# Patient Record
Sex: Male | Born: 1935 | Race: White | Hispanic: No | Smoking: Never smoker
Health system: Southern US, Community
[De-identification: ages and names within clinical notes are randomized; demographics above are authoritative.]

## PROBLEM LIST (undated history)

## (undated) DIAGNOSIS — I2510 Atherosclerotic heart disease of native coronary artery without angina pectoris: Secondary | ICD-10-CM

## (undated) DIAGNOSIS — F319 Bipolar disorder, unspecified: Secondary | ICD-10-CM

## (undated) DIAGNOSIS — F32A Depression, unspecified: Secondary | ICD-10-CM

## (undated) DIAGNOSIS — I10 Essential (primary) hypertension: Secondary | ICD-10-CM

## (undated) DIAGNOSIS — H269 Unspecified cataract: Secondary | ICD-10-CM

## (undated) DIAGNOSIS — G20A1 Parkinson's disease without dyskinesia, without mention of fluctuations: Secondary | ICD-10-CM

## (undated) DIAGNOSIS — I714 Abdominal aortic aneurysm, without rupture, unspecified: Secondary | ICD-10-CM

## (undated) DIAGNOSIS — G20 Parkinson's disease: Secondary | ICD-10-CM

## (undated) DIAGNOSIS — F329 Major depressive disorder, single episode, unspecified: Secondary | ICD-10-CM

## (undated) DIAGNOSIS — F419 Anxiety disorder, unspecified: Secondary | ICD-10-CM

## (undated) DIAGNOSIS — K219 Gastro-esophageal reflux disease without esophagitis: Secondary | ICD-10-CM

## (undated) DIAGNOSIS — W19XXXA Unspecified fall, initial encounter: Secondary | ICD-10-CM

## (undated) DIAGNOSIS — G309 Alzheimer's disease, unspecified: Secondary | ICD-10-CM

## (undated) DIAGNOSIS — E785 Hyperlipidemia, unspecified: Secondary | ICD-10-CM

## (undated) DIAGNOSIS — F0280 Dementia in other diseases classified elsewhere without behavioral disturbance: Secondary | ICD-10-CM

## (undated) HISTORY — PX: CATARACT EXTRACTION: SUR2

---

## 2009-12-26 ENCOUNTER — Emergency Department (HOSPITAL_BASED_OUTPATIENT_CLINIC_OR_DEPARTMENT_OTHER): Admission: EM | Admit: 2009-12-26 | Discharge: 2009-12-26 | Payer: Self-pay | Admitting: Emergency Medicine

## 2009-12-28 ENCOUNTER — Emergency Department (HOSPITAL_BASED_OUTPATIENT_CLINIC_OR_DEPARTMENT_OTHER): Admission: EM | Admit: 2009-12-28 | Discharge: 2009-12-28 | Payer: Self-pay | Admitting: Emergency Medicine

## 2009-12-28 ENCOUNTER — Ambulatory Visit: Payer: Self-pay | Admitting: Diagnostic Radiology

## 2010-12-16 ENCOUNTER — Observation Stay (HOSPITAL_COMMUNITY)
Admission: EM | Admit: 2010-12-16 | Discharge: 2010-12-18 | Disposition: A | Discharge status: Skilled Nursing Facility | Payer: Medicare Other | Attending: Internal Medicine | Admitting: Internal Medicine

## 2010-12-16 ENCOUNTER — Emergency Department (HOSPITAL_COMMUNITY): Payer: Medicare Other

## 2010-12-16 DIAGNOSIS — F0390 Unspecified dementia without behavioral disturbance: Secondary | ICD-10-CM | POA: Insufficient documentation

## 2010-12-16 DIAGNOSIS — N400 Enlarged prostate without lower urinary tract symptoms: Secondary | ICD-10-CM | POA: Insufficient documentation

## 2010-12-16 DIAGNOSIS — M6281 Muscle weakness (generalized): Secondary | ICD-10-CM | POA: Insufficient documentation

## 2010-12-16 DIAGNOSIS — F411 Generalized anxiety disorder: Secondary | ICD-10-CM | POA: Insufficient documentation

## 2010-12-16 DIAGNOSIS — E785 Hyperlipidemia, unspecified: Secondary | ICD-10-CM | POA: Insufficient documentation

## 2010-12-16 DIAGNOSIS — R079 Chest pain, unspecified: Principal | ICD-10-CM | POA: Insufficient documentation

## 2010-12-16 DIAGNOSIS — R9431 Abnormal electrocardiogram [ECG] [EKG]: Secondary | ICD-10-CM | POA: Insufficient documentation

## 2010-12-16 DIAGNOSIS — F259 Schizoaffective disorder, unspecified: Secondary | ICD-10-CM | POA: Insufficient documentation

## 2010-12-16 DIAGNOSIS — G20A1 Parkinson's disease without dyskinesia, without mention of fluctuations: Secondary | ICD-10-CM | POA: Insufficient documentation

## 2010-12-16 DIAGNOSIS — E781 Pure hyperglyceridemia: Secondary | ICD-10-CM | POA: Insufficient documentation

## 2010-12-16 DIAGNOSIS — R5381 Other malaise: Secondary | ICD-10-CM | POA: Insufficient documentation

## 2010-12-16 DIAGNOSIS — R7309 Other abnormal glucose: Secondary | ICD-10-CM | POA: Insufficient documentation

## 2010-12-16 DIAGNOSIS — G20 Parkinson's disease: Secondary | ICD-10-CM | POA: Insufficient documentation

## 2010-12-16 DIAGNOSIS — K2270 Barrett's esophagus without dysplasia: Secondary | ICD-10-CM | POA: Insufficient documentation

## 2010-12-16 DIAGNOSIS — Z79899 Other long term (current) drug therapy: Secondary | ICD-10-CM | POA: Insufficient documentation

## 2010-12-16 DIAGNOSIS — I10 Essential (primary) hypertension: Secondary | ICD-10-CM | POA: Insufficient documentation

## 2010-12-16 LAB — DIFFERENTIAL
Lymphs Abs: 2.6 10*3/uL (ref 0.7–4.0)
Neutro Abs: 3.5 10*3/uL (ref 1.7–7.7)
Neutrophils Relative %: 51 % (ref 43–77)

## 2010-12-16 LAB — COMPREHENSIVE METABOLIC PANEL
AST: 16 U/L (ref 0–37)
Albumin: 3.6 g/dL (ref 3.5–5.2)
CO2: 27 mEq/L (ref 19–32)
Calcium: 10.1 mg/dL (ref 8.4–10.5)
GFR calc non Af Amer: >60 mL/min (ref >60)
Potassium: 4 mEq/L (ref 3.5–5.1)
Sodium: 140 mEq/L (ref 135–145)

## 2010-12-16 LAB — CK TOTAL AND CKMB (NOT AT ARMC)
Relative Index: INVALID (ref 0.0–2.5)
Relative Index: INVALID (ref 0.0–2.5)
Total CK: 26 U/L (ref 7–232)
Total CK: 26 U/L (ref 7–232)

## 2010-12-16 LAB — CBC
HCT: 40.2 % (ref 39.0–52.0)
Hemoglobin: 13.6 g/dL (ref 13.0–17.0)
MCH: 32.3 pg (ref 26.0–34.0)
MCV: 95.5 fL (ref 78.0–100.0)
Platelets: 149 10*3/uL — ABNORMAL LOW (ref 150–400)
RDW: 13.7 % (ref 11.5–15.5)

## 2010-12-16 LAB — CARDIAC PANEL(CRET KIN+CKTOT+MB+TROPI): CK, MB: 1.8 ng/mL (ref 0.3–4.0)

## 2010-12-16 LAB — URINALYSIS, ROUTINE W REFLEX MICROSCOPIC
Glucose, UA: NEGATIVE mg/dL
Nitrite: NEGATIVE
Protein, ur: NEGATIVE mg/dL

## 2010-12-16 LAB — TROPONIN I: Troponin I: <0.3 ng/mL (ref <0.30)

## 2010-12-17 LAB — BASIC METABOLIC PANEL
BUN: 21 mg/dL (ref 6–23)
CO2: 26 mEq/L (ref 19–32)
Creatinine, Ser: 0.92 mg/dL (ref 0.50–1.35)
GFR calc Af Amer: >60 mL/min (ref >60)

## 2010-12-17 LAB — DIFFERENTIAL
Basophils Relative: 0 % (ref 0–1)
Monocytes Absolute: 0.4 10*3/uL (ref 0.1–1.0)
Monocytes Relative: 7 % (ref 3–12)
Neutro Abs: 3.3 10*3/uL (ref 1.7–7.7)
Neutrophils Relative %: 50 % (ref 43–77)

## 2010-12-17 LAB — HEMOGLOBIN A1C
Hgb A1c MFr Bld: 6 % — ABNORMAL HIGH (ref <5.7)
Mean Plasma Glucose: 126 mg/dL — ABNORMAL HIGH (ref <117)

## 2010-12-17 LAB — LIPID PANEL
HDL: 33 mg/dL — ABNORMAL LOW (ref >39)
LDL Cholesterol: 124 mg/dL — ABNORMAL HIGH (ref 0–99)
Total CHOL/HDL Ratio: 6.3 RATIO
Triglycerides: 258 mg/dL — ABNORMAL HIGH (ref <150)
VLDL: 52 mg/dL — ABNORMAL HIGH (ref 0–40)

## 2010-12-17 LAB — CBC
HCT: 38 % — ABNORMAL LOW (ref 39.0–52.0)
Hemoglobin: 12.8 g/dL — ABNORMAL LOW (ref 13.0–17.0)
MCH: 32.6 pg (ref 26.0–34.0)
MCV: 96.7 fL (ref 78.0–100.0)
RBC: 3.93 MIL/uL — ABNORMAL LOW (ref 4.22–5.81)
RDW: 13.9 % (ref 11.5–15.5)
WBC: 6.7 10*3/uL (ref 4.0–10.5)

## 2010-12-17 LAB — CARDIAC PANEL(CRET KIN+CKTOT+MB+TROPI)
Relative Index: INVALID (ref 0.0–2.5)
Total CK: 19 U/L (ref 7–232)
Troponin I: <0.3 ng/mL (ref <0.30)

## 2010-12-17 NOTE — H&P (Signed)
NAME:  Andre Marsh, Andre Marsh NO.:  [id number]  MEDICAL RECORD NO.:  [id number]  LOCATION:  1431                         FACILITY:  [facility]  PHYSICIAN:  Jeoffrey Massed, MD    DATE OF BIRTH:  18-Feb-1927  DATE OF ADMISSION:  12/16/2010 DATE OF DISCHARGE:                             HISTORY & PHYSICAL   PRIMARY CARE PRACTITIONER:  At assisted living facility is Karlene Einstein, MD  CHIEF COMPLAINT:  Intermittent chest pain for the past 2 weeks.  HISTORY OF PRESENT ILLNESS:  The patient is an 77 year old white male with a past medical history of Barrett esophagus, hypertension, Parkinson disease, schizoaffective disorder who has been having intermittent chest pain for the past 2 weeks.  Please note that this patient does have some mild to moderate dementia and history is unreliable, but most of this history is obtained after talking to the patient's daughter and also from the ED chart.  Per the history obtained, apparently the patient has been having chest pain on an intermittent basis as noted above.  Upon repeatedly asking the patient and the patient's daughter, it is unclear whether this is actually related to exertion or happens at rest.  Per the patient, there are times when he has had chest pain on rest and there are times when he claims he just feels tired and is slightly winded after exertion.  Upon repeatedly asking, the patient claims that his chest pain is on the left side of his chest.  At its worst, it is around 3/10 to 5/10 with no radiation and with no associated nausea, vomiting, palpitations or diaphoresis.  The last time the patient had chest pain was yesterday and he is currently chest pain-free.  The daughter has been trying to get the patient to seek medical attention for it for the past 2 weeks, but the patient has subsequently refused and subsequently agreed today and was then brought to the ED for further evaluation.  The patient  denies headache, blurry vision, shortness of breath at rest, abdominal pain, nausea, vomiting, diarrhea or dysuria.  ALLERGIES:  Apparently, the patient is allergic to TETRACYCLINE, PENICILLIN, CIPROFLOXACIN, CODEINE and SULFA medications.  PAST MEDICAL HISTORY:  Significant for: 1. 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 2. Schizoaffective disorder. 3. Anxiety. 4. Dyslipidemia. 5. Hypertension. 6. Parkinson disease. 7. Barrett esophagus. 8. BPH.  PAST SURGICAL HISTORY:  Cholecystectomy.  MEDICATIONS PRIOR TO ADMISSION: 1. Zolpidem 5 mg 1 tablet q.h.s. p.r.n. 2. Lorazepam 0.5 mg 1 tablet every 12 hours p.r.n. 3. Albuterol nebulizer one nebulizer inhaled q.4 h p.r.n. 4. Terazosin 5 mg 1 capsule at bedtime. 5. Senokot 1 tablet twice daily. 6. Risperdal 0.25 mg 1 tablet twice daily. 7. Omeprazole 20 mg 1 tablet twice daily. 8. Mirtazapine 7.5 mg 1 tablet daily at bedtime. 9. Metoprolol-XL 25 mg 1 tablet daily. 10.Lamictal 100 mg 1 tablet twice daily. 11.Galantamine 8 mg 1 capsule daily. 12.Folic acid 1 mg 1 tablet daily. 13.Budesonide 3 mg oral 3 capsules daily.  FAMILY HISTORY:  Noncontributory.  However, the patient's mother did apparently have congestive heart failure.  SOCIAL HISTORY:  Lives at Assisted Living Facility and denies any toxic habits.  REVIEW OF SYSTEMS:  A detailed review of 12 systems was done and these are negative except for the ones noted in the HPI.  PHYSICAL EXAMINATION:  GENERAL:  Lying in bed, does not appear to be in any distress.  He is awake and mostly alert.  Speech is clear.  Follows most of my commands and answers most of my questions appropriately. VITAL SIGNS:  Temperature of 98.4, heart rate of 69, blood pressure of 119/69, respirations of 20. HEENT:  Atraumatic, normocephalic.  Pupils equally react to light and accommodation. NECK:  Supple. CHEST:  Bilaterally clear to auscultation. CARDIOVASCULAR:  Heart sounds are regular.  No  murmurs heard. ABDOMEN:  Soft, nontender, nondistended. EXTREMITIES:  Show no edema. NEUROLOGIC:  The patient is awake and alert and does not appear to have any focal neurological deficits.  LABORATORY DATA: 1. CBC shows a WBC of 6.8, hemoglobin 13.6, hematocrit of 40.2 and     platelet count of 149. 2. Urinalysis is unremarkable. 3. First set of troponins is negative. 4. Chemistries showed a sodium of 140, potassium of 4.0, chloride of     103, bicarb of 27, glucose of 88, BUN of 18, creatinine of 0.84 and     a calcium of 10.1. 5. LFTs show a total bilirubin of 0.3, alkaline phosphatase of 77, AST     of 16, ALT of 15, total protein of 6.6 and albumin of 3.6.  RADIOLOGICAL STUDIES: 1. A chest x-ray 2-view showed no acute cardiopulmonary abnormalities.     Bilateral lower lobe reticular interstitial markings suggesting     pulmonary fibrosis. 2. EKG shows normal sinus rhythm with some nonspecific ST changes.  ASSESSMENT: 1. Chest pain, rule out unstable angina versus secondary to Barrett     esophagitis or reflux esophagitis. 2. Mild-to-moderate dementia, currently at baseline. 3. History of Parkinson disease. 4. History of hypertension, currently stable. 5. History of schizoaffective disorder, currently stable.  PLAN: 1. The patient will be brought in as an observation and will be     admitted to the telemetry unit. 2. Cardiac enzymes will be cycled. 3. The patient will be based on aspirin. 4. A fasting lipid profile will be obtained and an Hb A1c as well. 5. A 2-D echocardiogram will be obtained. 6. Further plan will depend as the patient's clinical course evolves. 7. Long discussions were done with the patient, but mainly with the     daughter and given the patient's numerous underlying medical     comorbidities, she would prefer medical management unless something     was very grossly abnormal on the echo or if cardiac enzymes were     strongly positive.  In that case,  she would probably prefer     cardiology evaluation for further discussion before embarking on an     elaborate workup. We will admit this patient and follow his clinical      course and make further plans depending on that. 8. All of his usual medications will be continued.  Please see orders     as written. 9. Code status.  A long discussion was also held and for now, the     patient will be a full code and the daughter claims that he does     have a living will and advance directives in place and will bring     it to the hospital for further clarification.  TIME SPENT:  Total time spent coordinating admission process was 45 minutes.     Telesha Deguzman  Jerral Ralph, MD     SG/MEDQ  D:  12/16/2010  T:  12/16/2010  Job:  [id number]  cc:   Karlene Einstein, M.D. Fax: [phone]  Electronically Signed by Jeoffrey Massed  on 12/17/2010 03:32:03 PM

## 2011-01-02 NOTE — Discharge Summary (Signed)
NAMEMarland Marsh  [name], [name]:  [id number]  MEDICAL RECORD NO.:  [id number]  LOCATION:  1431                         FACILITY:  [facility]  PHYSICIAN:  Ladell Pier, M.D.   DATE OF BIRTH:  1926-12-12  DATE OF ADMISSION:  12/16/2010 DATE OF DISCHARGE:  12/18/2010                              DISCHARGE SUMMARY   DISCHARGE DIAGNOSES: 1. Chest pain with negative cardiac markers.  Negative 2-D echo.  The     patient to follow up outpatient with cardiology and with primary     care physician. 2. Mild to moderate dementia. 3. Schizoaffective disorder. 4. Anxiety. 5. Dyslipidemia. 6. Hypertension. 7. Parkinson's disease. 8. Barrett's esophagus. 9. Enlarged prostate - benign prostatic hypertrophy. 10.Glucose intolerance with hemoglobin A1c of 6.0. 11.Hypertriglyceridemia.  DISCHARGE MEDICATIONS: 1. Aspirin 325 mg daily. 2. Albuterol nebulizer every 4 hours as needed for shortness of     breath. 3. Budesonide 3 mg 3 capsules daily. 4. Folic acid 1 mg daily. 5. Galantamine 8 mg daily. 6. Lamictal 100 mg one at 10:00 a.m. and one at 2:00 p.m. 7. Lorazepam 0.5 mg every 12 hours as needed. 8. Metoprolol XL 25 mg daily. 9. Mirtazapine 7.5 mg at bedtime. 10.Omeprazole 20 mg twice daily. 11.Risperidone 0.25 mg, take one at 8:00 a.m. and one at 8:00 p.m. 12.Senokot/docusate 8.6/50, take one at 8:00 a.m. and one at 8:00 p.m. 13.Terazosin 5 mg at bedtime. 14.Ambien 5 mg q.h.s. p.r.n.  FOLLOWUP APPOINTMENTS:  The patient to follow up with Dr. Kerry Dory in 1 to 2 weeks.  The patient again scheduled outpatient followup with cardiology.  PROCEDURES:  2-D echo done on July 3 showed cavity size normal, moderate LVH, systolic function was normal in the range of 55% to 60%.  Wall motion was normal.  No regional wall motion abnormalities.  Grade 1 diastolic dysfunction.  Chest x-ray, no acute cardiopulmonary abnormalities.  Bilateral lower lobe reticular interstitial  markings, suggesting pulmonary fibrosis.  CONSULTANTS:  None.  HISTORY OF PRESENT ILLNESS:  Please see dictated H&P.  The patient is an 77 year old white male with past medical history significant for Barrett's esophagus, hypertension, Parkinson's disease, schizoaffective disorder.  The patient complained of intermittent chest pain for 2 weeks.  The patient states that sometimes the pain is with activity and sometimes is at rest.  He complains of feeling tired and slightly winded after exertion sometimes.  The patient on admission was chest pain-free. He was admitted for rule out MI.  PAST MEDICAL HISTORY:  Per admission H&P.  FAMILY HISTORY:  Per admission H&P.  SOCIAL HISTORY:  Per admission H&P.  MEDICATIONS:  Per admission H&P.  ALLERGIES:  Per admission H&P.  REVIEW OF SYSTEMS:  Per admission H&P.  PHYSICAL EXAMINATION AT THE TIME OF DISCHARGE:  VITAL SIGNS: Temperature 97.8, pulse 57, respirations 16 blood pressure 152/74, pulse ox 96% on room air. GENERAL:  The patient is lying in bed, well-nourished white male. HEENT:  Normocephalic, atraumatic.  Pupils reactive to light without erythema. CARDIOVASCULAR:  Regular rate and rhythm. LUNGS:  Clear bilaterally. ABDOMEN:  Positive bowel sounds. EXTREMITIES:  No edema.  HOSPITAL COURSE: 1. Chest pain:  The patient was admitted with  chest pain ruled out.     Cardiac markers are negative.  His 2-D echo does not show any wall     motion abnormality.  The patient is presently chest pain-free.  He     will be discharged home.  He can follow up outpatient with     cardiology and with his primary care physician. 2. Hypertension.  Continue home meds. 3. Schizoaffective disorder.  Continue home meds. 4. High triglycerides:  The patient can be started on fish oil when he     follows up at the nursing home but will defer that to primary care     physician.  DISCHARGE LABS:  Hemoglobin A1c 6.0.  Plasma glucose 126,  total cholesterol of 209, triglycerides 258, HDL 33, LDL of 124.  CK 19, MB 1.5, troponin less than 0.03.  Sodium 134, potassium 4.1, chloride 102, CO2 26, glucose 102, BUN 21, creatinine 0.92.  WBC 6.7, hemoglobin 12.8, MCV 96.7, platelets 157,000.  Time spent with the patient and doing this discharge is approximately 45 minutes.     Ladell Pier, M.D.     [place]/MEDQ  D:  12/18/2010  T:  12/18/2010  Job:  [id number]  Electronically Signed by Ladell Pier M.D. on 01/02/2011 10:59:43 PM

## 2012-09-02 ENCOUNTER — Non-Acute Institutional Stay (SKILLED_NURSING_FACILITY): Payer: Medicare Other | Admitting: Internal Medicine

## 2012-09-02 DIAGNOSIS — R0600 Dyspnea, unspecified: Secondary | ICD-10-CM

## 2012-09-02 DIAGNOSIS — I10 Essential (primary) hypertension: Secondary | ICD-10-CM

## 2012-09-02 DIAGNOSIS — R079 Chest pain, unspecified: Secondary | ICD-10-CM

## 2012-09-02 DIAGNOSIS — R0989 Other specified symptoms and signs involving the circulatory and respiratory systems: Secondary | ICD-10-CM

## 2012-09-06 NOTE — Progress Notes (Signed)
Patient ID: Andre Marsh, male   DOB: 03-Mar-1927, 79 y.o.   MRN: [id number]           PROGRESS NOTE  DATE:  09/02/2012  FACILITY: [name]   LEVEL OF CARE: SNF  Acute Visit  CHIEF COMPLAINT:  Manage chest pain.    HISTORY OF PRESENT ILLNESS: I was requested by the patient to assess him secondary to complaints of chest pain and shortness of breath.  Per patient, symptoms have been present for several days constantly, 24 hours/7 days a week.  He denies diaphoresis, nausea or vomiting.  The patient had a cardiac catheterization in October 2013.    REVIEW OF SYSTEMS: GENERAL: no change in appetite, no fatigue, no weight changes, no fever, chills or weakness RESPIRATORY: see HPI CARDIAC: see HPI GI: no abdominal pain, diarrhea, constipation, heart burn, nausea or vomiting  PHYSICAL EXAMINATION  VS:  T  97.6       P  58           RR  18         BP  156/76             POX %                WT (Lb)  GENERAL: no acute distress, normal body habitus RESPIRATORY: breathing is even & unlabored, BS CTAB CARDIAC: RRR, no murmur,no extra heart sounds, no edema GI: abdomen soft, normal BS, no masses, no tenderness, no hepatomegaly, no splenomegaly NECK/THYROID:   Supple.  No elevation of the jugular venous pulsation.  Trachea midline.  No neck masses.  No thyroid tenderness.  No thyroid nodule.  No thyroid enlargement.   ASSESSMENT/PLAN:  Chest pain and shortness of breath (            ).  Not sure symptoms are real.  Patient has frequent chest pain complaints.  In October 2013, he had a cardiac catheterization.  At that time, his nuclear stress test was negative for ischemia.  Therefore, further workup was not recommended.  We will give Mylanta 30 cc and nitroglycerin 0.6 mg one-time dose.    Hypertension 401.1.  Blood pressure is elevated now, likely secondary to anxiety.  Overall, blood pressures are normal.      CPT CODE: 40981.

## 2012-09-09 ENCOUNTER — Non-Acute Institutional Stay (SKILLED_NURSING_FACILITY): Payer: Medicare Other | Admitting: Internal Medicine

## 2012-09-09 DIAGNOSIS — K5900 Constipation, unspecified: Secondary | ICD-10-CM

## 2012-09-09 DIAGNOSIS — R079 Chest pain, unspecified: Secondary | ICD-10-CM

## 2012-09-15 NOTE — Progress Notes (Signed)
Patient ID: Andre Marsh, male   DOB: [birth year], 79 y.o.   MRN: [id number]        PROGRESS NOTE  DATE:   09/09/2012  FACILITY:  [name]   LEVEL OF CARE: SNF  Acute Visit  CHIEF COMPLAINT:  Manage chest pain and constipation.   HISTORY OF PRESENT ILLNESS: I was requested by the staff to assess the patient regarding above problem(s):    Chest pain and shortness of breath.  Patient is complaining of ongoing chest pain and shortness of breath and insists on a Cardiology consultation.   In October 2013, patient had a Cardiology consult for complaints of chest pain and shortness of breath.  At that time, nuclear scan was negative for ischemia and cardiologist did not recommend further workup.  Constipation.   Staff requested that patient be seen because he was requesting an increase in stool softener.  He had reported that he was having a difficult time with passing his stool.   Today patient denies constipation, stating he increased fiber in his diet.    PAST MEDICAL HISTORY : Reviewed.  No changes.  CURRENT MEDICATIONS: Reviewed per [facility]  REVIEW OF SYSTEMS:  GENERAL: no change in appetite, no fatigue, no weight changes, no fever, chills or weakness RESPIRATORY: see HPI, no cough, DOE, wheezing, hemoptysis CARDIAC: see HPI, no edema or palpitations GI: no abdominal pain, diarrhea, constipation, heart burn, nausea or vomiting  PHYSICAL EXAMINATION  GENERAL: no acute distress, normal body habitus RESPIRATORY: breathing is even & unlabored, BS CTAB CARDIAC: RRR, no murmur,no extra heart sounds, no edema GI: abdomen soft, normal BS, no masses, no tenderness, no hepatomegaly, no splenomegaly PSYCHIATRIC: the patient is alert & oriented to person, affect & behavior appropriate   ASSESSMENT/PLAN:  Chest pain and shortness of breath.  Recurrent complaint, likely psychosomatic.  Patient insists on a Cardiology consult.  Therefore, we will refer.    Constipation.   Apparently no further  concerns.   CPT CODE: 40981.

## 2012-09-24 ENCOUNTER — Other Ambulatory Visit: Payer: Self-pay | Admitting: *Deleted

## 2012-09-24 MED ORDER — ZOLPIDEM TARTRATE 5 MG PO TABS: ORAL_TABLET | ORAL | Status: DC

## 2012-10-07 ENCOUNTER — Non-Acute Institutional Stay (SKILLED_NURSING_FACILITY): Payer: Medicare Other | Admitting: Internal Medicine

## 2012-10-07 DIAGNOSIS — E7800 Pure hypercholesterolemia, unspecified: Secondary | ICD-10-CM

## 2012-10-07 DIAGNOSIS — I10 Essential (primary) hypertension: Secondary | ICD-10-CM

## 2012-10-07 DIAGNOSIS — J309 Allergic rhinitis, unspecified: Secondary | ICD-10-CM

## 2012-10-07 DIAGNOSIS — K5900 Constipation, unspecified: Secondary | ICD-10-CM

## 2012-10-22 DIAGNOSIS — K5900 Constipation, unspecified: Secondary | ICD-10-CM | POA: Insufficient documentation

## 2012-10-22 DIAGNOSIS — J309 Allergic rhinitis, unspecified: Secondary | ICD-10-CM | POA: Insufficient documentation

## 2012-10-22 DIAGNOSIS — I10 Essential (primary) hypertension: Secondary | ICD-10-CM | POA: Insufficient documentation

## 2012-10-22 DIAGNOSIS — E7800 Pure hypercholesterolemia, unspecified: Secondary | ICD-10-CM | POA: Insufficient documentation

## 2012-10-22 NOTE — Progress Notes (Signed)
Patient ID: Andre Marsh, male   DOB: 05-01-1927, 79 y.o.   MRN: [id number]        PROGRESS NOTE  DATE:  10/07/2012  FACILITY: [name]   LEVEL OF CARE: SNF  Routine Visit  CHIEF COMPLAINT:  Manage constipation, hyperlipidemia and hypertension.  HISTORY OF PRESENT ILLNESS:  REASSESSMENT OF ONGOING PROBLEM(S):  CONSTIPATION: The patient is requesting a stool softener despite being on senna.  The constipation remains stable. No complications from the medications presently being used. Patient denies ongoing constipation, abdominal pain, nausea or vomiting.   HTN: Pt 's HTN remains stable.  Denies CP, sob, DOE, pedal edema, headaches, dizziness or visual disturbances.  No complications from the medications currently being used.  Last BP : 125/69, 132/68, 141/66.  HYPERLIPIDEMIA: No complications from the medications presently being used. Last fasting lipid panel showed : On 09/30/2012:  Total cholesterol 202, LDL 132, triglycerides 141, HDL 42.  Patient does not have a history of coronary artery disease and cardiac work-up was negative in October 2013.   PAST MEDICAL HISTORY : Reviewed.  No changes.  CURRENT MEDICATIONS: Reviewed per [facility]  REVIEW OF SYSTEMS:  GENERAL: no change in appetite, no fatigue, no weight changes, no fever, chills or weakness RESPIRATORY: no cough, SOB, DOE, wheezing, hemoptysis CARDIAC: no chest pain, edema or palpitations GI: complains of uncontrolled constipation; no abdominal pain, diarrhea, heart burn, nausea or vomiting  PHYSICAL EXAMINATION  VS:  T 98.5     P 57     RR 16    BP 125/69     POX %     WT (Lb) 129.4  GENERAL: no acute distress, normal body habitus EYES: conjunctivae normal, sclerae normal, normal eye lids NECK: supple, trachea midline, no neck masses, no thyroid tenderness, no thyromegaly LYMPHATICS: no LAN in the neck, no supraclavicular LAN RESPIRATORY: breathing is even & unlabored, BS CTAB CARDIAC: RRR, no murmur,no extra heart  sounds, no edema GI: abdomen soft, normal BS, no masses, no tenderness, no hepatomegaly, no splenomegaly PSYCHIATRIC: the patient is alert & oriented to person, affect & behavior appropriate  LABS/RADIOLOGY: 09/2012:   TSH 3.376.  07/2012:  Total protein 5.2, otherwise liver profile normal.    05/2012:  Vitamin B12 level 333, folate greater than 20.    Hemoglobin A1C 6.    04/2012:  Iron panel normal.   CBC normal.   BMP normal.   ASSESSMENT/PLAN:  Constipation.  Per patient, uncontrolled.  Add colace 100 mg q.d.   Hyperlipidemia.  LDL unchanged from previous and hypertension is the only coronary risk factor.  Therefore, no need to add a statin.    Hypertension.  Well controlled.   Allergic rhinitis.  No complaints.   Alzheimer's dementia.  Stable.   BPH.  No complaints.    Bipolar disorder.  Continue current medications.  Followed by Psychiatry.    GERD.  Well controlled.    CPT CODE: 82956

## 2012-10-28 ENCOUNTER — Non-Acute Institutional Stay (SKILLED_NURSING_FACILITY): Payer: Medicare Other | Admitting: Internal Medicine

## 2012-10-28 DIAGNOSIS — M25539 Pain in unspecified wrist: Secondary | ICD-10-CM

## 2012-10-28 DIAGNOSIS — M25531 Pain in right wrist: Secondary | ICD-10-CM

## 2012-11-16 ENCOUNTER — Non-Acute Institutional Stay (SKILLED_NURSING_FACILITY): Payer: Medicare Other | Admitting: Internal Medicine

## 2012-11-16 DIAGNOSIS — K5900 Constipation, unspecified: Secondary | ICD-10-CM

## 2012-11-16 DIAGNOSIS — E7800 Pure hypercholesterolemia, unspecified: Secondary | ICD-10-CM

## 2012-11-16 DIAGNOSIS — J309 Allergic rhinitis, unspecified: Secondary | ICD-10-CM

## 2012-11-16 DIAGNOSIS — I10 Essential (primary) hypertension: Secondary | ICD-10-CM

## 2012-11-18 NOTE — Progress Notes (Signed)
Patient ID: Andre Marsh, male   DOB: 1927-02-26, 79 y.o.   MRN: [id number]        PROGRESS NOTE  DATE: 10/28/2012  FACILITY:  [name]  LEVEL OF CARE: SNF (31)  Acute Visit  CHIEF COMPLAINT:  Manage right wrist pain.    HISTORY OF PRESENT ILLNESS: I was requested by the staff to assess the patient regarding above problem(s):  Patient is complaining of right wrist pain today.  He says the pain is constant and there is stiffness in his fingers.  He denies any injury.  He cannot identify precipitating or alleviating factors.  There is no temporal relationship.    PAST MEDICAL HISTORY : Reviewed.  No changes.  CURRENT MEDICATIONS: Reviewed per [facility]  REVIEW OF SYSTEMS:  GENERAL: no change in appetite, no fatigue, no weight changes, no fever, chills or weakness RESPIRATORY: no cough, SOB, DOE,, wheezing, hemoptysis CARDIAC: no chest pain, edema or palpitations GI: no abdominal pain, diarrhea, constipation, heart burn, nausea or vomiting  PHYSICAL EXAMINATION  VS:  T 98.1        P 72       RR 20      BP 120/74     POX %       WT (Lb)  GENERAL: no acute distress, normal body habitus RESPIRATORY: breathing is even & unlabored, BS CTAB CARDIAC: RRR, no murmur,no extra heart sounds, no edema GI: abdomen soft, normal BS, no masses, no tenderness, no hepatomegaly, no splenomegaly MUSCULOSKELETAL:   EXTREMITIES:   RIGHT UPPER EXTREMITY: right wrist is tender to palpation, range of motion testing exacerbates pain; there is no edema, erythema or warmth  ASSESSMENT/PLAN:  Right wrist pain.  New problem.  The patient is not responding to tramadol.  Therefore, we will use Naprosyn 500 mg b.i.d. for 10 days with food.   CPT CODE: 78295

## 2012-11-18 NOTE — Progress Notes (Signed)
Patient ID: Andre Marsh, male   DOB: [birth year], 79 y.o.   MRN: [id number]        PROGRESS NOTE  DATE:  11/16/2012  FACILITY: [name]   LEVEL OF CARE: SNF  Routine Visit  CHIEF COMPLAINT:  Manage constipation, hyperlipidemia and hypertension.  HISTORY OF PRESENT ILLNESS:  REASSESSMENT OF ONGOING PROBLEM(S):  CONSTIPATION:  The constipation remains stable. No complications from the medications presently being used. Patient denies ongoing constipation, abdominal pain, nausea or vomiting.   HTN: Pt 's HTN remains stable.  Denies CP, sob, DOE, pedal edema, headaches, dizziness or visual disturbances.  No complications from the medications currently being used.  Last BP : 159/75, 125/69, 132/68, 141/66.  HYPERLIPIDEMIA: No complications from the medications presently being used. Last fasting lipid panel showed : On 09/30/2012:  Total cholesterol 202, LDL 132, triglycerides 141, HDL 42.  Patient does not have a history of coronary artery disease and cardiac work-up was negative in October 2013.   PAST MEDICAL HISTORY : Reviewed.  No changes.  CURRENT MEDICATIONS: Reviewed per [facility]  REVIEW OF SYSTEMS:  GENERAL: no change in appetite, no fatigue, no weight changes, no fever, chills or weakness RESPIRATORY: no cough, SOB, DOE, wheezing, hemoptysis CARDIAC: no chest pain, edema or palpitations GI: no constipation; no abdominal pain, diarrhea, heart burn, nausea or vomiting  PHYSICAL EXAMINATION  VS:  T 98.2     P 53     RR 16    BP 159/75     POX %     WT (Lb) 128.8  GENERAL: no acute distress, normal body habitus NECK: supple, trachea midline, no neck masses, no thyroid tenderness, no thyromegaly RESPIRATORY: breathing is even & unlabored, BS CTAB CARDIAC: RRR, no murmur,no extra heart sounds, no edema GI: abdomen soft, normal BS, no masses, no tenderness, no hepatomegaly, no splenomegaly PSYCHIATRIC: the patient is alert & oriented to person, affect & behavior  appropriate  LABS/RADIOLOGY: 09/2012:   TSH 3.376.  07/2012:  Total protein 5.2, otherwise liver profile normal.    05/2012:  Vitamin B12 level 333, folate greater than 20.    Hemoglobin A1C 6.    04/2012:  Iron panel normal.   CBC normal.   BMP normal.   ASSESSMENT/PLAN:  Constipation.  Denies ongoing symptoms.  Hyperlipidemia.  LDL unchanged from previous and hypertension is the only coronary risk factor.  Therefore, no need to add a statin.    Hypertension.  Last  BP elevated. We'll review a log.  Allergic rhinitis.  No complaints.   Alzheimer's dementia.  Stable.   BPH.  No complaints.    Bipolar disorder.  Continue current medications.  Followed by Psychiatry.    GERD.  Well controlled.    Check CBC and BMP.  CPT CODE: 40981

## 2012-11-23 ENCOUNTER — Non-Acute Institutional Stay (SKILLED_NURSING_FACILITY): Payer: Medicare Other | Admitting: Internal Medicine

## 2012-11-23 DIAGNOSIS — R739 Hyperglycemia, unspecified: Secondary | ICD-10-CM

## 2012-11-23 DIAGNOSIS — R7309 Other abnormal glucose: Secondary | ICD-10-CM

## 2012-12-13 DIAGNOSIS — R739 Hyperglycemia, unspecified: Secondary | ICD-10-CM | POA: Insufficient documentation

## 2012-12-13 NOTE — Progress Notes (Signed)
Patient ID: Andre Marsh, male   DOB: 30-Jan-1927, 79 y.o.   MRN: [id number]        PROGRESS NOTE  DATE: 11/23/2012  FACILITY:  [name]  LEVEL OF CARE: SNF (31)  Acute Visit  CHIEF COMPLAINT:  Manage hyperglycemia.    HISTORY OF PRESENT ILLNESS: I was requested by the staff to assess the patient regarding above problem(s):  HYPERGLYCEMIA:  On 11/19/2012, patient's glucose level was 139.  He does not have a history of diabetes mellitus, nor is he on prednisone.  He denies polyuria or polydipsia.    PAST MEDICAL HISTORY : Reviewed.  No changes.  CURRENT MEDICATIONS: Reviewed per [facility]  REVIEW OF SYSTEMS:  GENERAL: no change in appetite, no fatigue, no weight changes, no fever, chills or weakness RESPIRATORY: no cough, SOB, DOE,, wheezing, hemoptysis CARDIAC: no chest pain, edema or palpitations GI: no abdominal pain, diarrhea, constipation, heart burn, nausea or vomiting  PHYSICAL EXAMINATION  GENERAL: no acute distress, normal body habitus NECK: supple, trachea midline, no neck masses, no thyroid tenderness, no thyromegaly RESPIRATORY: breathing is even & unlabored, BS CTAB CARDIAC: RRR, no murmur,no extra heart sounds, no edema GI: abdomen soft, normal BS, no masses, no tenderness, no hepatomegaly, no splenomegaly PSYCHIATRIC: the patient is alert & oriented to person, affect & behavior appropriate  ASSESSMENT/PLAN:  Hyperglycemia.  New problem.  Check fasting glucose level and  hemoglobin A1C.    CPT CODE: 40102

## 2013-01-04 ENCOUNTER — Non-Acute Institutional Stay (SKILLED_NURSING_FACILITY): Payer: Medicare Other | Admitting: Internal Medicine

## 2013-01-04 DIAGNOSIS — I10 Essential (primary) hypertension: Secondary | ICD-10-CM

## 2013-01-04 DIAGNOSIS — K5900 Constipation, unspecified: Secondary | ICD-10-CM

## 2013-01-04 DIAGNOSIS — E7800 Pure hypercholesterolemia, unspecified: Secondary | ICD-10-CM

## 2013-01-04 DIAGNOSIS — J309 Allergic rhinitis, unspecified: Secondary | ICD-10-CM

## 2013-01-07 NOTE — Progress Notes (Signed)
Patient ID: OTHA Marsh, male   DOB: 08-26-1926, 79 y.o.   MRN: [id number]        PROGRESS NOTE  DATE:  01/04/2013  FACILITY: [name]   LEVEL OF CARE: SNF  Routine Visit  CHIEF COMPLAINT:  Manage constipation, hyperlipidemia and hypertension.  HISTORY OF PRESENT ILLNESS:  REASSESSMENT OF ONGOING PROBLEM(S):  CONSTIPATION:  The constipation remains stable. No complications from the medications presently being used. Patient denies ongoing constipation, abdominal pain, nausea or vomiting.   HTN: Pt 's HTN remains stable.  Denies CP, sob, DOE, pedal edema, headaches, dizziness or visual disturbances.  No complications from the medications currently being used.  Last BP : 159/75, 125/69, 132/68, 141/66, 100/59.  HYPERLIPIDEMIA: No complications from the medications presently being used. Last fasting lipid panel showed : On 09/30/2012:  Total cholesterol 202, LDL 132, triglycerides 141, HDL 42. In 6/14 total cholesterol 206, triglycerides 198, HDL 36, LDL 130. Patient does not have a history of coronary artery disease and cardiac work-up was negative in October 2013.   PAST MEDICAL HISTORY : Reviewed.  No changes.  CURRENT MEDICATIONS: Reviewed per [facility]  REVIEW OF SYSTEMS:  GENERAL: no change in appetite, no fatigue, no weight changes, no fever, chills or weakness RESPIRATORY: no cough, SOB, DOE, wheezing, hemoptysis CARDIAC: no chest pain, edema or palpitations GI: no constipation; no abdominal pain, diarrhea, heart burn, nausea or vomiting  PHYSICAL EXAMINATION  VS:  T 98.2     P 51    RR 18    BP 100/59     POX %     WT (Lb) 128.8  GENERAL: no acute distress, normal body habitus NECK: supple, trachea midline, no neck masses, no thyroid tenderness, no thyromegaly RESPIRATORY: breathing is even & unlabored, BS CTAB CARDIAC: RRR, no murmur,no extra heart sounds, no edema GI: abdomen soft, normal BS, no masses, no tenderness, no hepatomegaly, no splenomegaly PSYCHIATRIC: the  patient is alert & oriented to person, affect & behavior appropriate  LABS/RADIOLOGY:  6/14 hemoglobin A1c 5.7, CBC normal, glucose 139 otherwise BMP normal, fasting glucose 81 09/2012:   TSH 3.376.  07/2012:  Total protein 5.2, otherwise liver profile normal.    05/2012:  Vitamin B12 level 333, folate greater than 20.    Hemoglobin A1C 6.    04/2012:  Iron panel normal.   CBC normal.   BMP normal.   ASSESSMENT/PLAN:  Constipation.  Denies ongoing symptoms.  Hyperlipidemia.  LDL unchanged from previous and hypertension is the only coronary risk factor.  Therefore, no need to add a statin.    Hypertension.  Well controlled.  Allergic rhinitis.  No complaints.   Alzheimer's dementia.  Stable.   BPH.  No complaints.    Bipolar disorder.  Continue current medications.  Followed by Psychiatry.    GERD.  Well controlled.    CPT CODE: 78295

## 2013-02-04 ENCOUNTER — Non-Acute Institutional Stay (SKILLED_NURSING_FACILITY): Payer: Medicare Other | Admitting: Nurse Practitioner

## 2013-02-04 DIAGNOSIS — K5900 Constipation, unspecified: Secondary | ICD-10-CM

## 2013-02-04 DIAGNOSIS — J309 Allergic rhinitis, unspecified: Secondary | ICD-10-CM

## 2013-02-04 DIAGNOSIS — K219 Gastro-esophageal reflux disease without esophagitis: Secondary | ICD-10-CM

## 2013-02-04 DIAGNOSIS — F319 Bipolar disorder, unspecified: Secondary | ICD-10-CM

## 2013-02-04 DIAGNOSIS — I10 Essential (primary) hypertension: Secondary | ICD-10-CM

## 2013-02-04 NOTE — Progress Notes (Signed)
Patient ID: Andre Marsh, male   DOB: [birth year], 79 y.o.   MRN: [id number]  Nursing Home Location:  [facility]   Place of Service: SNF [phone])  Chief Complaint  Patient presents with  . Medical Managment of Chronic Issues    HPI:  79 year old male who is a long term resident of [facility] is being seen today for routine follow up. Pt without any new complaints and staff without concerns.  REASSESSMENT OF ONGOING PROBLEMS:  MIXED HYPERLIPIDEMIA recent LDL 130 pt not on medications at this time BIPOLAR AFFECTIVE DISORDER  Stable; no complications noted from current medication. No increase in behaviors noted  HYPERTENSION, BENIGN The blood pressure readings taken outside the office since the last visit have been in the target range. However HR remains low CONSTIPATION The symptoms are stable.The medication is well tolerated. BPH W/O URINARY OBSTRUCTION The patient's BPH remains stable.No complications noted from the medication presently being used.  GERD: taking Prilosec reports acid reflux is stable    Review of Systems:   DATA OBTAINED: from patient, nurse, medical record GENERAL: Feels well no fevers, fatigue, appetite changes SKIN: No itching, rash or wounds EYES: No eye pain, redness, discharge EARS: No earache, tinnitus, change in hearing NOSE: No congestion, drainage or bleeding  MOUTH/THROAT: No mouth or tooth pain, No sore throat, No difficulty chewing or swallowing  RESPIRATORY: No cough, wheezing, SOB CARDIAC: ongoing complaints of chest pain-- cardiac workup neg reports it feels like an itch and  tramadol helps,  No palpitations, lower extremity edema  GI: No abdominal pain, No N/V/D or constipation, No heartburn or reflux  GU: No dysuria, frequency or urgency, or incontinence  MUSCULOSKELETAL: No unrelieved bone/joint pain NEUROLOGIC: Awake, alert, appropriate to situation, No change in mental status. Moves all four, no focal deficits PSYCHIATRIC:  No overt anxiety or sadness. Sleeps well. No behavior issue.    Medications: Patient's Medications  New Prescriptions   No medications on file  Previous Medications   ASPIRIN 81 MG CHEWABLE TABLET    Chew 81 mg by mouth daily.   BUDESONIDE (ENTOCORT EC) 3 MG 24 HR CAPSULE    Take 9 mg by mouth every morning.   DOCUSATE SODIUM (COLACE) 100 MG CAPSULE    Take 100 mg by mouth daily.   FOLIC ACID (FOLVITE) 1 MG TABLET    Take 1 mg by mouth daily.   GALANTAMINE (RAZADYNE ER) 8 MG 24 HR CAPSULE    Take 8 mg by mouth daily with breakfast.   HALOPERIDOL (HALDOL) 1 MG TABLET    Take 3 mg by mouth at bedtime.   ISOSORBIDE MONONITRATE (IMDUR) 30 MG 24 HR TABLET    Take 30 mg by mouth daily.   LAMOTRIGINE (LAMICTAL) 100 MG TABLET    Take 100 mg by mouth 2 (two) times daily.   LISINOPRIL (PRINIVIL,ZESTRIL) 20 MG TABLET    Take 20 mg by mouth daily.   LORATADINE (CLARITIN) 10 MG TABLET    Take 10 mg by mouth daily.   METOPROLOL SUCCINATE (TOPROL-XL) 50 MG 24 HR TABLET    Take 50 mg by mouth daily. Take with or immediately following a meal.   OMEPRAZOLE (PRILOSEC) 20 MG CAPSULE    Take 20 mg by mouth 2 (two) times daily.   SENNA-DOCUSATE (SENOKOT-S) 8.6-50 MG PER TABLET    Take 1 tablet by mouth 2 (two) times daily.   TAMSULOSIN (FLOMAX) 0.4 MG CAPS CAPSULE    Take  0.4 mg by mouth.   ZOLPIDEM (AMBIEN) 5 MG TABLET    Take 1/2 tablet at bedtime  Modified Medications   No medications on file  Discontinued Medications   No medications on file     Physical Exam:  Filed Vitals:   02/04/13 1340  BP: 116/70  Pulse: 54  Temp: 97 F (36.1 C)  Resp: 18  Weight: 125 lb (56.7 kg)     GENERAL APPEARANCE: Alert, conversant. Appropriately groomed. No acute distress.  SKIN: No diaphoresis rash, or wounds HEAD: Normocephalic, atraumatic  EYES: Conjunctiva/lids clear. Pupils round, reactive. EOMs intact.  EARS: External exam WNL, canals clear. Hearing grossly normal.  NOSE: No deformity or discharge.   MOUTH/THROAT: Lips w/o lesions. Mouth and throat normal. Tongue moist, w/o lesion.  NECK: No thyroid tenderness, enlargement or nodule  RESPIRATORY: Breathing is even, unlabored. Lung sounds are clear   CARDIOVASCULAR: Heart RRR no murmurs, rubs or gallops. No peripheral edema.  ARTERIAL: radial pulse 2+ GASTROINTESTINAL: Abdomen is soft, non-tender, not distended w/ normal bowel sounds. GENITOURINARY: Bladder non tender, not distended  MUSCULOSKELETAL: No abnormal joints or musculature NEUROLOGIC: . Moves all extremities no tremor. PSYCHIATRIC: Mood and affect appropriate to situation, no behavioral issues  Labs reviewed/Significant Diagnostic Results: Reviewed per chart  Assessment/Plan 1. Essential hypertension, benign Blood pressures are stable and well controlled however heart rate is staying in the low 50s. At this time will decrease metoprolol to 25 mg daily   2. Allergic rhinitis, cause unspecified Allergies are stable at this time  3. Unspecified constipation No complaints of worsening constipation will cont current medications  4. Bipolar disorder, unspecified Stable on current regimen   5. GERD (gastroesophageal reflux disease) Patients acid reflux is stable; continue current regimen. Will monitor and make changes as necessary.

## 2013-02-17 ENCOUNTER — Other Ambulatory Visit: Payer: Self-pay | Admitting: *Deleted

## 2013-02-17 MED ORDER — TRAMADOL HCL 50 MG PO TABS: 50.0000 mg | ORAL_TABLET | Freq: Three times a day (TID) | ORAL | Status: DC | PRN

## 2013-02-23 ENCOUNTER — Non-Acute Institutional Stay (SKILLED_NURSING_FACILITY): Payer: Medicare Other | Admitting: Internal Medicine

## 2013-02-23 DIAGNOSIS — I10 Essential (primary) hypertension: Secondary | ICD-10-CM

## 2013-02-23 DIAGNOSIS — J309 Allergic rhinitis, unspecified: Secondary | ICD-10-CM

## 2013-02-23 DIAGNOSIS — E7800 Pure hypercholesterolemia, unspecified: Secondary | ICD-10-CM

## 2013-02-23 DIAGNOSIS — K5900 Constipation, unspecified: Secondary | ICD-10-CM

## 2013-02-23 NOTE — Progress Notes (Signed)
Patient ID: Andre Marsh, male   DOB: 1927/02/17, 79 y.o.   MRN: [id number]        PROGRESS NOTE  DATE:  02/23/2013  FACILITY: [name]   LEVEL OF CARE: SNF  Routine Visit  CHIEF COMPLAINT:  Manage constipation, hyperlipidemia and hypertension.  HISTORY OF PRESENT ILLNESS:  REASSESSMENT OF ONGOING PROBLEM(S):  CONSTIPATION:  The constipation remains stable. No complications from the medications presently being used. Patient denies ongoing constipation, abdominal pain, nausea or vomiting.   HTN: Pt 's HTN remains stable.  Denies CP, sob, DOE, pedal edema, headaches, dizziness or visual disturbances.  No complications from the medications currently being used.  Last BP : 159/75, 125/69, 132/68, 141/66, 100/59, 115/66.  HYPERLIPIDEMIA: No complications from the medications presently being used. Last fasting lipid panel showed : On 09/30/2012:  Total cholesterol 202, LDL 132, triglycerides 141, HDL 42. In 6/14 total cholesterol 206, triglycerides 198, HDL 36, LDL 130. Patient does not have a history of coronary artery disease and cardiac work-up was negative in October 2013.   PAST MEDICAL HISTORY : Reviewed.  No changes.  CURRENT MEDICATIONS: Reviewed per [facility]  REVIEW OF SYSTEMS:  GENERAL: no change in appetite, no fatigue, no weight changes, no fever, chills or weakness RESPIRATORY: no cough, SOB, DOE, wheezing, hemoptysis CARDIAC: no chest pain, edema or palpitations GI: no constipation; no abdominal pain, diarrhea, heart burn, nausea or vomiting  PHYSICAL EXAMINATION  VS:  T 98.3     P 51    RR 18    BP 115/66     POX %     WT (Lb) 128.2  GENERAL: no acute distress, normal body habitus NECK: supple, trachea midline, no neck masses, no thyroid tenderness, no thyromegaly RESPIRATORY: breathing is even & unlabored, BS CTAB CARDIAC: RRR, no murmur,no extra heart sounds, no edema GI: abdomen soft, normal BS, no masses, no tenderness, no hepatomegaly, no splenomegaly PSYCHIATRIC:  the patient is alert & oriented to person, affect & behavior appropriate  LABS/RADIOLOGY:  6/14 hemoglobin A1c 5.7, CBC normal, glucose 139 otherwise BMP normal, fasting glucose 81 09/2012:   TSH 3.376.  07/2012:  Total protein 5.2, otherwise liver profile normal.    05/2012:  Vitamin B12 level 333, folate greater than 20.    Hemoglobin A1C 6.    04/2012:  Iron panel normal.   CBC normal.   BMP normal.   ASSESSMENT/PLAN:  Constipation.  Denies ongoing symptoms.  Hyperlipidemia.  LDL unchanged from previous and hypertension is the only coronary risk factor.  Therefore, no need to add a statin.    Hypertension.  Well controlled.  Allergic rhinitis.  No complaints.   Alzheimer's dementia.  Stable.   BPH.  No complaints.    Bipolar disorder.  Continue current medications.  Followed by Psychiatry.    GERD.  Well controlled.    CPT CODE: 78295

## 2013-03-01 ENCOUNTER — Other Ambulatory Visit: Payer: Self-pay | Admitting: *Deleted

## 2013-03-01 MED ORDER — TRAMADOL HCL 50 MG PO TABS: ORAL_TABLET | ORAL | Status: DC

## 2013-03-08 ENCOUNTER — Other Ambulatory Visit: Payer: Self-pay

## 2013-03-08 MED ORDER — TRAMADOL HCL 50 MG PO TABS: ORAL_TABLET | ORAL | Status: DC

## 2013-03-08 NOTE — Telephone Encounter (Signed)
Verified dose and instructions reflect manual request received by nursing home.   

## 2013-03-28 ENCOUNTER — Other Ambulatory Visit: Payer: Self-pay | Admitting: *Deleted

## 2013-03-28 MED ORDER — ZOLPIDEM TARTRATE 5 MG PO TABS: ORAL_TABLET | ORAL | Status: DC

## 2013-03-29 ENCOUNTER — Non-Acute Institutional Stay (SKILLED_NURSING_FACILITY): Payer: Medicare Other | Admitting: Internal Medicine

## 2013-03-29 DIAGNOSIS — E7800 Pure hypercholesterolemia, unspecified: Secondary | ICD-10-CM

## 2013-03-29 DIAGNOSIS — K5900 Constipation, unspecified: Secondary | ICD-10-CM

## 2013-03-29 DIAGNOSIS — I10 Essential (primary) hypertension: Secondary | ICD-10-CM

## 2013-03-29 DIAGNOSIS — J309 Allergic rhinitis, unspecified: Secondary | ICD-10-CM

## 2013-04-02 NOTE — Progress Notes (Signed)
Patient ID: Andre Marsh, male   DOB: [birth year], 79 y.o.   MRN: [id number]        PROGRESS NOTE  DATE:  03/29/2013  FACILITY: [name]   LEVEL OF CARE: SNF  Routine Visit  CHIEF COMPLAINT:  Manage constipation, hyperlipidemia and hypertension.  HISTORY OF PRESENT ILLNESS:  REASSESSMENT OF ONGOING PROBLEM(S):  CONSTIPATION:  The constipation remains stable. No complications from the medications presently being used. Patient denies ongoing constipation, abdominal pain, nausea or vomiting.   HTN: Pt 's HTN remains stable.  Denies CP, sob, DOE, pedal edema, headaches, dizziness or visual disturbances.  No complications from the medications currently being used.  Last BP : 159/75, 125/69, 132/68, 141/66, 100/59, 115/66, 124/60.  HYPERLIPIDEMIA: No complications from the medications presently being used. Last fasting lipid panel showed : On 09/30/2012:  Total cholesterol 202, LDL 132, triglycerides 141, HDL 42. In 6/14 total cholesterol 206, triglycerides 198, HDL 36, LDL 130. Patient does not have a history of coronary artery disease and cardiac work-up was negative in October 2013.   PAST MEDICAL HISTORY : Reviewed.  No changes.  CURRENT MEDICATIONS: Reviewed per [facility]  REVIEW OF SYSTEMS:  GENERAL: no change in appetite, no fatigue, no weight changes, no fever, chills or weakness RESPIRATORY: no cough, SOB, DOE, wheezing, hemoptysis CARDIAC: no chest pain, edema or palpitations GI: no constipation; no abdominal pain, diarrhea, heart burn, nausea or vomiting  PHYSICAL EXAMINATION  VS:  T 97.7     P 57    RR 21    BP 124/60     POX %     WT (Lb) 131.6  GENERAL: no acute distress, normal body habitus NECK: supple, trachea midline, no neck masses, no thyroid tenderness, no thyromegaly RESPIRATORY: breathing is even & unlabored, BS CTAB CARDIAC: RRR, no murmur,no extra heart sounds, no edema GI: abdomen soft, normal BS, no masses, no tenderness, no hepatomegaly, no  splenomegaly PSYCHIATRIC: the patient is alert & oriented to person, affect & behavior appropriate  LABS/RADIOLOGY:  6/14 hemoglobin A1c 5.7, CBC normal, glucose 139 otherwise BMP normal, fasting glucose 81 09/2012:   TSH 3.376.  07/2012:  Total protein 5.2, otherwise liver profile normal.    05/2012:  Vitamin B12 level 333, folate greater than 20.    Hemoglobin A1C 6.    04/2012:  Iron panel normal.   CBC normal.   BMP normal.   ASSESSMENT/PLAN:   Constipation.  Denies ongoing symptoms.  Hyperlipidemia.  LDL unchanged from previous and hypertension is the only coronary risk factor.  Therefore, no need to add a statin.    Hypertension.  Well controlled.  Allergic rhinitis.  No complaints.   Alzheimer's dementia.  Stable.   BPH.  No complaints.    Bipolar disorder.  Continue current medications.  Followed by Psychiatry.    GERD.  Well controlled.    Check liver profile.  CPT CODE: 40981

## 2013-07-04 ENCOUNTER — Non-Acute Institutional Stay (SKILLED_NURSING_FACILITY): Payer: Medicare Other | Admitting: Internal Medicine

## 2013-07-04 DIAGNOSIS — J309 Allergic rhinitis, unspecified: Secondary | ICD-10-CM

## 2013-07-04 DIAGNOSIS — E7800 Pure hypercholesterolemia, unspecified: Secondary | ICD-10-CM

## 2013-07-04 DIAGNOSIS — K5900 Constipation, unspecified: Secondary | ICD-10-CM

## 2013-07-04 DIAGNOSIS — I10 Essential (primary) hypertension: Secondary | ICD-10-CM

## 2013-07-08 NOTE — Progress Notes (Signed)
Patient ID: Andre CastillaHenry P Beaumont, male   DOB: 09/12/1926, 80 y.o.   MRN: [id number]         PROGRESS NOTE  DATE:  07/04/2013  [facility]   LEVEL OF CARE: SNF  Routine Visit  CHIEF COMPLAINT:  Manage constipation, hyperlipidemia and hypertension.  HISTORY OF PRESENT ILLNESS:  REASSESSMENT OF ONGOING PROBLEM(S):  CONSTIPATION:  The constipation remains stable. No complications from the medications presently being used. Patient denies ongoing constipation, abdominal pain, nausea or vomiting.   HTN: Pt 's HTN remains stable.  Denies CP, sob, DOE, pedal edema, headaches, dizziness or visual disturbances.  No complications from the medications currently being used.  Last BP : 159/75, 125/69, 132/68, 141/66, 100/59, 115/66, 124/60, 179/75.  HYPERLIPIDEMIA: No complications from the medications presently being used. Last fasting lipid panel showed : On 09/30/2012:  Total cholesterol 202, LDL 132, triglycerides 141, HDL 42. In 6/14 total cholesterol 206, triglycerides 198, HDL 36, LDL 130. Patient does not have a history of coronary artery disease and cardiac work-up was negative in October 2013.   PAST MEDICAL HISTORY : Reviewed.  No changes.  [facility]  REVIEW OF SYSTEMS:  GENERAL: no change in appetite, no fatigue, no weight changes, no fever, chills or weakness RESPIRATORY: no cough, SOB, DOE, wheezing, hemoptysis CARDIAC: no chest pain, edema or palpitations GI: no constipation; no abdominal pain, diarrhea, heart burn, nausea or vomiting  PHYSICAL EXAMINATION  VS:  T 97.5     P 51    RR 20    BP 179/75     POX %     WT (Lb) 124.6  GENERAL: no acute distress, normal body habitus NECK: supple, trachea midline, no neck masses, no thyroid tenderness, no thyromegaly RESPIRATORY: breathing is even & unlabored, BS CTAB CARDIAC: RRR, no murmur,no extra heart sounds, no edema GI: abdomen soft, normal BS, no masses, no tenderness, no hepatomegaly, no  splenomegaly PSYCHIATRIC: the patient is alert & oriented to person, affect & behavior appropriate  LABS/RADIOLOGY:  6/14 hemoglobin A1c 5.7, CBC normal, glucose 139 otherwise BMP normal, fasting glucose 81 09/2012:   TSH 3.376.  07/2012:  Total protein 5.2, otherwise liver profile normal.    05/2012:  Vitamin B12 level 333, folate greater than 20.    Hemoglobin A1C 6.    04/2012:  Iron panel normal.   CBC normal.   BMP normal.   ASSESSMENT/PLAN:   Constipation.  Denies ongoing symptoms.  Hyperlipidemia.  LDL unchanged from previous and hypertension is the only coronary risk factor.  Therefore, no need to add a statin.    Hypertension. BP elevated.  Will review a log.  Allergic rhinitis.  No complaints.   Alzheimer's dementia.  Stable.   BPH.  No complaints.    Bipolar disorder.  Continue current medications.  Followed by Psychiatry.    GERD.  Well controlled.    Check cbc & cmp  CPT CODE: [id number]

## 2013-08-26 ENCOUNTER — Encounter: Payer: Self-pay | Admitting: Internal Medicine

## 2013-08-27 NOTE — Progress Notes (Signed)
Patient ID: Andre CastillaHenry P Marsh, male   DOB: 01/30/1927, 80 y.o.   MRN: [id number]  [facility] Provider:  Gwenith Spitziffany L. Renato Gailseed, D.O., C.M.D.   Chief Complaint  Patient presents with  . Acute Visit    sleeping a lot, weak, frequent headaches, wonders if he has diabetes  . Medical Managment of Chronic Issues    sees outside psychiatrist    HPI:  80 yo male here for long term care with h/o chronic constipation, hyperlipidemia, htn, bipolar disorder, GERD, allergic rhinitis was seen due to above concerns.  Apparently, he has memory loss, as well b/c he is on galantamine and has bph on flomax.    Review of Systems:  ROS  Medications: Patient's Medications  New Prescriptions   No medications on file  Previous Medications   ASPIRIN 81 MG CHEWABLE TABLET    Chew 81 mg by mouth daily.   BUDESONIDE (ENTOCORT EC) 3 MG 24 HR CAPSULE    Take 9 mg by mouth every morning.   DOCUSATE SODIUM (COLACE) 100 MG CAPSULE    Take 100 mg by mouth daily.   FOLIC ACID (FOLVITE) 1 MG TABLET    Take 1 mg by mouth daily.   GALANTAMINE (RAZADYNE ER) 8 MG 24 HR CAPSULE    Take 8 mg by mouth daily with breakfast.   HALOPERIDOL (HALDOL) 1 MG TABLET    Take 3 mg by mouth at bedtime.   ISOSORBIDE MONONITRATE (IMDUR) 30 MG 24 HR TABLET    Take 30 mg by mouth daily.   LAMOTRIGINE (LAMICTAL) 100 MG TABLET    Take 100 mg by mouth 2 (two) times daily.   LISINOPRIL (PRINIVIL,ZESTRIL) 20 MG TABLET    Take 20 mg by mouth daily.   LORATADINE (CLARITIN) 10 MG TABLET    Take 10 mg by mouth daily.   METOPROLOL SUCCINATE (TOPROL-XL) 50 MG 24 HR TABLET    Take 50 mg by mouth daily. Take with or immediately following a meal.   OMEPRAZOLE (PRILOSEC) 20 MG CAPSULE    Take 20 mg by mouth 2 (two) times daily.   SENNA-DOCUSATE (SENOKOT-S) 8.6-50 MG PER TABLET    Take 1 tablet by mouth 2 (two) times daily.   TAMSULOSIN (FLOMAX) 0.4 MG CAPS CAPSULE    Take 0.4 mg by mouth.   TRAMADOL (ULTRAM) 50 MG TABLET    Take one tablet by mouth  every morning; Take one tablet by mouth every 8 hours as needed for pain   ZOLPIDEM (AMBIEN) 5 MG TABLET    Take 1/2 tablet at bedtime  Modified Medications   No medications on file  Discontinued Medications   No medications on file    Physical Exam: There were no vitals filed for this visit. Physical Exam  Labs reviewed:  Significant Diagnostic Results:   Assessment/Plan No problem-specific assessment & plan notes found for this encounter.  Goals of care: long term care resident      This encounter was created in error - please disregard.

## 2013-09-05 ENCOUNTER — Other Ambulatory Visit: Payer: Self-pay | Admitting: *Deleted

## 2013-09-05 MED ORDER — LORAZEPAM 0.5 MG PO TABS: ORAL_TABLET | ORAL | Status: DC

## 2013-09-05 NOTE — Telephone Encounter (Signed)
[facility] 

## 2013-09-28 ENCOUNTER — Non-Acute Institutional Stay (SKILLED_NURSING_FACILITY): Payer: Medicare Other | Admitting: Internal Medicine

## 2013-09-28 DIAGNOSIS — J309 Allergic rhinitis, unspecified: Secondary | ICD-10-CM

## 2013-09-28 DIAGNOSIS — F319 Bipolar disorder, unspecified: Secondary | ICD-10-CM

## 2013-09-28 DIAGNOSIS — R7309 Other abnormal glucose: Secondary | ICD-10-CM

## 2013-09-28 DIAGNOSIS — I10 Essential (primary) hypertension: Secondary | ICD-10-CM

## 2013-09-28 DIAGNOSIS — E7800 Pure hypercholesterolemia, unspecified: Secondary | ICD-10-CM

## 2013-09-28 DIAGNOSIS — R739 Hyperglycemia, unspecified: Secondary | ICD-10-CM

## 2013-09-28 DIAGNOSIS — J189 Pneumonia, unspecified organism: Secondary | ICD-10-CM

## 2013-09-28 NOTE — Progress Notes (Signed)
Patient ID: Andre Marsh, male   DOB: 04/13/1927, 80 y.o.   MRN: [id number]   This is an acute visit.  Level care skilled.  Facility  [facility]  Chief complaint-acute visit followup cough-pneumonia.  History of present illness.  Patient is a pleasant 80 year old male apparently has had a several day course of runny nose and what appears to be intermittent low-grade temperature as well as cough.  Apparently yesterday he has some chest congestion and an x-ray was ordered which came back showing a patchy infiltrate in the left lower lobe with atelectasis in the right lung base.  He has been started on duo nebs every 6 hours when necessary--as well as Mucinex 600 mg twice a day he is also been started on a Z-Pak-he has numerous drug allergies which limited antibiotic choices.  Currently he says his cough is better does not complaining of shortness of breath appears to be resting comfortably in his bed vital signs are stable.  Family medical social history has been reviewed per history and physical on 01/22/2010.  Medications have been reviewed per MAR.  Review of systems her  In general no complaints fever or chills.  Respiratory does not complain of shortness of breath says his cough is improved today. Her cardiac no chest pain or edema.  GI does not complaining of any nausea vomiting diarrhea or constipation or abdominal discomfort day.  GU history of BPH but does not complaining of any dysuria.  Muscle skeletal is not complaining of any joint pain or discomfort.  Neurologic no complaints of headache dizziness.  Psych does have a history of bipolar disorder he is seen by psychiatric services.  Physical exam. T- 96.8 pulse 94 respirations 20 blood pressure 100/62.  Blood pressure ranges100/62-147/82 although appears to however mainly systolically 110 -- 120  In general this is a somewhat frail elderly male in no distress resting comfortably in bed.  His skin is warm and  dry.  Eyes.  Visual acuity appears intact.  Oropharynx clear mucous membranes moist.  Chest is clear to auscultation no labored breathing do not really hear  congestion today  Heart is regular rate and rhythm without murmur gallop or rub. He does not have any significant lower extremity edema.  Abdomen soft nontender with positive bowel sounds.  Muscle skeletal general frailty but moves all extremities x4 and appears with baseline strength and range of motion.  Psych he is oriented to self pleasant and appropriate.   Labs. 09/16/2013.  WBC 7.7 hemoglobin 12.3 platelets 203.  Sodium 141 potassium 4.1 BUN 20 creatinine 1.00.  Fasting glucose 144.  Jan- 22nd 2015.  Liver function tests within normal limits.  Assessment and plan.  #1-pneumonia-this appears to be stable he is receiving duo nebs every 6 hours as needed for 3 days as well as Mucinex 600 mg twice a day for 5 days also has been started on a Z-Pak.--He also continues on Claritin routinely for history of allergic rhinitis as well as Budesonide  Continue to monitor with vital signs pulse ox.  #2 elevated fasting glucose-he is getting CBGs in the morning--they appeared stable largely  running in the low 100s I do see only one of 75 and only one of 178 all the rest range from 100-125  #3 past history of bipolar disorder this appears to be relatively stable I believe he is seen by psychiatric services--he is on Lamictal as well as haloperidol.  He's also on Galantamine for history of dementia.  #5-history  of BPH he continues on Flomax as well as Phenazopyridine apparently this has been stable   6 history of hypertension he is on beta blocker blood pressures appear to be stable  #7-apparently some history of hyperlipidemia we'll update a lipid panel and liver function tests  #8 past history of coronary artery disease apparently this has been stable he is on aspirin as well as a beta blocker and Imdur  [id number]        .      Marland Kitchen.      y

## 2013-10-06 ENCOUNTER — Non-Acute Institutional Stay (SKILLED_NURSING_FACILITY): Payer: Medicare Other | Admitting: Internal Medicine

## 2013-10-06 DIAGNOSIS — I10 Essential (primary) hypertension: Secondary | ICD-10-CM

## 2013-10-06 DIAGNOSIS — E7800 Pure hypercholesterolemia, unspecified: Secondary | ICD-10-CM

## 2013-10-06 DIAGNOSIS — K5900 Constipation, unspecified: Secondary | ICD-10-CM

## 2013-10-06 DIAGNOSIS — J309 Allergic rhinitis, unspecified: Secondary | ICD-10-CM

## 2013-10-07 ENCOUNTER — Other Ambulatory Visit: Payer: Self-pay | Admitting: *Deleted

## 2013-10-07 MED ORDER — ZOLPIDEM TARTRATE 5 MG PO TABS: ORAL_TABLET | ORAL | Status: DC

## 2013-10-07 NOTE — Telephone Encounter (Signed)
[facility] 

## 2013-10-07 NOTE — Progress Notes (Signed)
Patient ID: Andre Marsh, male   DOB: 01/02/1927, 80 y.o.   MRN: [id number]          PROGRESS NOTE  DATE:  10/06/2013  [facility]   LEVEL OF CARE: SNF  Routine Visit  CHIEF COMPLAINT:  Manage constipation, hyperlipidemia and hypertension.  HISTORY OF PRESENT ILLNESS:  REASSESSMENT OF ONGOING PROBLEM(S):  CONSTIPATION:  The constipation remains stable. No complications from the medications presently being used. Patient denies ongoing constipation, abdominal pain, nausea or vomiting.   HTN: Pt 's HTN remains stable.  Denies CP, sob, DOE, pedal edema, headaches, dizziness or visual disturbances.  No complications from the medications currently being used.  Last BP : 159/75, 125/69, 132/68, 141/66, 100/59, 115/66, 124/60, 179/75, 150/72.  HYPERLIPIDEMIA: No complications from the medications presently being used. Last fasting lipid panel showed : On 09/30/2012:  Total cholesterol 202, LDL 132, triglycerides 141, HDL 42. In 6/14 total cholesterol 206, triglycerides 198, HDL 36, LDL 130. Patient does not have a history of coronary artery disease and cardiac work-up was negative in October 2013, in 4-15 HDL 39, LDL 121, triglycerides 129, total cholesterol 182  PAST MEDICAL HISTORY : Reviewed.  No changes.  [facility]  REVIEW OF SYSTEMS:  GENERAL: no change in appetite, no fatigue, no weight changes, no fever, chills or weakness RESPIRATORY: no cough, SOB, DOE, wheezing, hemoptysis CARDIAC: no chest pain, edema or palpitations GI: no constipation; no abdominal pain, diarrhea, heart burn, nausea or vomiting  PHYSICAL EXAMINATION  VS:  See vital signs section  GENERAL: no acute distress, normal body habitus EYES: Normal sclerae, normal conjunctivae, no discharge NECK: supple, trachea midline, no neck masses, no thyroid tenderness, no thyromegaly LYMPHATICS: No cervical lymphadenopathy, no supraclavicular lymphadenopathy RESPIRATORY: breathing is  even & unlabored, BS CTAB CARDIAC: RRR, no murmur,no extra heart sounds, no edema GI: abdomen soft, normal BS, no masses, no tenderness, no hepatomegaly, no splenomegaly PSYCHIATRIC: the patient is alert & oriented to person, affect & behavior appropriate  LABS/RADIOLOGY: 4-15 hemoglobin 12.3, MCV 98.9 otherwise CBC normal, glucose 144 otherwise BMP normal, total protein 5.2, albumin 3.4 otherwise liver profile normal 6/14 hemoglobin A1c 5.7, CBC normal, glucose 139 otherwise BMP normal, fasting glucose 81 09/2012:   TSH 3.376.  07/2012:  Total protein 5.2, otherwise liver profile normal.    05/2012:  Vitamin B12 level 333, folate greater than 20.    Hemoglobin A1C 6.    04/2012:  Iron panel normal.   CBC normal.   BMP normal.   ASSESSMENT/PLAN:   Constipation.  Denies ongoing symptoms.  Hyperlipidemia.  LDL decreased and hypertension is the only coronary risk factor.  Therefore, no need to add a statin.    Hypertension. BP elevated.  Will review a log.  Allergic rhinitis.  No complaints.   Alzheimer's dementia.  Stable.   BPH.  No complaints.    Bipolar disorder.  Continue current medications.  Followed by Psychiatry.    GERD.  Well controlled.    CPT CODE: [id number]  Newton PiggGayani Y. Kerry Doryasanayaka, MD [facility] [phone]

## 2013-10-18 ENCOUNTER — Other Ambulatory Visit: Payer: Self-pay | Admitting: *Deleted

## 2013-10-18 MED ORDER — TRAMADOL HCL 50 MG PO TABS: ORAL_TABLET | ORAL | Status: DC

## 2013-10-18 NOTE — Telephone Encounter (Signed)
[facility] 

## 2013-10-19 ENCOUNTER — Non-Acute Institutional Stay (SKILLED_NURSING_FACILITY): Payer: Medicare Other | Admitting: Internal Medicine

## 2013-10-19 DIAGNOSIS — I714 Abdominal aortic aneurysm, without rupture, unspecified: Secondary | ICD-10-CM

## 2013-10-19 DIAGNOSIS — K30 Functional dyspepsia: Secondary | ICD-10-CM

## 2013-10-19 DIAGNOSIS — K3189 Other diseases of stomach and duodenum: Secondary | ICD-10-CM

## 2013-10-19 DIAGNOSIS — K219 Gastro-esophageal reflux disease without esophagitis: Secondary | ICD-10-CM

## 2013-10-19 DIAGNOSIS — R1013 Epigastric pain: Secondary | ICD-10-CM

## 2013-10-19 NOTE — Progress Notes (Signed)
Patient ID: Andre Marsh, male   DOB: 10/18/1926, 80 y.o.   MRN: [id number]   This is an acute visit.  Level care skilled.  [facility]   Chief complaint-acute visit secondary to nursing note that states patient thinks he may have a bleeding ulcer   History of present illness.  Patient is a pleasant 80 year old male Nursing is left a note about patient concerned about a bleeding ulcer.  However when I went to see patient today he stated that he thought it was just some indigestion from eating a hotdog apparently is a couple days ago apparently he had an episode of burning sensation in his stomach according to patient there's been no recurrence--there's been no rectal  Or GI  bleeding noted. Per chart review I do note he has a history apparently of an abdominal aortic aneurysm but I cannot really find any further information on this.  I did call his daughter who also was not really aware of this-she stated she would be okay with obtaining a further study abdominal ultrasound if covered by insurance   I have reviewed his medical records and do not really see a significant history of any GI bleeding -- I do see a history of Barrett's esophagus and he is on a PPI He does not complaining of any discomfort today this appears to be more of a one time episode.  .  Family medical social history has been reviewed per history and physical on 01/22/2010.   Medications have been reviewed per MAR.   Review of systemsr  In general no complaints fever or chills.  Respiratory does not complain of shortness of breath-- no chest pain or edema.  GI does not complaining of any nausea vomiting diarrhea or constipation or abdominal discomfort day.   .  Neurologic no complaints of headache dizziness.  Psych does have a history of bipolar disorder he is seen by psychiatric services.   Physical exam.   Denture 98.9 pulse 56 respirations 18 blood pressure 133/70 In general this is a somewhat frail  elderly male in no distress resting comfortably in bed.  His skin is warm and dry.  Eyes.  Visual acuity appears intact.  Oropharynx clear mucous membranes moist.  Chest is clear to auscultation no labored breathing do not really hear congestion today  Heart is regular rate and rhythm without murmur gallop or rub.  He does not have any significant lower extremity edema.  Abdomen soft nontender with positive bowel sounds it is not distended.     Psych he is oriented to self pleasant and appropriate .  Labs 09/29/2013.  Liver function tests within normal limits except albumin of 3.4.  .  09/16/2013.  WBC 7.7 hemoglobin 12.3 platelets 203.  Sodium 141 potassium 4.1 BUN 20 creatinine 1.00.  Fasting glucose 144.  Jan- 22nd 2015.  Liver function tests within normal limits.   Assessment and plan.  #1 ? bleeding ulcer-as stated above this appears to be more an episode of indigestion he does not complaining of any discomfort today he is on a PPI --is not complaining of any rectal bleeding nursing staff has not noted any-will update a CBC--he continues on a PPI for suspected history of GERD Barrett's esophagus  #2-history of abdominal aortic aneurysm?-As stated above will order an abdominal ultrasound --this was discussed with his daughter via phone    #2 past history of bipolar disorder this appears to be relatively stable he is seen by psychiatric services--he is  on Lamictal as well as haloperidol.  He's also on Galantamine for history of dementia.-Haldol dose was recently reduced by psychiatric services       #?? past history of coronary artery disease apparently this has been stable he is on aspirin as well as a beta blocker and Imdur   [phone]  Of note more than 30 minutes spent assessing patient-discussing his status with nursing staff--review of his medical records --f-also discussion with his daughter via phone about his medical history with abdominal aortic aneurysm-of  note greater than 50% of time spent coordinating plan of care

## 2014-01-16 ENCOUNTER — Non-Acute Institutional Stay (SKILLED_NURSING_FACILITY): Payer: Medicare Other | Admitting: Internal Medicine

## 2014-01-16 DIAGNOSIS — I714 Abdominal aortic aneurysm, without rupture, unspecified: Secondary | ICD-10-CM

## 2014-01-16 DIAGNOSIS — F319 Bipolar disorder, unspecified: Secondary | ICD-10-CM

## 2014-01-16 DIAGNOSIS — F03918 Unspecified dementia, unspecified severity, with other behavioral disturbance: Secondary | ICD-10-CM

## 2014-01-16 DIAGNOSIS — E7800 Pure hypercholesterolemia, unspecified: Secondary | ICD-10-CM

## 2014-01-16 DIAGNOSIS — I10 Essential (primary) hypertension: Secondary | ICD-10-CM

## 2014-01-16 DIAGNOSIS — J309 Allergic rhinitis, unspecified: Secondary | ICD-10-CM

## 2014-01-16 DIAGNOSIS — F0391 Unspecified dementia with behavioral disturbance: Secondary | ICD-10-CM

## 2014-01-16 NOTE — Progress Notes (Signed)
Patient ID: Andre Marsh, male   DOB: 09/03/1926, 80 y.o.   MRN: [id number]   Level care skilled.  [facility] This is a routine visit.    Chief complaint-medical management of chronic issues including hypertension-dementia-history bipolar disorder-hyperlipidemia-allergic rhinitis.    History of present illness.  Patient is a pleasant 80 year old male with the above diagnoses he appears to have been relatively stable.  There was a nursing note about patient complaining of low back pain however he denies that this evening.  I do note  at times his systolic blood pressure is elevated I got 170/80 today-it appears this is variable  readings ranging from 0104/63--113/64--141/71-177/83--he is on Lopressor 25 mg a day as well as lisinopril 10 mg a day.  Apparently behaviors have been stable he is on Namenda as well as Lamictal  And Hinda Glatternvega he is followed by psychiatric services it appears .  Family medical social history has been reviewed per history and physical on 01/22/2010 .  Medications have been reviewed per MAR.    Review of systems    In general no complaints fever or chills.  Respiratory does not complain of shortness of breath or cough.  cardiac no chest pain or edema.  GI does not complaining of any nausea vomiting diarrhea or constipation or abdominal discomfort day.  GU history of BPH but does not complaining of any dysuria.  Muscle skeletal is not complaining of any joint pain or discomfort.  Neurologic no complaints of headache dizziness.  Psych does have a history of bipolar disorder he is seen by psychiatric services.--Appears to be stable   Physical exam.  Temperature 97.9 pulse 58 respirations 20 blood pressure taken Manually 170/80 .  readings variable as noted above   In general this is a somewhat frail elderly male in no distress resting comfortably in bed.  His skin is warm and dry.  Eyes.  Visual acuity appears intact.  Oropharynx clear mucous  membranes moist.  Chest is clear to auscultation no labored breathing do not really hear congestion today  Heart is regular rate and rhythm without murmur gallop or rub--he is slightly bradycardic.  He does not have any significant lower extremity edema.  Abdomen soft nontender with positive bowel sounds.  Muscle skeletal general frailty but moves all extremities x4 and appears with baseline strength and range of motion.  Psych he is oriented to self pleasant and appropriate.    Labs  12/29/2013.  Cholesterol 187-HDL 37-LDL 125-triglycerides 126.  10/20/2013.  The WBC 6.7 hemoglobin 12.5 platelets 187  April 2015-liver function tests within normal limits except albumin of 3.4.  .  09/16/2013.  WBC 7.7 hemoglobin 12.3 platelets 203.  Sodium 141 potassium 4.1 BUN 20 creatinine 1.00.  Fasting glucose 144.  Jan- 22nd 2015.  Liver function tests within normal limits.    Assessment and plan .  #1-HTN_--variable systolics however appears recent systolics somewhat elevated will increase lisinopril to 20 mg a day and order blood pressure and pulse checks every shift with a log in provider book for review next week as clinically this appears stable he is on a beta blocker as well-also will update metabolic panel .  #2 elevated fasting glucose in past --Will check a hemoglobin A1c CBGs in the past have been largely unremarkable  #3 past history of bipolar disorder this appears to be relatively stable I believe he is seen by psychiatric services--he is on Lamictal  As well as Invega.  He's also on Galantamine for  history of dementia .  #4history of BPH -- continues on Flomax as well as Phenazopyridine apparently this has been stable      #5  Hyperlipidemia--at  this point will monitor-cholesterol panel  relatively baseline with previous results---will obtain  liver function tests--considering patient's advanced age hesitant to be real aggressive care   #6 apparent history of  coronary artery disease apparently this has been stable he is on aspirin as well as a beta blocker and Imdur  #7--HxAAA-recent ultrasound showed 3.4x3.2 cm-at this point we'll monitor asymptomatic  #8-allergic rhinitis-this is been stable he is on Claritin  #9 GERD he continues on a PPI this apparently is stable as well   [id number]  .

## 2014-01-24 DIAGNOSIS — F0391 Unspecified dementia with behavioral disturbance: Secondary | ICD-10-CM | POA: Insufficient documentation

## 2014-01-24 DIAGNOSIS — F03918 Unspecified dementia, unspecified severity, with other behavioral disturbance: Secondary | ICD-10-CM | POA: Insufficient documentation

## 2014-02-01 ENCOUNTER — Encounter (HOSPITAL_COMMUNITY): Payer: Self-pay | Admitting: Emergency Medicine

## 2014-02-01 ENCOUNTER — Emergency Department (HOSPITAL_COMMUNITY): Payer: Medicare Other

## 2014-02-01 ENCOUNTER — Observation Stay (HOSPITAL_COMMUNITY)
Admission: EM | Admit: 2014-02-01 | Discharge: 2014-02-03 | Disposition: A | Discharge status: Home / Self Care | Payer: Medicare Other | Attending: Internal Medicine | Admitting: Internal Medicine

## 2014-02-01 DIAGNOSIS — F411 Generalized anxiety disorder: Secondary | ICD-10-CM | POA: Diagnosis not present

## 2014-02-01 DIAGNOSIS — I10 Essential (primary) hypertension: Secondary | ICD-10-CM | POA: Insufficient documentation

## 2014-02-01 DIAGNOSIS — F329 Major depressive disorder, single episode, unspecified: Secondary | ICD-10-CM | POA: Diagnosis not present

## 2014-02-01 DIAGNOSIS — F3289 Other specified depressive episodes: Secondary | ICD-10-CM | POA: Insufficient documentation

## 2014-02-01 DIAGNOSIS — F0280 Dementia in other diseases classified elsewhere without behavioral disturbance: Secondary | ICD-10-CM | POA: Insufficient documentation

## 2014-02-01 DIAGNOSIS — R61 Generalized hyperhidrosis: Secondary | ICD-10-CM | POA: Diagnosis not present

## 2014-02-01 DIAGNOSIS — I714 Abdominal aortic aneurysm, without rupture, unspecified: Secondary | ICD-10-CM | POA: Diagnosis present

## 2014-02-01 DIAGNOSIS — G20A1 Parkinson's disease without dyskinesia, without mention of fluctuations: Secondary | ICD-10-CM | POA: Insufficient documentation

## 2014-02-01 DIAGNOSIS — R0602 Shortness of breath: Secondary | ICD-10-CM | POA: Insufficient documentation

## 2014-02-01 DIAGNOSIS — Z79899 Other long term (current) drug therapy: Secondary | ICD-10-CM | POA: Insufficient documentation

## 2014-02-01 DIAGNOSIS — K219 Gastro-esophageal reflux disease without esophagitis: Secondary | ICD-10-CM | POA: Insufficient documentation

## 2014-02-01 DIAGNOSIS — R110 Nausea: Secondary | ICD-10-CM | POA: Diagnosis not present

## 2014-02-01 DIAGNOSIS — G20 Parkinson's disease: Secondary | ICD-10-CM | POA: Diagnosis not present

## 2014-02-01 DIAGNOSIS — E785 Hyperlipidemia, unspecified: Secondary | ICD-10-CM | POA: Diagnosis not present

## 2014-02-01 DIAGNOSIS — R079 Chest pain, unspecified: Secondary | ICD-10-CM | POA: Insufficient documentation

## 2014-02-01 DIAGNOSIS — G309 Alzheimer's disease, unspecified: Secondary | ICD-10-CM | POA: Insufficient documentation

## 2014-02-01 DIAGNOSIS — F0391 Unspecified dementia with behavioral disturbance: Secondary | ICD-10-CM | POA: Diagnosis present

## 2014-02-01 DIAGNOSIS — R001 Bradycardia, unspecified: Secondary | ICD-10-CM | POA: Diagnosis present

## 2014-02-01 DIAGNOSIS — Z23 Encounter for immunization: Secondary | ICD-10-CM | POA: Insufficient documentation

## 2014-02-01 DIAGNOSIS — R55 Syncope and collapse: Principal | ICD-10-CM | POA: Insufficient documentation

## 2014-02-01 DIAGNOSIS — Z8669 Personal history of other diseases of the nervous system and sense organs: Secondary | ICD-10-CM | POA: Diagnosis not present

## 2014-02-01 DIAGNOSIS — I498 Other specified cardiac arrhythmias: Secondary | ICD-10-CM

## 2014-02-01 DIAGNOSIS — F03918 Unspecified dementia, unspecified severity, with other behavioral disturbance: Secondary | ICD-10-CM | POA: Diagnosis present

## 2014-02-01 DIAGNOSIS — E7800 Pure hypercholesterolemia, unspecified: Secondary | ICD-10-CM | POA: Diagnosis present

## 2014-02-01 DIAGNOSIS — Z880 Allergy status to penicillin: Secondary | ICD-10-CM | POA: Insufficient documentation

## 2014-02-01 DIAGNOSIS — F319 Bipolar disorder, unspecified: Secondary | ICD-10-CM | POA: Diagnosis not present

## 2014-02-01 DIAGNOSIS — I959 Hypotension, unspecified: Secondary | ICD-10-CM | POA: Diagnosis present

## 2014-02-01 HISTORY — DX: Atherosclerotic heart disease of native coronary artery without angina pectoris: I25.10

## 2014-02-01 HISTORY — DX: Essential (primary) hypertension: I10

## 2014-02-01 HISTORY — DX: Gastro-esophageal reflux disease without esophagitis: K21.9

## 2014-02-01 HISTORY — DX: Major depressive disorder, single episode, unspecified: F32.9

## 2014-02-01 HISTORY — DX: Anxiety disorder, unspecified: F41.9

## 2014-02-01 HISTORY — DX: Bipolar disorder, unspecified: F31.9

## 2014-02-01 HISTORY — DX: Unspecified cataract: H26.9

## 2014-02-01 HISTORY — DX: Depression, unspecified: F32.A

## 2014-02-01 HISTORY — DX: Abdominal aortic aneurysm, without rupture, unspecified: I71.40

## 2014-02-01 HISTORY — DX: Hyperlipidemia, unspecified: E78.5

## 2014-02-01 HISTORY — DX: Abdominal aortic aneurysm, without rupture: I71.4

## 2014-02-01 HISTORY — DX: Unspecified fall, initial encounter: W19.XXXA

## 2014-02-01 HISTORY — DX: Parkinson's disease without dyskinesia, without mention of fluctuations: G20.A1

## 2014-02-01 HISTORY — DX: Parkinson's disease: G20

## 2014-02-01 HISTORY — DX: Dementia in other diseases classified elsewhere, unspecified severity, without behavioral disturbance, psychotic disturbance, mood disturbance, and anxiety: F02.80

## 2014-02-01 HISTORY — DX: Alzheimer's disease, unspecified: G30.9

## 2014-02-01 LAB — CBC
HEMATOCRIT: 39.2 % (ref 39.0–52.0)
HEMOGLOBIN: 13.1 g/dL (ref 13.0–17.0)
MCH: 33.4 pg (ref 26.0–34.0)
MCHC: 33.4 g/dL (ref 30.0–36.0)
MCV: 100 fL (ref 78.0–100.0)
Platelets: 186 10*3/uL (ref 150–400)
RBC: 3.92 MIL/uL — AB (ref 4.22–5.81)
RDW: 13.1 % (ref 11.5–15.5)
WBC: 8.9 10*3/uL (ref 4.0–10.5)

## 2014-02-01 LAB — I-STAT CHEM 8, ED
BUN: 22 mg/dL (ref 6–23)
CHLORIDE: 106 meq/L (ref 96–112)
CREATININE: 1.2 mg/dL (ref 0.50–1.35)
Calcium, Ion: 1.21 mmol/L (ref 1.13–1.30)
GLUCOSE: 124 mg/dL — AB (ref 70–99)
HCT: 39 % (ref 39.0–52.0)
HEMOGLOBIN: 13.3 g/dL (ref 13.0–17.0)
Potassium: 4.1 mEq/L (ref 3.7–5.3)
SODIUM: 137 meq/L (ref 137–147)
TCO2: 24 mmol/L (ref 0–100)

## 2014-02-01 LAB — I-STAT TROPONIN, ED: TROPONIN I, POC: 0.01 ng/mL (ref 0.00–0.08)

## 2014-02-01 LAB — TSH: TSH: 2.1 u[IU]/mL (ref 0.350–4.500)

## 2014-02-01 LAB — TROPONIN I: Troponin I: <0.3 ng/mL (ref <0.30)

## 2014-02-01 LAB — D-DIMER, QUANTITATIVE (NOT AT ARMC): D-Dimer, Quant: 1.19 ug/mL-FEU — ABNORMAL HIGH (ref 0.00–0.48)

## 2014-02-01 LAB — PRO B NATRIURETIC PEPTIDE: Pro B Natriuretic peptide (BNP): 293.7 pg/mL (ref 0–450)

## 2014-02-01 MED ORDER — BUDESONIDE 3 MG PO CP24
9.0000 mg | ORAL_CAPSULE | ORAL | Status: DC
  Administered 2014-02-02 – 2014-02-03 (×2): 9 mg via ORAL
  Filled 2014-02-01 (×3): qty 3

## 2014-02-01 MED ORDER — ACETAMINOPHEN 325 MG PO TABS
650.0000 mg | ORAL_TABLET | Freq: Four times a day (QID) | ORAL | Status: DC | PRN
  Administered 2014-02-02: 650 mg via ORAL
  Filled 2014-02-01: qty 2

## 2014-02-01 MED ORDER — LORAZEPAM 0.5 MG PO TABS: 0.5000 mg | ORAL_TABLET | Freq: Two times a day (BID) | ORAL | Status: DC | PRN

## 2014-02-01 MED ORDER — ISOSORBIDE MONONITRATE ER 30 MG PO TB24
30.0000 mg | ORAL_TABLET | Freq: Every day | ORAL | Status: DC
  Administered 2014-02-02 – 2014-02-03 (×2): 30 mg via ORAL
  Filled 2014-02-01 (×2): qty 1

## 2014-02-01 MED ORDER — TRAMADOL HCL 50 MG PO TABS: 50.0000 mg | ORAL_TABLET | ORAL | Status: DC

## 2014-02-01 MED ORDER — LORATADINE 10 MG PO TABS
10.0000 mg | ORAL_TABLET | Freq: Every day | ORAL | Status: DC
  Administered 2014-02-02 – 2014-02-03 (×2): 10 mg via ORAL
  Filled 2014-02-01 (×3): qty 1

## 2014-02-01 MED ORDER — SODIUM CHLORIDE 0.9 % IJ SOLN
3.0000 mL | Freq: Two times a day (BID) | INTRAMUSCULAR | Status: DC
  Administered 2014-02-02 – 2014-02-03 (×3): 3 mL via INTRAVENOUS

## 2014-02-01 MED ORDER — ONDANSETRON HCL 4 MG/2ML IJ SOLN: 4.0000 mg | Freq: Four times a day (QID) | INTRAMUSCULAR | Status: DC | PRN

## 2014-02-01 MED ORDER — ASPIRIN EC 325 MG PO TBEC
325.0000 mg | DELAYED_RELEASE_TABLET | Freq: Every day | ORAL | Status: DC
  Administered 2014-02-02: 325 mg via ORAL
  Filled 2014-02-01: qty 1

## 2014-02-01 MED ORDER — NITROGLYCERIN 0.6 MG SL SUBL
0.6000 mg | SUBLINGUAL_TABLET | SUBLINGUAL | Status: DC | PRN
  Filled 2014-02-01: qty 100

## 2014-02-01 MED ORDER — TOBRAMYCIN 0.3 % OP SOLN
1.0000 [drp] | Freq: Four times a day (QID) | OPHTHALMIC | Status: DC
  Administered 2014-02-02 – 2014-02-03 (×4): 1 [drp] via OPHTHALMIC
  Filled 2014-02-01 (×2): qty 5

## 2014-02-01 MED ORDER — ONDANSETRON HCL 4 MG PO TABS: 4.0000 mg | ORAL_TABLET | Freq: Four times a day (QID) | ORAL | Status: DC | PRN

## 2014-02-01 MED ORDER — NEPAFENAC 0.1 % OP SUSP
1.0000 [drp] | Freq: Three times a day (TID) | OPHTHALMIC | Status: DC
  Administered 2014-02-02 – 2014-02-03 (×4): 1 [drp] via OPHTHALMIC
  Filled 2014-02-01 (×2): qty 3

## 2014-02-01 MED ORDER — NEPAFENAC 0.3 % OP SUSP
1.0000 [drp] | Freq: Every day | OPHTHALMIC | Status: DC
  Filled 2014-02-01: qty 1

## 2014-02-01 MED ORDER — PREDNISOLONE ACETATE 1 % OP SUSP
1.0000 [drp] | Freq: Four times a day (QID) | OPHTHALMIC | Status: DC
  Administered 2014-02-02 – 2014-02-03 (×4): 1 [drp] via OPHTHALMIC
  Filled 2014-02-01 (×2): qty 1

## 2014-02-01 MED ORDER — PANTOPRAZOLE SODIUM 40 MG PO TBEC
40.0000 mg | DELAYED_RELEASE_TABLET | Freq: Every day | ORAL | Status: DC
  Administered 2014-02-02 – 2014-02-03 (×2): 40 mg via ORAL
  Filled 2014-02-01 (×2): qty 1

## 2014-02-01 MED ORDER — TRAMADOL HCL 50 MG PO TABS
50.0000 mg | ORAL_TABLET | Freq: Every morning | ORAL | Status: DC
  Administered 2014-02-02 – 2014-02-03 (×2): 50 mg via ORAL
  Filled 2014-02-01 (×2): qty 1

## 2014-02-01 MED ORDER — SODIUM CHLORIDE 0.9 % IV SOLN
INTRAVENOUS | Status: AC
  Administered 2014-02-01: 19:00:00 via INTRAVENOUS

## 2014-02-01 MED ORDER — ONDANSETRON HCL 4 MG/2ML IJ SOLN
4.0000 mg | Freq: Once | INTRAMUSCULAR | Status: AC
  Administered 2014-02-01: 4 mg via INTRAVENOUS
  Filled 2014-02-01: qty 2

## 2014-02-01 MED ORDER — PALIPERIDONE ER 3 MG PO TB24
3.0000 mg | ORAL_TABLET | Freq: Every day | ORAL | Status: DC
  Administered 2014-02-02: 3 mg via ORAL
  Filled 2014-02-01 (×4): qty 1

## 2014-02-01 MED ORDER — NITROGLYCERIN 0.4 MG SL SUBL: 0.4000 mg | SUBLINGUAL_TABLET | SUBLINGUAL | Status: DC | PRN

## 2014-02-01 MED ORDER — FOLIC ACID 1 MG PO TABS
1.0000 mg | ORAL_TABLET | Freq: Every day | ORAL | Status: DC
  Administered 2014-02-02 – 2014-02-03 (×2): 1 mg via ORAL
  Filled 2014-02-01 (×3): qty 1

## 2014-02-01 MED ORDER — TRAMADOL HCL 50 MG PO TABS: 50.0000 mg | ORAL_TABLET | Freq: Three times a day (TID) | ORAL | Status: DC | PRN

## 2014-02-01 MED ORDER — ENSURE PO LIQD
250.0000 mL | Freq: Every day | ORAL | Status: DC
  Administered 2014-02-02: 250 mL via ORAL
  Filled 2014-02-01 (×3): qty 250

## 2014-02-01 MED ORDER — ASPIRIN 325 MG PO TABS
325.0000 mg | ORAL_TABLET | Freq: Every day | ORAL | Status: DC
  Administered 2014-02-01: 325 mg via ORAL
  Filled 2014-02-01: qty 1

## 2014-02-01 MED ORDER — DOCUSATE SODIUM 100 MG PO CAPS: 100.0000 mg | ORAL_CAPSULE | Freq: Every day | ORAL | Status: DC

## 2014-02-01 MED ORDER — DIFLUPREDNATE 0.05 % OP EMUL: 1.0000 [drp] | Freq: Two times a day (BID) | OPHTHALMIC | Status: DC

## 2014-02-01 MED ORDER — TAMSULOSIN HCL 0.4 MG PO CAPS
0.4000 mg | ORAL_CAPSULE | Freq: Every day | ORAL | Status: DC
  Administered 2014-02-02: 0.4 mg via ORAL
  Filled 2014-02-01 (×3): qty 1

## 2014-02-01 MED ORDER — DIFLUPREDNATE 0.05 % OP EMUL
1.0000 [drp] | Freq: Two times a day (BID) | OPHTHALMIC | Status: DC
  Filled 2014-02-01: qty 5

## 2014-02-01 MED ORDER — ZOLPIDEM TARTRATE 5 MG PO TABS: 2.5000 mg | ORAL_TABLET | Freq: Every evening | ORAL | Status: DC | PRN

## 2014-02-01 MED ORDER — LAMOTRIGINE 100 MG PO TABS
100.0000 mg | ORAL_TABLET | Freq: Two times a day (BID) | ORAL | Status: DC
  Administered 2014-02-01 – 2014-02-03 (×4): 100 mg via ORAL
  Filled 2014-02-01 (×6): qty 1

## 2014-02-01 MED ORDER — SENNOSIDES-DOCUSATE SODIUM 8.6-50 MG PO TABS
1.0000 | ORAL_TABLET | Freq: Every day | ORAL | Status: DC
  Administered 2014-02-02 – 2014-02-03 (×2): 1 via ORAL
  Filled 2014-02-01 (×3): qty 1

## 2014-02-01 MED ORDER — ACETAMINOPHEN 650 MG RE SUPP: 650.0000 mg | Freq: Four times a day (QID) | RECTAL | Status: DC | PRN

## 2014-02-01 MED ORDER — GALANTAMINE HYDROBROMIDE ER 8 MG PO CP24
8.0000 mg | ORAL_CAPSULE | Freq: Every day | ORAL | Status: DC
  Administered 2014-02-02 – 2014-02-03 (×2): 8 mg via ORAL
  Filled 2014-02-01 (×3): qty 1

## 2014-02-01 MED ORDER — ENOXAPARIN SODIUM 40 MG/0.4ML ~~LOC~~ SOLN
40.0000 mg | SUBCUTANEOUS | Status: DC
  Administered 2014-02-01 – 2014-02-02 (×2): 40 mg via SUBCUTANEOUS
  Filled 2014-02-01 (×3): qty 0.4

## 2014-02-01 NOTE — ED Notes (Signed)
Attempted report 

## 2014-02-01 NOTE — ED Provider Notes (Signed)
CSN: [id number]     Arrival date & time 02/01/14  1147 History   First MD Initiated Contact with Patient 02/01/14 1223     Chief Complaint  Patient presents with  . Near Syncope     (Consider location/radiation/quality/duration/timing/severity/associated sxs/prior Treatment) HPI Comments: Patient is a 80 year old male with history of hypertension, hyperlipidemia, bipolar 1 disorder, Alzheimer disease who presents to the emergency department today after having a near syncopal episode. He reports he was at his eye doctor when he began to feel lightheaded. He was pale and diaphoretic with a heart rate in the 40s. He had associated chest pain which he describes as mild. He had associated nausea. He denies history of heart disease. He has never seen a cardiologist, but states she has an appointment for next week for evaluation of worsening dyspnea on exertion. He currently feels improved and denies any chest pain.  The history is provided by the patient. No language interpreter was used.    Past Medical History  Diagnosis Date  . Bipolar 1 disorder   . Hypertension   . Anxiety   . Paralysis agitans   . Alzheimer disease   . Depression   . Fall   . GERD (gastroesophageal reflux disease)   . Cataract     bilateral   Past Surgical History  Procedure Laterality Date  . Cataract extraction Left    No family history on file. History  Substance Use Topics  . Smoking status: Never Smoker   . Smokeless tobacco: Not on file  . Alcohol Use: Yes     Comment: wine occasionally    Review of Systems  Constitutional: Positive for diaphoresis. Negative for fever and chills.  Respiratory: Positive for shortness of breath.   Cardiovascular: Positive for chest pain.  Gastrointestinal: Positive for nausea. Negative for vomiting and abdominal pain.  All other systems reviewed and are negative.     Allergies  Ciprofloxacin hcl; Cocoa; Codeine; Hydrocodone; Librax; Penicillins; Sulfa  antibiotics; and Tetracyclines & related  Home Medications   Prior to Admission medications   Medication Sig Start Date End Date Taking? Authorizing Provider  DUREZOL 0.05 % EMUL Place 1 drop into the left eye 2 (two) times daily. For 1 week, starting 8/19. 01/23/14  Yes Historical Provider, MD  ILEVRO 0.3 % SUSP Place 1 drop into the left eye at bedtime. For 4 weeks, starting 01/24/2014 01/07/14  Yes Historical Provider, MD  INVEGA 3 MG 24 hr tablet Take 3 mg by mouth at bedtime. For paranoia and mood 01/12/14  Yes Historical Provider, MD  tobramycin (TOBREX) 0.3 % ophthalmic solution Place 1 drop into the left eye 4 (four) times daily. For 2 weeks following surgery. Starting 01/24/2014 01/23/14  Yes Historical Provider, MD  zolpidem (AMBIEN) 5 MG tablet Take 2.5 mg by mouth at bedtime as needed for sleep.   Yes Historical Provider, MD  baclofen (LIORESAL) 10 MG tablet Take 10 mg by mouth 4 (four) times daily as needed for muscle spasms.    Historical Provider, MD  budesonide (ENTOCORT EC) 3 MG 24 hr capsule Take 9 mg by mouth every morning.    Historical Provider, MD  docusate sodium (COLACE) 100 MG capsule Take 100 mg by mouth daily.    Historical Provider, MD  folic acid (FOLVITE) 1 MG tablet Take 1 mg by mouth daily.    Historical Provider, MD  galantamine (RAZADYNE ER) 8 MG 24 hr capsule Take 8 mg by mouth daily with breakfast.  Historical Provider, MD  haloperidol (HALDOL) 1 MG tablet Take 2 mg by mouth at bedtime.     Historical Provider, MD  isosorbide mononitrate (IMDUR) 30 MG 24 hr tablet Take 30 mg by mouth daily.    Historical Provider, MD  lamoTRIgine (LAMICTAL) 100 MG tablet Take 100 mg by mouth 2 (two) times daily.    Historical Provider, MD  lisinopril (PRINIVIL,ZESTRIL) 20 MG tablet Take 10 mg by mouth daily.     Historical Provider, MD  loratadine (CLARITIN) 10 MG tablet Take 10 mg by mouth daily.    Historical Provider, MD  omeprazole (PRILOSEC) 20 MG capsule Take 20 mg by mouth  2 (two) times daily.    Historical Provider, MD  phenazopyridine (PYRIDIUM) 100 MG tablet Take 100 mg by mouth 3 (three) times daily with meals.    Historical Provider, MD  senna-docusate (SENOKOT-S) 8.6-50 MG per tablet Take 1 tablet by mouth 2 (two) times daily.    Historical Provider, MD  tamsulosin (FLOMAX) 0.4 MG CAPS capsule Take 0.4 mg by mouth.    Historical Provider, MD   BP 129/53  Pulse 64  Temp(Src) 97.9 F (36.6 C) (Oral)  Resp 17  Ht 5\' 4"  (1.626 m)  Wt 130 lb (58.968 kg)  BMI 22.30 kg/m2  SpO2 96% Physical Exam  Nursing note and vitals reviewed. Constitutional: He is oriented to person, place, and time. He appears well-developed and well-nourished. No distress.  NAD  HENT:  Head: Normocephalic and atraumatic.  Right Ear: External ear normal.  Left Ear: External ear normal.  Nose: Nose normal.  Eyes: Conjunctivae are normal.  Neck: Normal range of motion. No tracheal deviation present.  Cardiovascular: Normal rate, regular rhythm, normal heart sounds, intact distal pulses and normal pulses.   Pulses:      Radial pulses are 2+ on the right side, and 2+ on the left side.       Posterior tibial pulses are 2+ on the right side, and 2+ on the left side.  Pulmonary/Chest: Effort normal and breath sounds normal. No stridor.  Abdominal: Soft. He exhibits no distension. There is no tenderness.  Musculoskeletal: Normal range of motion.  Neurological: He is alert and oriented to person, place, and time.  Skin: Skin is warm and dry. He is not diaphoretic.  Psychiatric: He has a normal mood and affect. His behavior is normal.    ED Course  Procedures (including critical care time) Labs Review Labs Reviewed  CBC - Abnormal; Notable for the following:    RBC 3.92 (*)    All other components within normal limits  I-STAT CHEM 8, ED - Abnormal; Notable for the following:    Glucose, Bld 124 (*)    All other components within normal limits  PRO B NATRIURETIC PEPTIDE  I-STAT  TROPOININ, ED    Imaging Review Dg Chest 2 View  02/01/2014   CLINICAL DATA:  Near syncope  EXAM: CHEST  2 VIEW  COMPARISON:  10/19/2012  FINDINGS: Heart size is upper normal. Negative for heart failure. Left lower lobe airspace density similar to the prior study and most consistent with scarring. Negative for pleural effusion.  Severe compression fracture approximately T12 is unchanged.  IMPRESSION: Left lower lobe airspace disease is most consistent with scarring.   Electronically Signed   By: Marlan Palauharles  Clark M.D.   On: 02/01/2014 13:06     EKG Interpretation   Date/Time:  Wednesday February 01 2014 12:08:19 EDT Ventricular Rate:  57 PR Interval:  124 QRS Duration: 132 QT Interval:  462 QTC Calculation: 450 R Axis:   30 Text Interpretation:  Sinus rhythm Right bundle branch block No  significant change since last tracing Confirmed by Rubin Payor  MD, Harrold Donath  [phone]) on 02/01/2014 12:47:33 PM      MDM   Final diagnoses:  Near syncope   Concern for cardiac etiology of Chest Pain and near syncope. Hospitalist has been consulted for admission. Pt does not meet criteria for CP protocol and a further evaluation is recommended. Pt has been re-evaluated prior to consult and VSS, NAD, heart RRR, pain 0/10, lungs CTAB. No acute abnormalities found on EKG and first round of cardiac enzymes negative. This case was discussed with Dr. Rubin Payor who agrees with plan to admit.     Mora Bellman, PA-C 02/01/14 [phone]

## 2014-02-01 NOTE — H&P (Signed)
[facility] History and Physical  Andre Marsh:[id number] DOB: Aug 02, 1926 DOA: 02/01/2014  Referring physician: EDP PCP: Karlene Einstein, MD   Chief Complaint:   HPI: Andre Marsh is a 80 y.o. male with history of hypertension, anxiety/depression, mild dementia, paralysis agitans cataracts, who was in his ophthalmologist today and had a presyncopal episode. He states that he just felt very weak, nauseous and per staff he had some lightheadedness and was pale and diaphoretic. Per EMS his heart rate was in the 40s on arrival. Patient denies associated chest pain today-stating that he had some chest pain on Sunday that lasted about 6 hours(though per staff report he had had some chest pain prior to ED). He denies vomiting, diarrhea,  Melena, hematochezia, cough, fevers and no dysuria. It is noted that he is on metoprolol and his heart rate in the ED was in the upper 50s and initial blood pressure 95/70. In the ED chest x-ray showed findings consistent with scarring, troponin negative and hemoglobin of 13.1. He is admitted for further evaluation and management Patient admits to dyspnea on exertion for the past 6 months, and states that he is followed by cardiologist-Dr. Lisbeth Marsh in [facility].    Review of Systems The patient denies anorexia, hoarseness, chest pain, syncope, dyspnea on exertion, peripheral edema, balance deficits, hemoptysis, abdominal pain, melena, hematochezia, severe indigestion/heartburn, hematuria, incontinence, genital sores, muscle weakness, suspicious skin lesions, transient blindness, depression, unusual weight change, abnormal bleeding.    Past Medical History  Diagnosis Date  . Bipolar 1 disorder   . Hypertension   . Anxiety   . Paralysis agitans   . Alzheimer disease   . Depression   . Fall   . GERD (gastroesophageal reflux disease)   . Cataract     bilateral   Past Surgical History  Procedure Laterality Date  . Cataract extraction Left    Social  History:  reports that he has never smoked. He does not have any smokeless tobacco history on file. He reports that he drinks alcohol. His drug history is not on file.  Allergies  Allergen Reactions  . Ciprofloxacin Hcl Nausea And Vomiting  . Cocoa Other (See Comments)    Unknown; listed in MAR  . Codeine Other (See Comments)    Unknown; listed in MAR  . Hydrocodone Other (See Comments)    Unknown; listed in MAR  . Librax [Chlordiazepoxide-Clidinium] Other (See Comments)    Unknown; listed in MAR  . Loperamide Hcl Other (See Comments)    Unknown; listed in MAR  . Metaxalone Other (See Comments)    Unknown; listed in MAR  . Penicillins Other (See Comments)    Unknown; listed in MAR  . Sulfa Antibiotics Nausea And Vomiting and Rash  . Tetracyclines & Related Rash    No family history on file.   Prior to Admission medications   Medication Sig Start Date End Date Taking? Authorizing Provider  acetaminophen (TYLENOL) 325 MG tablet Take 650 mg by mouth every 4 (four) hours as needed for mild pain.   Yes Historical Provider, MD  aspirin 325 MG EC tablet Take 325 mg by mouth daily.   Yes Historical Provider, MD  budesonide (ENTOCORT EC) 3 MG 24 hr capsule Take 9 mg by mouth every morning.   Yes Historical Provider, MD  docusate sodium (COLACE) 100 MG capsule Take 100 mg by mouth daily.   Yes Historical Provider, MD  DUREZOL 0.05 % EMUL Place 1 drop into the left eye 2 (two) times  daily. For 1 week, starting 8/19. 01/23/14  Yes Historical Provider, MD  ENSURE (ENSURE) Take 237 mLs by mouth at bedtime. With snack   Yes Historical Provider, MD  folic acid (FOLVITE) 1 MG tablet Take 1 mg by mouth daily.   Yes Historical Provider, MD  galantamine (RAZADYNE ER) 8 MG 24 hr capsule Take 8 mg by mouth daily with breakfast.   Yes Historical Provider, MD  ILEVRO 0.3 % SUSP Place 1 drop into the left eye at bedtime. For 4 weeks, starting 01/24/2014 01/07/14  Yes Historical Provider, MD  INVEGA 3 MG 24  hr tablet Take 3 mg by mouth at bedtime. For paranoia and mood 01/12/14  Yes Historical Provider, MD  isosorbide mononitrate (IMDUR) 30 MG 24 hr tablet Take 30 mg by mouth daily.   Yes Historical Provider, MD  lamoTRIgine (LAMICTAL) 100 MG tablet Take 100 mg by mouth 2 (two) times daily.   Yes Historical Provider, MD  lisinopril (PRINIVIL,ZESTRIL) 20 MG tablet Take 20 mg by mouth daily.    Yes Historical Provider, MD  loratadine (CLARITIN) 10 MG tablet Take 10 mg by mouth daily.   Yes Historical Provider, MD  LORazepam (ATIVAN) 0.5 MG tablet Take 0.5 mg by mouth every 12 (twelve) hours as needed for anxiety.   Yes Historical Provider, MD  metoprolol succinate (TOPROL-XL) 25 MG 24 hr tablet Take 25 mg by mouth daily.   Yes Historical Provider, MD  nitroGLYCERIN (NITROSTAT) 0.6 MG SL tablet Place 0.6 mg under the tongue every 5 (five) minutes as needed for chest pain.   Yes Historical Provider, MD  omeprazole (PRILOSEC) 20 MG capsule Take 20 mg by mouth 2 (two) times daily.   Yes Historical Provider, MD  phenazopyridine (PYRIDIUM) 100 MG tablet Take 100 mg by mouth 3 (three) times daily with meals.   Yes Historical Provider, MD  senna-docusate (SENOKOT-S) 8.6-50 MG per tablet Take 1 tablet by mouth daily.    Yes Historical Provider, MD  tamsulosin (FLOMAX) 0.4 MG CAPS capsule Take 0.4 mg by mouth daily after supper.    Yes Historical Provider, MD  tobramycin (TOBREX) 0.3 % ophthalmic solution Place 1 drop into the left eye 4 (four) times daily. For 2 weeks following surgery. Starting 01/24/2014 01/23/14  Yes Historical Provider, MD  traMADol (ULTRAM) 50 MG tablet Take 50 mg by mouth See admin instructions. 50 mg every AM and every 8 hours as needed for severe pain   Yes Historical Provider, MD  zolpidem (AMBIEN) 5 MG tablet Take 2.5 mg by mouth at bedtime as needed for sleep.   Yes Historical Provider, MD   Physical Exam: Filed Vitals:   02/01/14 1526  BP: 129/53  Pulse: 64  Temp: 97.9 F (36.6 C)   Resp: 17    BP 129/53  Pulse 64  Temp(Src) 97.9 F (36.6 C) (Oral)  Resp 17  Ht 5\' 4"  (1.626 m)  Wt 58.968 kg (130 lb)  BMI 22.30 kg/m2  SpO2 96% Constitutional: Vital signs reviewed.  Patient is a well-developed and well-nourished in no acute distress and cooperative with exam. Alert and oriented x3.  Head: Normocephalic and atraumatic Mouth: no erythema or exudates, MMM Eyes: PERRL, EOMI, conjunctivae normal, No scleral icterus.  Neck: Supple, Trachea midline normal ROM, No JVD, mass, thyromegaly, or carotid bruit present.  Cardiovascular: RRR, S1 normal, S2 normal, no MRG, pulses symmetric and intact bilaterally Pulmonary/Chest: normal respiratory effort, CTAB, no wheezes, rales, or rhonchi Abdominal: Soft. Non-tender, non-distended, bowel sounds are normal,  no masses, organomegaly, or guarding present.  GU: no CVA tenderness Musculoskeletal: No joint deformities, erythema, or stiffness, ROM full and no nontender Hematology: no cervical, inginal, or axillary adenopathy.  Neurological: A&O x3, Strength is normal and symmetric bilaterally, cranial nerve II-XII are grossly intact, no focal motor deficit, sensory intact to light touch bilaterally.  Skin: Warm, dry and intact. No rash, cyanosis, or clubbing.  Psychiatric: Normal mood and affect. speech and behavior is normal. Judgment and thought content normal. Cognition and memory are normal.                Labs on Admission:  Basic Metabolic Panel:  Recent Labs Lab 02/01/14 1235  NA 137  K 4.1  CL 106  GLUCOSE 124*  BUN 22  CREATININE 1.20   Liver Function Tests: No results found for this basename: AST, ALT, ALKPHOS, BILITOT, PROT, ALBUMIN,  in the last 168 hours No results found for this basename: LIPASE, AMYLASE,  in the last 168 hours No results found for this basename: AMMONIA,  in the last 168 hours CBC:  Recent Labs Lab 02/01/14 1226 02/01/14 1235  WBC 8.9  --   HGB 13.1 13.3  HCT 39.2 39.0  MCV  100.0  --   PLT 186  --    Cardiac Enzymes: No results found for this basename: CKTOTAL, CKMB, CKMBINDEX, TROPONINI,  in the last 168 hours  BNP (last 3 results)  Recent Labs  02/01/14 1249  PROBNP 293.7   CBG: No results found for this basename: GLUCAP,  in the last 168 hours  Radiological Exams on Admission: Dg Chest 2 View  02/01/2014   CLINICAL DATA:  Near syncope  EXAM: CHEST  2 VIEW  COMPARISON:  10/19/2012  FINDINGS: Heart size is upper normal. Negative for heart failure. Left lower lobe airspace density similar to the prior study and most consistent with scarring. Negative for pleural effusion.  Severe compression fracture approximately T12 is unchanged.  IMPRESSION: Left lower lobe airspace disease is most consistent with scarring.   Electronically Signed   By: Marlan Palau M.D.   On: 02/01/2014 13:06    EKG: Independently reviewed. RBBB with sinus rhythm at 57, no acute ischemic changes  Assessment/Plan Active Problems: Present on Admission:  . Bradycardia & transient hypotension  -As discussed above, Possibly secondary to meds - will hold metoprolol and his other antihypertensives and now  - we'll cycle cardiac enzymes, also obtain d-dimer, TSH, 2-D echo and follow  -Patient also had some chest pain, possibly secondary to above/demand>> follow and consult cards in a.m. pending cardiac enzymes>> will keep n.p.o. after midnight for now -Gentle hydration with IV fluids, follow and further manage accordingly pending studies  . Near syncope -Possibly secondary to above, see workup as discussed above  .DOE -BNP is unimpressive her to 293.7 and chest x-ray in consistent with scarring  -Cardiac enzymes, 2-D echo d-dimer as above and follow . Dementia with behavioral disturbance -continue outpatient medications  . history of Essential hypertension -Hold meds-metoprolol and lisinopril for now>> follow and resume when appropriate, he may need the interpole to be DC'd or just  for dose to decreased pending clinical course . history of hypercholesterolemia -He is on no meds, will obtain fasting lipid profile and follow  . history of anxiety -Continue when necessary Ativan  . history of AAA (abdominal aortic aneurysm) without rupture -he is followed by cardiology, outpatient       Code Status: Full  Family Communication: Daughter at  bedside Disposition Plan: admit to tele for obsv  Time spent: >68mins  Kela Millin [facility] Pager [phone]

## 2014-02-01 NOTE — ED Notes (Signed)
Hannah, PA at the bedside.  

## 2014-02-01 NOTE — ED Notes (Signed)
Per [facility], pt is from [facility] and taken to eye MD for cataracts. Had a near syncopal episode while there, generalized weakness, nausea. EMS arrival, pt was pale and brady with rate of 40. Pt also reports some mild cp. Has a RBBB on EMS strip. [facility]

## 2014-02-01 NOTE — Progress Notes (Signed)
Dr. Suanne MarkerViyuoh paged and made aware of pt arrival to floor.

## 2014-02-02 ENCOUNTER — Encounter (HOSPITAL_COMMUNITY): Payer: Self-pay | Admitting: Radiology

## 2014-02-02 ENCOUNTER — Other Ambulatory Visit: Payer: Self-pay | Admitting: Physician Assistant

## 2014-02-02 ENCOUNTER — Observation Stay (HOSPITAL_COMMUNITY): Payer: Medicare Other

## 2014-02-02 DIAGNOSIS — I714 Abdominal aortic aneurysm, without rupture, unspecified: Secondary | ICD-10-CM

## 2014-02-02 DIAGNOSIS — R0609 Other forms of dyspnea: Secondary | ICD-10-CM

## 2014-02-02 DIAGNOSIS — R0989 Other specified symptoms and signs involving the circulatory and respiratory systems: Secondary | ICD-10-CM

## 2014-02-02 DIAGNOSIS — E7800 Pure hypercholesterolemia, unspecified: Secondary | ICD-10-CM

## 2014-02-02 DIAGNOSIS — R55 Syncope and collapse: Secondary | ICD-10-CM | POA: Diagnosis not present

## 2014-02-02 DIAGNOSIS — I379 Nonrheumatic pulmonary valve disorder, unspecified: Secondary | ICD-10-CM

## 2014-02-02 LAB — BASIC METABOLIC PANEL
Anion gap: 10 (ref 5–15)
BUN: 27 mg/dL — AB (ref 6–23)
CALCIUM: 8.9 mg/dL (ref 8.4–10.5)
CO2: 26 mEq/L (ref 19–32)
CREATININE: 1.1 mg/dL (ref 0.50–1.35)
Chloride: 103 mEq/L (ref 96–112)
GFR calc non Af Amer: 59 mL/min — ABNORMAL LOW (ref >90)
GFR, EST AFRICAN AMERICAN: 69 mL/min — AB (ref >90)
Glucose, Bld: 155 mg/dL — ABNORMAL HIGH (ref 70–99)
Potassium: 4.2 mEq/L (ref 3.7–5.3)
Sodium: 139 mEq/L (ref 137–147)

## 2014-02-02 LAB — CBC
HCT: 35.3 % — ABNORMAL LOW (ref 39.0–52.0)
Hemoglobin: 11.8 g/dL — ABNORMAL LOW (ref 13.0–17.0)
MCH: 33.4 pg (ref 26.0–34.0)
MCHC: 33.4 g/dL (ref 30.0–36.0)
MCV: 100 fL (ref 78.0–100.0)
PLATELETS: 176 10*3/uL (ref 150–400)
RBC: 3.53 MIL/uL — ABNORMAL LOW (ref 4.22–5.81)
RDW: 13.1 % (ref 11.5–15.5)
WBC: 6.4 10*3/uL (ref 4.0–10.5)

## 2014-02-02 LAB — TROPONIN I
Troponin I: <0.3 ng/mL (ref <0.30)
Troponin I: <0.3 ng/mL (ref <0.30)

## 2014-02-02 MED ORDER — IOHEXOL 350 MG/ML SOLN
100.0000 mL | Freq: Once | INTRAVENOUS | Status: AC | PRN
  Administered 2014-02-02: 100 mL via INTRAVENOUS

## 2014-02-02 MED ORDER — LISINOPRIL 10 MG PO TABS
10.0000 mg | ORAL_TABLET | Freq: Every day | ORAL | Status: DC
  Administered 2014-02-02 – 2014-02-03 (×2): 10 mg via ORAL
  Filled 2014-02-02 (×2): qty 1

## 2014-02-02 MED ORDER — ASPIRIN EC 81 MG PO TBEC
81.0000 mg | DELAYED_RELEASE_TABLET | Freq: Every day | ORAL | Status: DC
  Administered 2014-02-03: 81 mg via ORAL
  Filled 2014-02-02: qty 1

## 2014-02-02 MED ORDER — PNEUMOCOCCAL VAC POLYVALENT 25 MCG/0.5ML IJ INJ
0.5000 mL | INJECTION | INTRAMUSCULAR | Status: AC
  Administered 2014-02-03: 0.5 mL via INTRAMUSCULAR
  Filled 2014-02-02: qty 0.5

## 2014-02-02 NOTE — Progress Notes (Signed)
Physical Therapy Evaluation Patient Details Name: Andre Marsh MRN: [id number] DOB: 1927-03-26 Today's Date: 02/02/2014   History of Present Illness  Andre Marsh is a 80 y.o. male with history of hypertension, anxiety/depression, mild dementia, paralysis agitans cataracts, who was in his ophthalmologist today and had a presyncopal episode. He states that he just felt very weak, nauseous and per staff he had some lightheadedness and was pale and diaphoretic.  Recent chest pain and pulse 40 on arrival of EMT's. PMHx:  Bipolar 1 disorder, HTN, anxiety, Parkinson's, Alzheimer's, depression, falls, GERD, cataracts B   Clinical Impression  Pt was noted to have gait changes consistent with Parkinson's with L side neglect during mobility, demonstrating difficulty with short term memory to use call light.  Very cooperative and will benefit from therapy while inpatient to restore his ability to walk without assistance.    Follow Up Recommendations SNF;Supervision/Assistance - 24 hour    Equipment Recommendations  Other (comment)    Recommendations for Other Services Other (comment) (PT to follow up in SNF)     Precautions / Restrictions Precautions Precautions: Fall Restrictions Weight Bearing Restrictions: No      Mobility  Bed Mobility Overal bed mobility: Needs Assistance Bed Mobility: Sidelying to Sit;Sit to Supine   Sidelying to sit: Min guard   Sit to supine: Min guard      Transfers Overall transfer level: Needs assistance Equipment used: Rolling walker (2 wheeled) Transfers: Sit to/from [employer] Sit to Stand: Min guard Stand pivot transfers: Min guard       General transfer comment: impulsive and forgets hand placement to initiate and sit  Ambulation/Gait Ambulation/Gait assistance: Min guard Ambulation Distance (Feet): 150 Feet Assistive device: Rolling walker (2 wheeled) Gait Pattern/deviations: Narrow base of support;Trunk flexed;Drifts  right/left;Shuffle;Decreased dorsiflexion - right;Decreased dorsiflexion - left;Decreased stance time - left;Decreased step length - left;Decreased step length - right Gait velocity: functional Gait velocity interpretation: at or above normal speed for age/gender General Gait Details: narrow base with list to L on RW, without walker is off to L and ignores obstacles on L, tends to run into obstacles if not corrected   Stairs            Wheelchair Mobility    Modified Rankin (Stroke Patients Only)       Balance Overall balance assessment: Needs assistance Sitting-balance support: Feet supported Sitting balance-Leahy Scale: Fair   Postural control: Left lateral lean Standing balance support: Bilateral upper extremity supported Standing balance-Leahy Scale: Poor Standing balance comment: Cues for use of walker for balance and tends to set aside to walk with nothing, list to L into flexed posture                             Pertinent Vitals/Pain Pain Assessment: No/denies pain BP 179/63, pulse 57 and O2 sat 95% at rest per nsg.    Home Living Family/patient expects to be discharged to:: Skilled nursing facility                      Prior Function Level of Independence: Needs assistance   Gait / Transfers Assistance Needed: SNF assistance with no clear history from pt  ADL's / Homemaking Assistance Needed: Staff assistance at SNF        Hand Dominance   Dominant Hand: Right    Extremity/Trunk Assessment   Upper Extremity Assessment: Overall WFL for tasks assessed  Lower Extremity Assessment: Generalized weakness      Cervical / Trunk Assessment: Kyphotic  Communication   Communication: No difficulties;Other (comment) (history may be off as pt has ALZ)  Cognition Arousal/Alertness: Awake/alert Behavior During Therapy: WFL for tasks assessed/performed Overall Cognitive Status: History of cognitive impairments - at baseline        Memory: Decreased recall of precautions;Decreased short-term memory              General Comments General comments (skin integrity, edema, etc.): LTC resident with clear safety concerns as he cannot remember how to use call light after being instructed    Exercises        Assessment/Plan    PT Assessment Patient needs continued PT services  PT Diagnosis     PT Problem List Decreased strength;Decreased range of motion;Decreased balance;Decreased mobility;Decreased coordination;Decreased cognition;Decreased knowledge of use of DME;Decreased safety awareness;Decreased knowledge of precautions;Cardiopulmonary status limiting activity;Other (comment) (limited recall of precautions)  PT Treatment Interventions DME instruction;Gait training;Functional mobility training;Therapeutic activities;Therapeutic exercise;Balance training;Neuromuscular re-education;Patient/family education   PT Goals (Current goals can be found in the Care Plan section) Acute Rehab PT Goals Patient Stated Goal: none stated PT Goal Formulation: With patient Time For Goal Achievement: 02/09/14 Potential to Achieve Goals: Good    Frequency Min 3X/week   Barriers to discharge Other (comment) (none, has access to care in SNF)      Co-evaluation               End of Session   Activity Tolerance: Patient tolerated treatment well Patient left: in chair;with call bell/phone within reach;Other (comment) Nurse Communication: Mobility status;Other (comment) (Safety)         Time: [id number] PT Time Calculation (min): 26 min   Charges:   PT Evaluation $Initial PT Evaluation Tier I: 1 Procedure PT Treatments $Gait Training: 8-22 mins   PT G Codes:          Ivar DrapeStout, Varshini Arrants E 02/02/2014, 8:49 AM  Samul Dadauth Kristy Catoe, PT MS Acute Rehab Dept. Number: [phone]

## 2014-02-02 NOTE — Progress Notes (Signed)
UR completed 

## 2014-02-02 NOTE — ED Provider Notes (Signed)
Medical screening examination/treatment/procedure(s) were performed by non-physician practitioner and as supervising physician I was immediately available for consultation/collaboration.   EKG Interpretation   Date/Time:  Wednesday February 01 2014 12:08:19 EDT Ventricular Rate:  57 PR Interval:  124 QRS Duration: 132 QT Interval:  462 QTC Calculation: 450 R Axis:   30 Text Interpretation:  Sinus rhythm Right bundle branch block No  significant change since last tracing Confirmed by Rubin PayorPICKERING  MD, Harrold DonathNATHAN  [phone]) on 02/01/2014 12:47:33 PM     Patient with chest pain and near-syncope. EKG reassuring. Admitted to medicine.  Juliet RudeNathan R. Rubin PayorPickering, MD 02/02/14 [phone]

## 2014-02-02 NOTE — Progress Notes (Signed)
OT Cancellation Note  Patient Details Name: Greig CastillaHenry P Morath MRN: [id number] DOB: 04/01/1927   Cancelled Treatment:    Reason Eval/Treat Not Completed: Other (comment) Pt is Medicare/Medicaid and current D/C plan is SNF. No apparent immediate acute care OT needs, therefore will defer OT to SNF. If OT eval is needed please call Acute Rehab Dept. at [phone] or text page OT at [phone].  [facility] LLCWARD,HILLARY Jaielle Dlouhy, OTR/L  [phone] 02/02/2014 02/02/2014, 9:45 AM

## 2014-02-02 NOTE — Progress Notes (Signed)
Pt.'s HR down to 42-43 while asleep last night.  Pt. asymtomatic,

## 2014-02-02 NOTE — Progress Notes (Signed)
[facility] PROGRESS NOTE  Andre Marsh WGN:[id number] DOB: 09/23/1926 DOA: 02/01/2014 PCP: Karlene EinsteinASANAYAKA,GAYANI, MD  Assessment/Plan: . Bradycardia & transient hypotension  -Nodal blocking agents on hold for now - we'll cycle cardiac enzymes, also obtain d-dimer' - 2-D echo done, pending results  Chest Pain -Serial trop neg -2D echo done, awaiting results -D-dimer was mildly elevated with f/u CTA chest neg for PE - Reported intermittent recurrent chest pains that were not reproducible on exam earlier this AM - Given risk factors, have consulted Cardiology for assistance  .DOE  -BNP on admit of 293.7 and chest x-ray in consistent with scarring  -2d echo results pending - On minimal O2 support  . Dementia with behavioral disturbance -continue outpatient medications  -stable currently  . Essential hypertension  -Holding metoprolol and lisinopril for now -BP overall stable, rising -Will resume lower dose of lisinopril (was on 20mg , will resume at 10mg )  . history of hypercholesterolemia  -He is on no hyperlipidemic meds -will obtain fasting lipid profile  . history of anxiety  -Continue PRN Ativan   . history of AAA (abdominal aortic aneurysm) without rupture  -he is followed by cardiology, outpatient  -denies abd pain  Code Status: Full Family Communication: Pt in room (indicate person spoken with, relationship, and if by phone, the number) Disposition Plan: Pending   Consultants:  Cardiology  Procedures:    Antibiotics:   (indicate start date, and stop date if known)  HPI/Subjective: Noted continued intermittent chest pain earlier this AM. Was self-limiting  Objective: Filed Vitals:   02/01/14 1526 02/01/14 2003 02/02/14 0521 02/02/14 1313  BP: 129/53 116/52 179/63 130/52  Pulse: 64 52 57 66  Temp: 97.9 F (36.6 C) 98 F (36.7 C) 98.1 F (36.7 C) 97.4 F (36.3 C)  TempSrc: Oral Oral Oral Oral  Resp: 17 18 18 18   Height:      Weight:   57.38  kg (126 lb 8 oz)   SpO2: 96% 92% 95% 95%    Intake/Output Summary (Last 24 hours) at 02/02/14 1607 Last data filed at 02/02/14 1330  Gross per 24 hour  Intake    363 ml  Output    875 ml  Net   -512 ml   Filed Weights   02/01/14 1200 02/02/14 0521  Weight: 58.968 kg (130 lb) 57.38 kg (126 lb 8 oz)    Exam:   General:  Awake, in nad  Cardiovascular: regular,s 1,s 2  Respiratory: normal resp effort, no wheezing  Abdomen: soft, nondistended  Musculoskeletal: perfused, no clubbing   Data Reviewed: Basic Metabolic Panel:  Recent Labs Lab 02/01/14 1235 02/02/14 0002  NA 137 139  K 4.1 4.2  CL 106 103  CO2  --  26  GLUCOSE 124* 155*  BUN 22 27*  CREATININE 1.20 1.10  CALCIUM  --  8.9   Liver Function Tests: No results found for this basename: AST, ALT, ALKPHOS, BILITOT, PROT, ALBUMIN,  in the last 168 hours No results found for this basename: LIPASE, AMYLASE,  in the last 168 hours No results found for this basename: AMMONIA,  in the last 168 hours CBC:  Recent Labs Lab 02/01/14 1226 02/01/14 1235 02/02/14 0002  WBC 8.9  --  6.4  HGB 13.1 13.3 11.8*  HCT 39.2 39.0 35.3*  MCV 100.0  --  100.0  PLT 186  --  176   Cardiac Enzymes:  Recent Labs Lab 02/01/14 1845 02/02/14 0002 02/02/14 0651  TROPONINI <0.30 <0.30 <0.30  BNP (last 3 results)  Recent Labs  02/01/14 1249  PROBNP 293.7   CBG: No results found for this basename: GLUCAP,  in the last 168 hours  No results found for this or any previous visit (from the past 240 hour(s)).   Studies: Dg Chest 2 View  02/01/2014   CLINICAL DATA:  Near syncope  EXAM: CHEST  2 VIEW  COMPARISON:  10/19/2012  FINDINGS: Heart size is upper normal. Negative for heart failure. Left lower lobe airspace density similar to the prior study and most consistent with scarring. Negative for pleural effusion.  Severe compression fracture approximately T12 is unchanged.  IMPRESSION: Left lower lobe airspace disease is  most consistent with scarring.   Electronically Signed   By: Marlan Palau M.D.   On: 02/01/2014 13:06   Ct Angio Chest Pe W/cm &/or Wo Cm  02/02/2014   CLINICAL DATA:  Elevated D-dimer, possible PE  EXAM: CT ANGIOGRAPHY CHEST WITH CONTRAST  TECHNIQUE: Multidetector CT imaging of the chest was performed using the standard protocol during bolus administration of intravenous contrast. Multiplanar CT image reconstructions and MIPs were obtained to evaluate the vascular anatomy.  CONTRAST:  OMNIPAQUE IOHEXOL 350 MG/ML SOLN  COMPARISON:  None  FINDINGS: Sagittal images of the spine shows degenerative changes thoracic spine.  Cardiomegaly is noted. Atherosclerotic calcifications of thoracic aorta and coronary arteries. There is moderate size hiatal hernia. The patient is status postcholecystectomy.  No pulmonary embolus is noted. The study is of excellent technical quality. No mediastinal hematoma or adenopathy.  Images of the lung parenchyma shows no acute infiltrate or pulmonary edema. There is geographic mild parenchymal ground-glass attenuation probable due to hypoventilatory changes. No segmental infiltrate. No pneumothorax. Mild interstitial prominence bilateral lower lobe probable chronic in nature.  Review of the MIP images confirms the above findings.  IMPRESSION: 1. No pulmonary embolus is noted. 2. Atherosclerotic calcifications of thoracic aorta and coronary arteries. 3. No mediastinal hematoma or adenopathy. 4. No segmental infiltrate or pulmonary edema. Mild patchy ground-glass attenuation bilaterally probable due to hypoventilatory changes. Mild interstitial prominence bilaterally probable chronic in nature.   Electronically Signed   By: Natasha Mead M.D.   On: 02/02/2014 12:44    Scheduled Meds: . aspirin  325 mg Oral Daily  . budesonide  9 mg Oral BH-q7a  . enoxaparin (LOVENOX) injection  40 mg Subcutaneous Q24H  . ENSURE  250 mL Oral QHS  . folic acid  1 mg Oral Daily  . galantamine  8  mg Oral Q breakfast  . isosorbide mononitrate  30 mg Oral Daily  . lamoTRIgine  100 mg Oral BID  . loratadine  10 mg Oral Daily  . nepafenac  1 drop Left Eye TID  . paliperidone  3 mg Oral QHS  . pantoprazole  40 mg Oral Daily  . prednisoLONE acetate  1 drop Left Eye QID  . senna-docusate  1 tablet Oral Daily  . sodium chloride  3 mL Intravenous Q12H  . tamsulosin  0.4 mg Oral QPC supper  . tobramycin  1 drop Left Eye QID  . traMADol  50 mg Oral q morning - 10a   Continuous Infusions:   Active Problems:   Pure hypercholesterolemia   Essential hypertension, benign   Bipolar disorder, unspecified   AAA (abdominal aortic aneurysm) without rupture   Dementia with behavioral disturbance   Near syncope   Hypotension  Time spent:  CHIU, STEPHEN K  [facility] Pager [phone]. If 7PM-7AM, please  contact night-coverage at [email or web address], password [email or web address] 02/02/2014, 4:07 PM  LOS: 1 day

## 2014-02-02 NOTE — Progress Notes (Signed)
  Echocardiogram 2D Echocardiogram has been performed.  Leta JunglingCooper, Candia Kingsbury M 02/02/2014, 3:47 PM

## 2014-02-02 NOTE — Consult Note (Signed)
Cardiology Consultation Note  Patient ID: Andre Marsh, MRN: [id number], DOB/AGE: 1927-05-10 80 y.o. Admit date: 02/01/2014   Date of Consult: 02/02/2014 Primary Physician: Karlene Einstein, MD Primary Cardiologist: Dr. Lisbeth Ply in [facility]  Chief Complaint: Weakness/lightheadedness/nausea Reason for Consult: Chest pain/bradycardia/presyncope  HPI:  80 y/o M with h/o reported nonobstructive CAD (patient reported cath 2014 @ [facility]), HTN, onset of DOE within the past 6-12 months, AAA, HL, mild dementia, paralysis agitans, and anxiety/depression who presented to [facility] [place] 02/01/14 after having a presyncopal episode while in his ophthalmologist's office.  Patient with h/o intermittent chest pain for the past 2 years which brought him to his primary cardiologist, Dr. Lisbeth Ply, in [place]. At that time he reports having had a stress test (2013). Patient does not recall the results. His intermittent chest pains persisted prompting him to undergo heart cath (2014), which he reports as nonobstructive. He has not seen his cardiologist since. He has continued to have intermittent chest pain, SOB, and DOE; however no palpitations, presyncope, or syncope have ever been associated with his prior symptoms.   On 8/19 while at his ophthalmologist's office undergoing a routine cataract OV he developed sudden onset of weakness, lightheadedness, and nausea-feeling like he was going to pass out. This occurred during his eye exam. He did not actually have a syncopal episode. He denies any chest pain during this episode. Denies any palpitations before or after the episode. EMS was called to the scene where patient's HR was found to be in the 40s (rhythm unknown). He was transported to [facility] where the first documented BP was 95/70. EKG sinus bradycardia, 57, RBBB, no st/t changes. Troponin negative x 3. Hgb 13.1. K+ 4.2. D dimer 1.19. Upon admission his Toprol-XL 25 mg and lisinopril 20 mg were held 2/2 bradycardia and transient hypotension. He was  started on IVF for gentle hydration. CTA of the chest without PE, some atherosclerotic calcifications of thoracic aorta and coronary arteries noted, no pulmonary edema. Echo has been ordered. He is not aware of the diameter of his AAA.       Past Medical History  Diagnosis Date  . Bipolar 1 disorder   . Hypertension   . Anxiety   . Paralysis agitans   . Alzheimer disease   . Depression   . Fall   . GERD (gastroesophageal reflux disease)   . Cataract     bilateral  . AAA (abdominal aortic aneurysm)       Most Recent Cardiac Studies: None in [facility]   Surgical History:  Past Surgical History  Procedure Laterality Date  . Cataract extraction Left      Home Meds: Prior to Admission medications   Medication Sig Start Date End Date Taking? Authorizing Provider  acetaminophen (TYLENOL) 325 MG tablet Take 650 mg by mouth every 4 (four) hours as needed for mild pain.   Yes Historical Provider, MD  aspirin 325 MG EC tablet Take 325 mg by mouth daily.   Yes Historical Provider, MD  budesonide (ENTOCORT EC) 3 MG 24 hr capsule Take 9 mg by mouth every morning.   Yes Historical Provider, MD  docusate sodium (COLACE) 100 MG capsule Take 100 mg by mouth daily.   Yes Historical Provider, MD  DUREZOL 0.05 % EMUL Place 1 drop into the left eye 2 (two) times daily. For 1 week, starting 8/19. 01/23/14  Yes Historical Provider, MD  ENSURE (ENSURE) Take 237 mLs by mouth at bedtime. With snack   Yes Historical Provider, MD  folic acid (  FOLVITE) 1 MG tablet Take 1 mg by mouth daily.   Yes Historical Provider, MD  galantamine (RAZADYNE ER) 8 MG 24 hr capsule Take 8 mg by mouth daily with breakfast.   Yes Historical Provider, MD  ILEVRO 0.3 % SUSP Place 1 drop into the left eye at bedtime. For 4 weeks, starting 01/24/2014 01/07/14  Yes Historical Provider, MD  INVEGA 3 MG 24 hr tablet Take 3 mg by mouth at bedtime. For paranoia and mood 01/12/14  Yes Historical Provider, MD  isosorbide mononitrate (IMDUR) 30 MG  24 hr tablet Take 30 mg by mouth daily.   Yes Historical Provider, MD  lamoTRIgine (LAMICTAL) 100 MG tablet Take 100 mg by mouth 2 (two) times daily.   Yes Historical Provider, MD  lisinopril (PRINIVIL,ZESTRIL) 20 MG tablet Take 20 mg by mouth daily.    Yes Historical Provider, MD  loratadine (CLARITIN) 10 MG tablet Take 10 mg by mouth daily.   Yes Historical Provider, MD  LORazepam (ATIVAN) 0.5 MG tablet Take 0.5 mg by mouth every 12 (twelve) hours as needed for anxiety.   Yes Historical Provider, MD  metoprolol succinate (TOPROL-XL) 25 MG 24 hr tablet Take 25 mg by mouth daily.   Yes Historical Provider, MD  nitroGLYCERIN (NITROSTAT) 0.6 MG SL tablet Place 0.6 mg under the tongue every 5 (five) minutes as needed for chest pain.   Yes Historical Provider, MD  omeprazole (PRILOSEC) 20 MG capsule Take 20 mg by mouth 2 (two) times daily.   Yes Historical Provider, MD  phenazopyridine (PYRIDIUM) 100 MG tablet Take 100 mg by mouth 3 (three) times daily with meals.   Yes Historical Provider, MD  senna-docusate (SENOKOT-S) 8.6-50 MG per tablet Take 1 tablet by mouth daily.    Yes Historical Provider, MD  tamsulosin (FLOMAX) 0.4 MG CAPS capsule Take 0.4 mg by mouth daily after supper.    Yes Historical Provider, MD  tobramycin (TOBREX) 0.3 % ophthalmic solution Place 1 drop into the left eye 4 (four) times daily. For 2 weeks following surgery. Starting 01/24/2014 01/23/14  Yes Historical Provider, MD  traMADol (ULTRAM) 50 MG tablet Take 50 mg by mouth See admin instructions. 50 mg every AM and every 8 hours as needed for severe pain   Yes Historical Provider, MD  zolpidem (AMBIEN) 5 MG tablet Take 2.5 mg by mouth at bedtime as needed for sleep.   Yes Historical Provider, MD    Inpatient Medications:  . aspirin  325 mg Oral Daily  . budesonide  9 mg Oral BH-q7a  . enoxaparin (LOVENOX) injection  40 mg Subcutaneous Q24H  . ENSURE  250 mL Oral QHS  . folic acid  1 mg Oral Daily  . galantamine  8 mg Oral Q  breakfast  . isosorbide mononitrate  30 mg Oral Daily  . lamoTRIgine  100 mg Oral BID  . lisinopril  10 mg Oral Daily  . loratadine  10 mg Oral Daily  . nepafenac  1 drop Left Eye TID  . paliperidone  3 mg Oral QHS  . pantoprazole  40 mg Oral Daily  . prednisoLONE acetate  1 drop Left Eye QID  . senna-docusate  1 tablet Oral Daily  . sodium chloride  3 mL Intravenous Q12H  . tamsulosin  0.4 mg Oral QPC supper  . tobramycin  1 drop Left Eye QID  . traMADol  50 mg Oral q morning - 10a      Allergies:  Allergies  Allergen Reactions  .  Ciprofloxacin Hcl Nausea And Vomiting  . Cocoa Other (See Comments)    Unknown; listed in MAR  . Codeine Other (See Comments)    Unknown; listed in MAR  . Hydrocodone Other (See Comments)    Unknown; listed in MAR  . Librax [Chlordiazepoxide-Clidinium] Other (See Comments)    Unknown; listed in MAR  . Loperamide Hcl Other (See Comments)    Unknown; listed in MAR  . Metaxalone Other (See Comments)    Unknown; listed in MAR  . Penicillins Other (See Comments)    Unknown; listed in MAR  . Sulfa Antibiotics Nausea And Vomiting and Rash  . Tetracyclines & Related Rash    History   Social History  . Marital Status: Widowed    Spouse Name: N/A    Number of Children: N/A  . Years of Education: N/A   Occupational History  . Not on file.   Social History Main Topics  . Smoking status: Never Smoker   . Smokeless tobacco: Not on file  . Alcohol Use: Yes     Comment: wine occasionally  . Drug Use: Not on file  . Sexual Activity: Not on file   Other Topics Concern  . Not on file   Social History Narrative  . No narrative on file     Family History  Problem Relation Age of Onset  . Heart failure Mother      Review of Systems: General: negative for chills, fever, night sweats or weight changes.  Cardiovascular: negative for edema, orthopnea, palpitations, or paroxysmal nocturnal dyspnea Dermatological: negative for  rash Respiratory: negative for cough or wheezing Urologic: negative for hematuria Abdominal: negative for vomiting, diarrhea, bright red blood per rectum, melena, or hematemesis Neurologic: negative for visual changes, syncope, or dizziness All other systems reviewed and are otherwise negative except as noted above.  Labs:  Recent Labs  02/01/14 1845 02/02/14 0002 02/02/14 0651  TROPONINI <0.30 <0.30 <0.30   Lab Results  Component Value Date   WBC 6.4 02/02/2014   HGB 11.8* 02/02/2014   HCT 35.3* 02/02/2014   MCV 100.0 02/02/2014   PLT 176 02/02/2014     Recent Labs Lab 02/02/14 0002  NA 139  K 4.2  CL 103  CO2 26  BUN 27*  CREATININE 1.10  CALCIUM 8.9  GLUCOSE 155*   Lab Results  Component Value Date   CHOL 209* 12/17/2010   HDL 33* 12/17/2010   LDLCALC 124* 12/17/2010   TRIG 258* 12/17/2010   Lab Results  Component Value Date   DDIMER 1.19* 02/01/2014    Radiology/Studies:  Dg Chest 2 View  02/01/2014   CLINICAL DATA:  Near syncope  EXAM: CHEST  2 VIEW  COMPARISON:  10/19/2012  FINDINGS: Heart size is upper normal. Negative for heart failure. Left lower lobe airspace density similar to the prior study and most consistent with scarring. Negative for pleural effusion.  Severe compression fracture approximately T12 is unchanged.  IMPRESSION: Left lower lobe airspace disease is most consistent with scarring.   Electronically Signed   By: Marlan Palau M.D.   On: 02/01/2014 13:06   Ct Angio Chest Pe W/cm &/or Wo Cm  02/02/2014   CLINICAL DATA:  Elevated D-dimer, possible PE  EXAM: CT ANGIOGRAPHY CHEST WITH CONTRAST  TECHNIQUE: Multidetector CT imaging of the chest was performed using the standard protocol during bolus administration of intravenous contrast. Multiplanar CT image reconstructions and MIPs were obtained to evaluate the vascular anatomy.  CONTRAST:  OMNIPAQUE IOHEXOL  350 MG/ML SOLN  COMPARISON:  None  FINDINGS: Sagittal images of the spine shows degenerative  changes thoracic spine.  Cardiomegaly is noted. Atherosclerotic calcifications of thoracic aorta and coronary arteries. There is moderate size hiatal hernia. The patient is status postcholecystectomy.  No pulmonary embolus is noted. The study is of excellent technical quality. No mediastinal hematoma or adenopathy.  Images of the lung parenchyma shows no acute infiltrate or pulmonary edema. There is geographic mild parenchymal ground-glass attenuation probable due to hypoventilatory changes. No segmental infiltrate. No pneumothorax. Mild interstitial prominence bilateral lower lobe probable chronic in nature.  Review of the MIP images confirms the above findings.  IMPRESSION: 1. No pulmonary embolus is noted. 2. Atherosclerotic calcifications of thoracic aorta and coronary arteries. 3. No mediastinal hematoma or adenopathy. 4. No segmental infiltrate or pulmonary edema. Mild patchy ground-glass attenuation bilaterally probable due to hypoventilatory changes. Mild interstitial prominence bilaterally probable chronic in nature.   Electronically Signed   By: Natasha MeadLiviu  Pop M.D.   On: 02/02/2014 12:44    EKG: sinus bradycardia, 57, RBBB, no st/t changes  Physical Exam: Blood pressure 130/52, pulse 66, temperature 97.4 F (36.3 C), temperature source Oral, resp. rate 18, height 5\' 4"  (1.626 m), weight 126 lb 8 oz (57.38 kg), SpO2 95.00%. General: Well developed, well nourished, in no acute distress. Head: Normocephalic, atraumatic, sclera non-icteric, no xanthomas, nares are without discharge.  Neck: Negative for carotid bruits. JVD not elevated. Lungs: Clear bilaterally to auscultation without wheezes, rales, or rhonchi. Breathing is unlabored. Heart: RRR with S1 S2. No rubs, or gallops appreciated. 1/6 systolic murmur LUSB.  Abdomen: Soft, non-tender, non-distended with normoactive bowel sounds. No hepatomegaly. No rebound/guarding. No obvious abdominal masses. Msk:  Strength and tone appear normal for  age. Extremities: No clubbing or cyanosis. No edema.  Distal pedal pulses are 2+ and equal bilaterally. Neuro: Alert and oriented X 3. No facial asymmetry. No focal deficit. Moves all extremities spontaneously. Psych:  Responds to questions appropriately with a normal affect.    Assessment and Plan:  80 y/o M with h/o reported h/o nonobstructive CAD, HTN, onset of DOE within the past 6-12 months, AAA, HL, mild dementia, paralysis agitans, and anxiety/depression who [facility] after having a presyncopal episode while in his ophthalmologist's office found to have intermittent chest pain. Per EMS HR in the 40s (rhythm unknown) on their arrival, BP 95/70 in the ED upon arrival. Intermittent chest pain. Negative troponin x 3. Hgb 13.1 EKG with sinus bradycardia, 57, RBBB, no st/t changes. His metoprolol was held upon admission.    1. Presyncope/bradycardia/transient hypotension -With reported HR of 40s at ophthalmology office (rhythm unknown) -Metoprolol has been held since admission, HR low to mid 50s- mid 60s, BP improved with gentle hydration, currently 130/52 -Echo pending-if EF is nl without WMA given negative serial troponin x 3 would not pursue further ischemic cardiac workup, if EF is markedly decreased or if significant WMA may pursue ischemic work up -Telemetry sinus brady w/ rates in the 50s -Consider outpatient monitor  -K+ 4.2, Mg added  2. Intermittent chest pain x 2 years/DOE x 6 months -[facility] -Await echo at this time-if EF is decreased or significant WMA will pursue further ischemic workup -ProBNP 293.7. CXR c/w scarring -CTA chest: Negative for PE, atherosclerotic calcifications of thoracic aorta and coronary arteries. -On aspirin, currently 325 mg at home would decrease  to 81 mg daily unless needed otherwise  3. AAA -Without rupture -Follow up with  PCP as outpatient   4. HTN -Previously on metoprolol and lisinopril-held while inpatient, monitor BP -Discharge BP regimen may be dictated pending his echo  5. HL -Not on statin, obtain FLP   Signed, DUNN,RYAN PA-C 02/02/2014, 4:44 PM Agree with assessment and plan as noted above by Eula Listen PA-C.  His echocardiogram results are pending.  The patient has had no further dizziness or syncope since admission.  His heart rate has improved with holding his beta blocker.  His presenting symptoms are consistent with vasovagal near-syncope.  If his echocardiogram shows no significant wall motion abnormalities or severe left ventricular dysfunction, he would not need further ischemic workup during his hospital stay in view of the fact that he had a cardiac catheterization a year ago at [facility] showing nonobstructive disease.  Would consider outpatient event monitor after discharge.  Here in the hospital he has not had any significant arrhythmias other than improving sinus bradycardia.  Depending on blood pressures tomorrow, he may need to be discharged on a lower dose of ACE inhibitor than his previous home dose.

## 2014-02-03 DIAGNOSIS — F319 Bipolar disorder, unspecified: Secondary | ICD-10-CM

## 2014-02-03 DIAGNOSIS — R001 Bradycardia, unspecified: Secondary | ICD-10-CM | POA: Diagnosis present

## 2014-02-03 DIAGNOSIS — R55 Syncope and collapse: Secondary | ICD-10-CM | POA: Diagnosis not present

## 2014-02-03 LAB — LIPID PANEL
CHOLESTEROL: 191 mg/dL (ref 0–200)
HDL: 40 mg/dL (ref >39)
LDL CALC: 119 mg/dL — AB (ref 0–99)
Total CHOL/HDL Ratio: 4.8 RATIO
Triglycerides: 160 mg/dL — ABNORMAL HIGH (ref <150)
VLDL: 32 mg/dL (ref 0–40)

## 2014-02-03 LAB — BASIC METABOLIC PANEL
Anion gap: 12 (ref 5–15)
BUN: 18 mg/dL (ref 6–23)
CHLORIDE: 103 meq/L (ref 96–112)
CO2: 26 mEq/L (ref 19–32)
CREATININE: 1.05 mg/dL (ref 0.50–1.35)
Calcium: 9.4 mg/dL (ref 8.4–10.5)
GFR calc Af Amer: 73 mL/min — ABNORMAL LOW (ref >90)
GFR calc non Af Amer: 63 mL/min — ABNORMAL LOW (ref >90)
Glucose, Bld: 87 mg/dL (ref 70–99)
Potassium: 4.4 mEq/L (ref 3.7–5.3)
Sodium: 141 mEq/L (ref 137–147)

## 2014-02-03 LAB — MAGNESIUM: Magnesium: 2.2 mg/dL (ref 1.5–2.5)

## 2014-02-03 MED ORDER — LISINOPRIL 10 MG PO TABS: 10.0000 mg | ORAL_TABLET | Freq: Every day | ORAL | Status: DC

## 2014-02-03 MED ORDER — SODIUM CHLORIDE 0.9 % IV BOLUS (SEPSIS)
500.0000 mL | Freq: Once | INTRAVENOUS | Status: AC
  Administered 2014-02-03: 500 mL via INTRAVENOUS

## 2014-02-03 MED ORDER — ATORVASTATIN CALCIUM 20 MG PO TABS: 20.0000 mg | ORAL_TABLET | Freq: Every day | ORAL | Status: DC

## 2014-02-03 MED ORDER — ATORVASTATIN CALCIUM 20 MG PO TABS
20.0000 mg | ORAL_TABLET | Freq: Every day | ORAL | Status: DC
  Filled 2014-02-03: qty 1

## 2014-02-03 NOTE — Discharge Summary (Signed)
Physician Discharge Summary  Andre Marsh VQQ:[id number] DOB: 10/28/1926 DOA: 02/01/2014  PCP: Karlene EinsteinASANAYAKA,GAYANI, MD  Admit date: 02/01/2014 Discharge date: 02/03/2014  Time spent: 35 minutes  Recommendations for Outpatient Follow-up:  1. Follow up with PCP in 1-2 weeks 2. Cardiology recommendations for outpatient event monitor to be arranged through PCP  Discharge Diagnoses:  Principal Problem:   Near syncope Active Problems:   Pure hypercholesterolemia   Essential hypertension, benign   Bipolar disorder, unspecified   AAA (abdominal aortic aneurysm) without rupture   Dementia with behavioral disturbance   Hypotension   Bradycardia   Discharge Condition: Stable  Diet recommendation: Heart Healthy  Filed Weights   02/01/14 1200 02/02/14 0521 02/03/14 0440  Weight: 58.968 kg (130 lb) 57.38 kg (126 lb 8 oz) 60.056 kg (132 lb 6.4 oz)    History of present illness:  See admit h and p from 8/19 for details. Briefly, pt presents with chest discomfort with and a presyncopal episode at an ophthalmologist appt prior to admit. Pt was admitted for further work up.  Hospital Course:  . Bradycardia & transient hypotension  - Nodal blocking agents on hold for now  - Cardiac enzymes serially neg - D-dimer was elevated with follow up CTA neg for PE - 2-D echo done, and was unremarkable - Cardiology was consulted, per below - Recommendations for outpatient event monitor, to be arranged through PCP Chest Pain  -Serial trop neg  -2D echo done, unremarkable results -D-dimer was mildly elevated with f/u CTA chest neg for PE    .DOE  -BNP on admit of 293.7 and chest x-ray in consistent with scarring  -2d echo results pending  - On minimal O2 support   . Dementia with behavioral disturbance -continue outpatient medications  -stable currently   . Essential hypertension  -Holding metoprolol and lisinopril for now  -BP overall stable, rising  -Resumed lower dose of lisinopril (was on  20mg , resume at 10mg )   . history of hypercholesterolemia  -He is on no hyperlipidemic meds  -will obtain fasting lipid profile   . history of anxiety  -Continue PRN Ativan   . history of AAA (abdominal aortic aneurysm) without rupture  -he is followed by cardiology, outpatient  -denies abd pain  Consultations:  Cardiology  Discharge Exam: Filed Vitals:   02/02/14 1732 02/02/14 1927 02/03/14 0440 02/03/14 1033  BP: 145/62 135/59 155/60 147/69  Pulse:  58 63   Temp:  98.1 F (36.7 C) 98.1 F (36.7 C)   TempSrc:  Oral Oral   Resp:  18 18   Height:      Weight:   60.056 kg (132 lb 6.4 oz)   SpO2:  95% 92%     General: Awake, in nad Cardiovascular: regular,s 1, s2 Respiratory:  Normal resp effort, no wheezing  Discharge Instructions     Medication List    STOP taking these medications       metoprolol succinate 25 MG 24 hr tablet  Commonly known as:  TOPROL-XL      TAKE these medications       acetaminophen 325 MG tablet  Commonly known as:  TYLENOL  Take 650 mg by mouth every 4 (four) hours as needed for mild pain.     aspirin 325 MG EC tablet  Take 325 mg by mouth daily.     atorvastatin 20 MG tablet  Commonly known as:  LIPITOR  Take 1 tablet (20 mg total) by mouth daily at 6 PM.  budesonide 3 MG 24 hr capsule  Commonly known as:  ENTOCORT EC  Take 9 mg by mouth every morning.     docusate sodium 100 MG capsule  Commonly known as:  COLACE  Take 100 mg by mouth daily.     DUREZOL 0.05 % Emul  Generic drug:  Difluprednate  Place 1 drop into the left eye 2 (two) times daily. For 1 week, starting 8/19.     ENSURE  Take 237 mLs by mouth at bedtime. With snack     folic acid 1 MG tablet  Commonly known as:  FOLVITE  Take 1 mg by mouth daily.     galantamine 8 MG 24 hr capsule  Commonly known as:  RAZADYNE ER  Take 8 mg by mouth daily with breakfast.     ILEVRO 0.3 % Susp  Generic drug:  Nepafenac  Place 1 drop into the left eye at  bedtime. For 4 weeks, starting 01/24/2014     INVEGA 3 MG 24 hr tablet  Generic drug:  paliperidone  Take 3 mg by mouth at bedtime. For paranoia and mood     isosorbide mononitrate 30 MG 24 hr tablet  Commonly known as:  IMDUR  Take 30 mg by mouth daily.     lamoTRIgine 100 MG tablet  Commonly known as:  LAMICTAL  Take 100 mg by mouth 2 (two) times daily.     lisinopril 10 MG tablet  Commonly known as:  PRINIVIL,ZESTRIL  Take 1 tablet (10 mg total) by mouth daily.     loratadine 10 MG tablet  Commonly known as:  CLARITIN  Take 10 mg by mouth daily.     LORazepam 0.5 MG tablet  Commonly known as:  ATIVAN  Take 0.5 mg by mouth every 12 (twelve) hours as needed for anxiety.     nitroGLYCERIN 0.6 MG SL tablet  Commonly known as:  NITROSTAT  Place 0.6 mg under the tongue every 5 (five) minutes as needed for chest pain.     omeprazole 20 MG capsule  Commonly known as:  PRILOSEC  Take 20 mg by mouth 2 (two) times daily.     phenazopyridine 100 MG tablet  Commonly known as:  PYRIDIUM  Take 100 mg by mouth 3 (three) times daily with meals.     senna-docusate 8.6-50 MG per tablet  Commonly known as:  Senokot-S  Take 1 tablet by mouth daily.     tamsulosin 0.4 MG Caps capsule  Commonly known as:  FLOMAX  Take 0.4 mg by mouth daily after supper.     tobramycin 0.3 % ophthalmic solution  Commonly known as:  TOBREX  Place 1 drop into the left eye 4 (four) times daily. For 2 weeks following surgery. Starting 01/24/2014     traMADol 50 MG tablet  Commonly known as:  ULTRAM  Take 50 mg by mouth See admin instructions. 50 mg every AM and every 8 hours as needed for severe pain     zolpidem 5 MG tablet  Commonly known as:  AMBIEN  Take 2.5 mg by mouth at bedtime as needed for sleep.       Allergies  Allergen Reactions  . Ciprofloxacin Hcl Nausea And Vomiting  . Cocoa Other (See Comments)    Unknown; listed in MAR  . Codeine Other (See Comments)    Unknown; listed in MAR   . Hydrocodone Other (See Comments)    Unknown; listed in MAR  . Librax [Chlordiazepoxide-Clidinium] Other (See Comments)  Unknown; listed in MAR  . Loperamide Hcl Other (See Comments)    Unknown; listed in MAR  . Metaxalone Other (See Comments)    Unknown; listed in MAR  . Penicillins Other (See Comments)    Unknown; listed in MAR  . Sulfa Antibiotics Nausea And Vomiting and Rash  . Tetracyclines & Related Rash   Follow-up Information   Follow up with DASANAYAKA,GAYANI, MD. Schedule an appointment as soon as possible for a visit in 1 week.   Specialty:  Internal Medicine   Contact information:   [address] [place] [address] [phone]       Follow up with Outpatient monitor to be arranged through your Cardiologist.       The results of significant diagnostics from this hospitalization (including imaging, microbiology, ancillary and laboratory) are listed below for reference.    Significant Diagnostic Studies: Dg Chest 2 View  02/01/2014   CLINICAL DATA:  Near syncope  EXAM: CHEST  2 VIEW  COMPARISON:  10/19/2012  FINDINGS: Heart size is upper normal. Negative for heart failure. Left lower lobe airspace density similar to the prior study and most consistent with scarring. Negative for pleural effusion.  Severe compression fracture approximately T12 is unchanged.  IMPRESSION: Left lower lobe airspace disease is most consistent with scarring.   Electronically Signed   By: Marlan Palau M.D.   On: 02/01/2014 13:06   Ct Angio Chest Pe W/cm &/or Wo Cm  02/02/2014   CLINICAL DATA:  Elevated D-dimer, possible PE  EXAM: CT ANGIOGRAPHY CHEST WITH CONTRAST  TECHNIQUE: Multidetector CT imaging of the chest was performed using the standard protocol during bolus administration of intravenous contrast. Multiplanar CT image reconstructions and MIPs were obtained to evaluate the vascular anatomy.  CONTRAST:  OMNIPAQUE IOHEXOL 350 MG/ML SOLN  COMPARISON:  None  FINDINGS:  Sagittal images of the spine shows degenerative changes thoracic spine.  Cardiomegaly is noted. Atherosclerotic calcifications of thoracic aorta and coronary arteries. There is moderate size hiatal hernia. The patient is status postcholecystectomy.  No pulmonary embolus is noted. The study is of excellent technical quality. No mediastinal hematoma or adenopathy.  Images of the lung parenchyma shows no acute infiltrate or pulmonary edema. There is geographic mild parenchymal ground-glass attenuation probable due to hypoventilatory changes. No segmental infiltrate. No pneumothorax. Mild interstitial prominence bilateral lower lobe probable chronic in nature.  Review of the MIP images confirms the above findings.  IMPRESSION: 1. No pulmonary embolus is noted. 2. Atherosclerotic calcifications of thoracic aorta and coronary arteries. 3. No mediastinal hematoma or adenopathy. 4. No segmental infiltrate or pulmonary edema. Mild patchy ground-glass attenuation bilaterally probable due to hypoventilatory changes. Mild interstitial prominence bilaterally probable chronic in nature.   Electronically Signed   By: Natasha Mead M.D.   On: 02/02/2014 12:44    Microbiology: No results found for this or any previous visit (from the past 240 hour(s)).   Labs: Basic Metabolic Panel:  Recent Labs Lab 02/01/14 1235 02/02/14 0002 02/03/14 0410  NA 137 139 141  K 4.1 4.2 4.4  CL 106 103 103  CO2  --  26 26  GLUCOSE 124* 155* 87  BUN 22 27* 18  CREATININE 1.20 1.10 1.05  CALCIUM  --  8.9 9.4  MG  --   --  2.2   Liver Function Tests: No results found for this basename: AST, ALT, ALKPHOS, BILITOT, PROT, ALBUMIN,  in the last 168 hours No results found for this basename: LIPASE, AMYLASE,  in the last 168 hours No results found for this basename: AMMONIA,  in the last 168 hours CBC:  Recent Labs Lab 02/01/14 1226 02/01/14 1235 02/02/14 0002  WBC 8.9  --  6.4  HGB 13.1 13.3 11.8*  HCT 39.2 39.0 35.3*  MCV  100.0  --  100.0  PLT 186  --  176   Cardiac Enzymes:  Recent Labs Lab 02/01/14 1845 02/02/14 0002 02/02/14 0651  TROPONINI <0.30 <0.30 <0.30   BNP: BNP (last 3 results)  Recent Labs  02/01/14 1249  PROBNP 293.7   CBG: No results found for this basename: GLUCAP,  in the last 168 hours     Signed:  Laina Guerrieri K  [facility] 02/03/2014, 12:05 PM

## 2014-02-03 NOTE — Progress Notes (Signed)
Physical Therapy Treatment Patient Details Name: Andre Marsh Langland MRN: [id number] DOB: 10/04/1926 Today's Date: 02/03/2014    History of Present Illness Andre Marsh Delafuente is a 80 y.o. male with history of hypertension, anxiety/depression, mild dementia, paralysis agitans cataracts, who was in his ophthalmologist today and had a presyncopal episode. He states that he just felt very weak, nauseous and per staff he had some lightheadedness and was pale and diaphoretic.  Recent chest pain and pulse 40 on arrival of EMT's. PMHx:  Bipolar 1 disorder, HTN, anxiety, Parkinson's, Alzheimer's, depression, falls, GERD, cataracts B     PT Comments    *Good progress with mobility. Pt walked 200' without assistive device today, no LOB. He did report some dizziness in sitting and with walking. BP sitting 147/69, standing 127/66. *  Follow Up Recommendations  SNF;Supervision/Assistance - 24 hour     Equipment Recommendations  None recommended by PT    Recommendations for Other Services Other (comment) (PT to follow up in SNF)     Precautions / Restrictions Precautions Precautions: Fall Restrictions Weight Bearing Restrictions: No    Mobility  Bed Mobility Overal bed mobility: Modified Independent Bed Mobility: Supine to Sit     Supine to sit: Modified independent (Device/Increase time)        Transfers Overall transfer level: Modified independent Equipment used: None Transfers: Sit to/from Stand Sit to Stand: Modified independent (Device/Increase time)         General transfer comment: used rail/armrests  Ambulation/Gait Ambulation/Gait assistance: Supervision Ambulation Distance (Feet): 200 Feet Assistive device: None   Gait velocity: functional Gait velocity interpretation: at or above normal speed for age/gender General Gait Details: pt walked without RW today, no LOB, but reported mild dizziness in sitting and with walking. BP sitting 147/69, standing 127/66. RN  notified.   Stairs            Wheelchair Mobility    Modified Rankin (Stroke Patients Only)       Balance   Sitting-balance support: Feet supported Sitting balance-Leahy Scale: Good       Standing balance-Leahy Scale: Good                      Cognition Arousal/Alertness: Awake/alert Behavior During Therapy: WFL for tasks assessed/performed Overall Cognitive Status: History of cognitive impairments - at baseline       Memory: Decreased recall of precautions;Decreased short-term memory              Exercises      General Comments        Pertinent Vitals/Pain Pain Assessment: No/denies pain    Home Living                      Prior Function            PT Goals (current goals can now be found in the care plan section) Acute Rehab PT Goals Patient Stated Goal: none stated PT Goal Formulation: With patient Time For Goal Achievement: 02/09/14 Potential to Achieve Goals: Good Progress towards PT goals: Progressing toward goals    Frequency  Min 3X/week    PT Plan Current plan remains appropriate    Co-evaluation             End of Session Equipment Utilized During Treatment: Gait belt Activity Tolerance: Patient tolerated treatment well Patient left: in chair;with call bell/phone within reach     Time: 0958-1010 PT Time Calculation (min): 12 min  Charges:  $  Gait Training: 8-22 mins                    G Codes:      Tamala Ser 02/03/2014, 10:19 AM [phone]

## 2014-02-03 NOTE — Progress Notes (Signed)
80 y/o M with h/o reported h/o nonobstructive CAD, HTN, onset of DOE within the past 6-12 months, AAA, HL, mild dementia, paralysis agitans, and anxiety/depression who presented to [facility] 02/01/14 after having a presyncopal episode while in his ophthalmologist's office found to have intermittent chest pain. Per EMS HR in the 40s (rhythm unknown) on their arrival, BP 95/70 in the ED upon arrival. Intermittent chest pain. Negative troponin x 3. Hgb 13.1 EKG with sinus bradycardia, 57, RBBB, no st/t changes. His metoprolol was held upon admission   Subjective: No complaints except some DOE  Objective: Vital signs in last 24 hours: Temp:  [97.4 F (36.3 C)-98.1 F (36.7 C)] 98.1 F (36.7 C) (08/21 0440) Pulse Rate:  [58-66] 63 (08/21 0440) Resp:  [18] 18 (08/21 0440) BP: (130-155)/(52-62) 155/60 mmHg (08/21 0440) SpO2:  [92 %-95 %] 92 % (08/21 0440) Weight:  [132 lb 6.4 oz (60.056 kg)] 132 lb 6.4 oz (60.056 kg) (08/21 0440) Weight change: 2 lb 6.4 oz (1.089 kg) Last BM Date: 02/02/14 Intake/Output from previous day:+163 08/20 0701 - 08/21 0700 In: 563 [P.O.:560; I.V.:3] Out: 400 [Urine:400] Intake/Output this shift: Total I/O In: -  Out: 200 [Urine:200]  PE: General:Pleasant affect, NAD Skin:Warm and dry, brisk capillary refill HEENT:normocephalic, sclera clear, mucus membranes moist Neck:supple, no JVD Heart:S1S2 RRR with soft systolic murmur, no gallup, rub or click Lungs:clear without rales, rhonchi, or wheezes ONG:EXBM, non tender, + BS, do not palpate liver spleen or masses Ext:no lower ext edema, 2+ pedal pulses, 2+ radial pulses Neuro:alert and oriented X 3, MAE, follows commands, + facial symmetry  tele, HR in the 40s during the night but with waking HR in the 70s   Lab Results:  Recent Labs  02/01/14 1226 02/01/14 1235 02/02/14 0002  WBC 8.9  --  6.4  HGB 13.1 13.3 11.8*  HCT 39.2 39.0 35.3*  PLT 186  --  176   BMET  Recent Labs  02/02/14 0002  02/03/14 0410  NA 139 141  K 4.2 4.4  CL 103 103  CO2 26 26  GLUCOSE 155* 87  BUN 27* 18  CREATININE 1.10 1.05  CALCIUM 8.9 9.4    Recent Labs  02/02/14 0002 02/02/14 0651  TROPONINI <0.30 <0.30    Lab Results  Component Value Date   CHOL 191 02/03/2014   HDL 40 02/03/2014   LDLCALC 119* 02/03/2014   TRIG 160* 02/03/2014   CHOLHDL 4.8 02/03/2014   Lab Results  Component Value Date   HGBA1C 6.0* 12/17/2010     Lab Results  Component Value Date   TSH 2.100 02/01/2014    Hepatic Function Panel No results found for this basename: PROT, ALBUMIN, AST, ALT, ALKPHOS, BILITOT, BILIDIR, IBILI,  in the last 72 hours  Recent Labs  02/03/14 0410  CHOL 191   No results found for this basename: PROTIME,  in the last 72 hours     Studies/Results: 2D Echo: Study Conclusions - Left ventricle: The cavity size was normal. Wall thickness was increased in a pattern of mild LVH. Systolic function was normal. The estimated ejection fraction was in the range of 60% to 65%. Doppler parameters are consistent with abnormal left ventricular relaxation (grade 1 diastolic dysfunction). - Pericardium, extracardiac: A trivial pericardial effusion was identified.  Dg Chest 2 View  02/01/2014   CLINICAL DATA:  Near syncope  EXAM: CHEST  2 VIEW  COMPARISON:  10/19/2012  FINDINGS: Heart size is upper normal. Negative for  heart failure. Left lower lobe airspace density similar to the prior study and most consistent with scarring. Negative for pleural effusion.  Severe compression fracture approximately T12 is unchanged.  IMPRESSION: Left lower lobe airspace disease is most consistent with scarring.   Electronically Signed   By: Marlan Palauharles  Clark M.D.   On: 02/01/2014 13:06   Ct Angio Chest Pe W/cm &/or Wo Cm  02/02/2014   CLINICAL DATA:  Elevated D-dimer, possible PE  EXAM: CT ANGIOGRAPHY CHEST WITH CONTRAST  TECHNIQUE: Multidetector CT imaging of the chest was performed using the standard protocol  during bolus administration of intravenous contrast. Multiplanar CT image reconstructions and MIPs were obtained to evaluate the vascular anatomy.  CONTRAST:  100mL OMNIPAQUE IOHEXOL 350 MG/ML SOLN  COMPARISON:  None  FINDINGS: Sagittal images of the spine shows degenerative changes thoracic spine.  Cardiomegaly is noted. Atherosclerotic calcifications of thoracic aorta and coronary arteries. There is moderate size hiatal hernia. The patient is status postcholecystectomy.  No pulmonary embolus is noted. The study is of excellent technical quality. No mediastinal hematoma or adenopathy.  Images of the lung parenchyma shows no acute infiltrate or pulmonary edema. There is geographic mild parenchymal ground-glass attenuation probable due to hypoventilatory changes. No segmental infiltrate. No pneumothorax. Mild interstitial prominence bilateral lower lobe probable chronic in nature.  Review of the MIP images confirms the above findings.  IMPRESSION: 1. No pulmonary embolus is noted. 2. Atherosclerotic calcifications of thoracic aorta and coronary arteries. 3. No mediastinal hematoma or adenopathy. 4. No segmental infiltrate or pulmonary edema. Mild patchy ground-glass attenuation bilaterally probable due to hypoventilatory changes. Mild interstitial prominence bilaterally probable chronic in nature.   Electronically Signed   By: Natasha MeadLiviu  Pop M.D.   On: 02/02/2014 12:44    Medications: I have reviewed the patient's current medications. No current facility-administered medications on file prior to encounter.   Current Outpatient Prescriptions on File Prior to Encounter  Medication Sig Dispense Refill  . budesonide (ENTOCORT EC) 3 MG 24 hr capsule Take 9 mg by mouth every morning.      . docusate sodium (COLACE) 100 MG capsule Take 100 mg by mouth daily.      . folic acid (FOLVITE) 1 MG tablet Take 1 mg by mouth daily.      Marland Kitchen. galantamine (RAZADYNE ER) 8 MG 24 hr capsule Take 8 mg by mouth daily with breakfast.       . isosorbide mononitrate (IMDUR) 30 MG 24 hr tablet Take 30 mg by mouth daily.      Marland Kitchen. lamoTRIgine (LAMICTAL) 100 MG tablet Take 100 mg by mouth 2 (two) times daily.      Marland Kitchen. lisinopril (PRINIVIL,ZESTRIL) 20 MG tablet Take 20 mg by mouth daily.       Marland Kitchen. loratadine (CLARITIN) 10 MG tablet Take 10 mg by mouth daily.      Marland Kitchen. omeprazole (PRILOSEC) 20 MG capsule Take 20 mg by mouth 2 (two) times daily.      . phenazopyridine (PYRIDIUM) 100 MG tablet Take 100 mg by mouth 3 (three) times daily with meals.      . senna-docusate (SENOKOT-S) 8.6-50 MG per tablet Take 1 tablet by mouth daily.       . tamsulosin (FLOMAX) 0.4 MG CAPS capsule Take 0.4 mg by mouth daily after supper.         Assessment/Plan: 1. Presyncope/bradycardia/transient hypotension/RBBB -With reported HR of 40s at ophthalmology office (rhythm unknown)  -Metoprolol has been held since admission, HR low to mid 50s-  mid 60s, BP improved with gentle hydration, currently 130/52  -Echo- EF is nl 60-65% without WMA  negative serial troponin x 3 would not pursue further ischemic cardiac workup, iwhile he does have cardiac calcifications on CT scan-He had non obstructive CAD last year in [place].  Telemetry sinus brady w/ rates in the 50s  -outpatient monitor  -K+ 4.4, Mg 2.2  2. Intermittent chest pain x 2 years/DOE x 6 months  -Has been seeing his primary cardiologist, Dr Lisbeth Ply, in [facility] for this, reportedly cath 2014 at [facility] with nonobstructive disease  -ProBNP 293.7. CXR c/w scarring  -CTA chest: Negative for PE, atherosclerotic calcifications of thoracic aorta and coronary arteries.  -On aspirin, currently 325 mg at home would decrease to 81 mg daily unless needed otherwise   3. AAA  -Without rupture  -Follow up with PCP as outpatient   4. HTN  -Previously on metoprolol and lisinopril-held while inpatient, monitor BP upper limits 179/63 to 130/52  5. HL  -Not on statin,  Lipid Panel     Component Value Date/Time   CHOL 191  02/03/2014 0410   TRIG 160* 02/03/2014 0410   HDL 40 02/03/2014 0410   CHOLHDL 4.8 02/03/2014 0410   VLDL 32 02/03/2014 0410   LDLCALC 119* 02/03/2014 0410   Should add statin with CAD add lipitor   LOS: 2 days   Time spent with pt. :15 minutes. [facility] R  Nurse Practitioner Certified Pager [phone] or after 5pm and on weekends call [phone] 02/03/2014, 7:57 AM   I have examined the patient and reviewed assessment and plan and discussed with patient.  Agree with above as stated.  Patient still dizzy upon standing.  HR better without beta blocker.  COntinue to hold beta blocker.  Consider outpatient heart monitor with his new cardiollogist in [facility].  He is unsure of the doctor's name.   Annella Prowell S.

## 2014-02-08 ENCOUNTER — Encounter: Payer: Self-pay | Admitting: Internal Medicine

## 2014-02-08 ENCOUNTER — Non-Acute Institutional Stay (SKILLED_NURSING_FACILITY): Payer: Medicare Other | Admitting: Internal Medicine

## 2014-02-08 DIAGNOSIS — I10 Essential (primary) hypertension: Secondary | ICD-10-CM

## 2014-02-08 DIAGNOSIS — E7800 Pure hypercholesterolemia, unspecified: Secondary | ICD-10-CM

## 2014-02-08 DIAGNOSIS — F319 Bipolar disorder, unspecified: Secondary | ICD-10-CM

## 2014-02-08 DIAGNOSIS — I498 Other specified cardiac arrhythmias: Secondary | ICD-10-CM

## 2014-02-08 DIAGNOSIS — F0391 Unspecified dementia with behavioral disturbance: Secondary | ICD-10-CM

## 2014-02-08 DIAGNOSIS — I714 Abdominal aortic aneurysm, without rupture, unspecified: Secondary | ICD-10-CM

## 2014-02-08 DIAGNOSIS — I209 Angina pectoris, unspecified: Secondary | ICD-10-CM

## 2014-02-08 DIAGNOSIS — R001 Bradycardia, unspecified: Secondary | ICD-10-CM

## 2014-02-08 DIAGNOSIS — F03918 Unspecified dementia, unspecified severity, with other behavioral disturbance: Secondary | ICD-10-CM

## 2014-02-08 DIAGNOSIS — R079 Chest pain, unspecified: Secondary | ICD-10-CM

## 2014-02-08 NOTE — Assessment & Plan Note (Signed)
Holding metoprolol and lisinopril for now  -BP overall stable, rising  -Resumed lower dose of lisinopril (was on , resume at 

## 2014-02-08 NOTE — Assessment & Plan Note (Signed)
-  Serial trop neg  -2D echo done, unremarkable results  -D-dimer was mildly elevated with f/u CTA chest neg for PE  BNP on admit of 293.7 and chest x-ray in consistent with scarring  -2d echo results pending  - On minimal O2 support

## 2014-02-08 NOTE — Assessment & Plan Note (Signed)
he is followed by cardiology, outpatient  -denies abd pain  

## 2014-02-08 NOTE — Assessment & Plan Note (Signed)
Nodal blocking agents on hold for now  - Cardiac enzymes serially neg  - D-dimer was elevated with follow up CTA neg for PE  - 2-D echo done, and was unremarkable  - Cardiology was consulted, per below  - Recommendations for outpatient event monitor,

## 2014-02-08 NOTE — Assessment & Plan Note (Addendum)
Continue razadyne and ativan

## 2014-02-08 NOTE — Assessment & Plan Note (Signed)
invega, lomictal

## 2014-02-08 NOTE — Assessment & Plan Note (Signed)
On no meds LDL 119, HDL 40  Started on lipitor 20 mg

## 2014-02-08 NOTE — Progress Notes (Signed)
MRN: [id number] Name: TROYE HIEMSTRA  Sex: male Age: 80 y.o. DOB: 1927-02-08  [facility] #: [name] Facility/Room: 307W Level Of Care: SNF Provider: Merrilee Seashore D Emergency Contacts: Extended Emergency Contact Information Primary Emergency Contact: [name],[name] Address: [address]          [facility] [address] [place] Home Phone: [phone] Mobile Phone: [phone] Relation: Daughter  Code Status: FULL  Allergies: Ciprofloxacin hcl; Cocoa; Codeine; Hydrocodone; Librax; Loperamide hcl; Metaxalone; Penicillins; Sulfa antibiotics; and Tetracyclines & related  Chief Complaint  Patient presents with  . New Admit To SNF    HPI: Patient is 80 y.o. male who is admitted to SNF after hosp for CP with neg w/u except for brADYCARDIA though 2/2 possibly to bblocker.  Past Medical History  Diagnosis Date  . Bipolar 1 disorder   . Hypertension   . Anxiety   . Paralysis agitans   . Alzheimer disease   . Depression   . Fall   . GERD (gastroesophageal reflux disease)   . Cataract     bilateral  . AAA (abdominal aortic aneurysm)   . CAD (coronary artery disease)     a. patient reported nonobstructive CAD. b. patient reported cath 2014 @ [facility]  . Hyperlipidemia     Past Surgical History  Procedure Laterality Date  . Cataract extraction Left       Medication List       This list is accurate as of: 02/08/14  6:01 PM.  Always use your most recent med list.               acetaminophen 325 MG tablet  Commonly known as:  TYLENOL  Take 650 mg by mouth every 4 (four) hours as needed for mild pain.     aspirin 325 MG EC tablet  Take 325 mg by mouth daily.     atorvastatin 20 MG tablet  Commonly known as:  LIPITOR  Take 1 tablet (20 mg total) by mouth daily at 6 PM.     budesonide 3 MG 24 hr capsule  Commonly known as:  ENTOCORT EC  Take 9 mg by mouth every morning.     docusate sodium 100 MG capsule  Commonly known as:  COLACE  Take 100 mg by mouth  daily.     DUREZOL 0.05 % Emul  Generic drug:  Difluprednate  Place 1 drop into the left eye 2 (two) times daily. For 1 week, starting 8/19.     ENSURE  Take 237 mLs by mouth at bedtime. With snack     folic acid 1 MG tablet  Commonly known as:  FOLVITE  Take 1 mg by mouth daily.     galantamine 8 MG 24 hr capsule  Commonly known as:  RAZADYNE ER  Take 8 mg by mouth daily with breakfast.     ILEVRO 0.3 % Susp  Generic drug:  Nepafenac  Place 1 drop into the left eye at bedtime. For 4 weeks, starting 01/24/2014     INVEGA 3 MG 24 hr tablet  Generic drug:  paliperidone  Take 3 mg by mouth at bedtime. For paranoia and mood     isosorbide mononitrate 30 MG 24 hr tablet  Commonly known as:  IMDUR  Take 30 mg by mouth daily.     lamoTRIgine 100 MG tablet  Commonly known as:  LAMICTAL  Take 100 mg by mouth 2 (two) times daily.     lisinopril 10 MG tablet  Commonly known as:  PRINIVIL,ZESTRIL  Take 1 tablet (10 mg total) by mouth daily.     loratadine 10 MG tablet  Commonly known as:  CLARITIN  Take 10 mg by mouth daily.     LORazepam 0.5 MG tablet  Commonly known as:  ATIVAN  Take 0.5 mg by mouth every 12 (twelve) hours as needed for anxiety.     nitroGLYCERIN 0.6 MG SL tablet  Commonly known as:  NITROSTAT  Place 0.6 mg under the tongue every 5 (five) minutes as needed for chest pain.     omeprazole 20 MG capsule  Commonly known as:  PRILOSEC  Take 20 mg by mouth 2 (two) times daily.     phenazopyridine 100 MG tablet  Commonly known as:  PYRIDIUM  Take 100 mg by mouth 3 (three) times daily with meals.     senna-docusate 8.6-50 MG per tablet  Commonly known as:  Senokot-S  Take 1 tablet by mouth daily.     tamsulosin 0.4 MG Caps capsule  Commonly known as:  FLOMAX  Take 0.4 mg by mouth daily after supper.     tobramycin 0.3 % ophthalmic solution  Commonly known as:  TOBREX  Place 1 drop into the left eye 4 (four) times daily. For 2 weeks following surgery.  Starting 01/24/2014     traMADol 50 MG tablet  Commonly known as:  ULTRAM  Take 50 mg by mouth See admin instructions. 50 mg every AM and every 8 hours as needed for severe pain     zolpidem 5 MG tablet  Commonly known as:  AMBIEN  Take 2.5 mg by mouth at bedtime as needed for sleep.        No orders of the defined types were placed in this encounter.    Immunization History  Administered Date(s) Administered  . Influenza Whole 03/21/2013  . Pneumococcal Polysaccharide-23 02/03/2014    History  Substance Use Topics  . Smoking status: Never Smoker   . Smokeless tobacco: Not on file  . Alcohol Use: Yes     Comment: wine occasionally    Family history is noncontributory    Review of Systems  DATA OBTAINED: from patient; no c/o GENERAL: Feels well no fevers, fatigue, appetite changes SKIN: No itching, rash or wounds EYES: No eye pain, redness, discharge EARS: No earache, tinnitus, change in hearing NOSE: No congestion, drainage or bleeding  MOUTH/THROAT: No mouth or tooth pain, No sore throat RESPIRATORY: No cough, wheezing, SOB CARDIAC: No chest pain, palpitations, lower extremity edema  GI: No abdominal pain, No N/V/D or constipation, No heartburn or reflux  GU: No dysuria, frequency or urgency, or incontinence  MUSCULOSKELETAL: No unrelieved bone/joint pain NEUROLOGIC: No headache, dizziness or focal weakness PSYCHIATRIC: No overt anxiety or sadness. Sleeps well. No behavior issue.   Filed Vitals:   02/08/14 1126  BP: 138/78  Pulse: 70  Temp: 98.6 F (37 C)  Resp: 18    Physical Exam  GENERAL APPEARANCE: Alert, conversant. Appropriately groomed. No acute distress.  SKIN: No diaphoresis rash HEAD: Normocephalic, atraumatic  EYES: Conjunctiva/lids clear. Pupils round, reactive. EOMs intact.  EARS: External exam WNL, canals clear. Hearing grossly normal.  NOSE: No deformity or discharge.  MOUTH/THROAT: Lips w/o lesions RESPIRATORY: Breathing is even,  unlabored. Lung sounds are clear   CARDIOVASCULAR: Heart RRR no murmurs, rubs or gallops. No peripheral edema.  GASTROINTESTINAL: Abdomen is soft, non-tender, not distended w/ normal bowel sounds GENITOURINARY: Bladder non tender, not distended  MUSCULOSKELETAL:  No abnormal joints or musculature NEUROLOGIC:  Cranial nerves 2-12 grossly intact. Moves all extremities no tremor. PSYCHIATRIC: Mood and affect appropriate to situation, no behavioral issues  Patient Active Problem List   Diagnosis Date Noted  . Chest pain 02/08/2014  . Bradycardia 02/03/2014  . Near syncope 02/01/2014  . Hypotension 02/01/2014  . Dementia with behavioral disturbance 01/24/2014  . AAA (abdominal aortic aneurysm) without rupture 10/19/2013  . Pneumonia 09/28/2013  . Bipolar disorder, unspecified 02/04/2013  . GERD (gastroesophageal reflux disease) 02/04/2013  . Hyperglycemia 12/13/2012  . Unspecified constipation 10/22/2012  . Pure hypercholesterolemia 10/22/2012  . Essential hypertension, benign 10/22/2012  . Allergic rhinitis, cause unspecified 10/22/2012    CBC    Component Value Date/Time   WBC 6.4 02/02/2014 0002   RBC 3.53* 02/02/2014 0002   HGB 11.8* 02/02/2014 0002   HCT 35.3* 02/02/2014 0002   PLT 176 02/02/2014 0002   MCV 100.0 02/02/2014 0002   LYMPHSABS 2.5 12/17/2010 0445   MONOABS 0.4 12/17/2010 0445   EOSABS 0.4 12/17/2010 0445   BASOSABS 0.0 12/17/2010 0445    CMP     Component Value Date/Time   NA 141 02/03/2014 0410   K 4.4 02/03/2014 0410   CL 103 02/03/2014 0410   CO2 26 02/03/2014 0410   GLUCOSE 87 02/03/2014 0410   BUN 18 02/03/2014 0410   CREATININE 1.05 02/03/2014 0410   CALCIUM 9.4 02/03/2014 0410   PROT 6.6 12/16/2010 1220   ALBUMIN 3.6 12/16/2010 1220   AST 16 12/16/2010 1220   ALT 15 12/16/2010 1220   ALKPHOS 77 12/16/2010 1220   BILITOT 0.3 12/16/2010 1220   GFRNONAA 63* 02/03/2014 0410   GFRAA 73* 02/03/2014 0410    Assessment and Plan  Bradycardia Nodal blocking agents on hold for  now  - Cardiac enzymes serially neg  - D-dimer was elevated with follow up CTA neg for PE  - 2-D echo done, and was unremarkable  - Cardiology was consulted, per below  - Recommendations for outpatient event monitor,   Chest pain -Serial trop neg  -2D echo done, unremarkable results  -D-dimer was mildly elevated with f/u CTA chest neg for PE  BNP on admit of 293.7 and chest x-ray in consistent with scarring  -2d echo results pending  - On minimal O2 support    Essential hypertension, benign Holding metoprolol and lisinopril for now  -BP overall stable, rising  -Resumed lower dose of lisinopril (was on , resume at    AAA (abdominal aortic aneurysm) without rupture he is followed by cardiology, outpatient  -denies abd pain   Dementia with behavioral disturbance Continue razadyne and ativan  Bipolar disorder, unspecified  invega, lomictal  Pure hypercholesterolemia On no meds LDL 119, HDL 40  Started on lipitor 20 mg    Margit Hanks, MD

## 2014-03-02 NOTE — Progress Notes (Signed)
Late entry for missed G-code. Based on chart review of evaluation and goals of Samul Dada, PT.   02-25-14 0900  PT G-Codes **NOT FOR INPATIENT CLASS**  Functional Assessment Tool Used Clinical judgement based on review of records  Functional Limitation Mobility: Walking and moving around  Mobility: Walking and Moving Around Current Status [phone]) CI  Mobility: Walking and Moving Around Goal Status [phone]) CI  Lavona [place], [place]  [phone] 03/02/2014

## 2014-03-17 ENCOUNTER — Non-Acute Institutional Stay (SKILLED_NURSING_FACILITY): Payer: Medicare Other | Admitting: Internal Medicine

## 2014-03-17 DIAGNOSIS — F03918 Unspecified dementia, unspecified severity, with other behavioral disturbance: Secondary | ICD-10-CM

## 2014-03-17 DIAGNOSIS — F0391 Unspecified dementia with behavioral disturbance: Secondary | ICD-10-CM

## 2014-03-17 DIAGNOSIS — E7800 Pure hypercholesterolemia, unspecified: Secondary | ICD-10-CM

## 2014-03-17 DIAGNOSIS — R001 Bradycardia, unspecified: Secondary | ICD-10-CM

## 2014-03-17 DIAGNOSIS — R51 Headache: Secondary | ICD-10-CM

## 2014-03-17 DIAGNOSIS — I10 Essential (primary) hypertension: Secondary | ICD-10-CM

## 2014-03-17 DIAGNOSIS — I714 Abdominal aortic aneurysm, without rupture, unspecified: Secondary | ICD-10-CM

## 2014-03-17 DIAGNOSIS — R519 Headache, unspecified: Secondary | ICD-10-CM

## 2014-03-17 DIAGNOSIS — F3110 Bipolar disorder, current episode manic without psychotic features, unspecified: Secondary | ICD-10-CM

## 2014-03-17 DIAGNOSIS — K219 Gastro-esophageal reflux disease without esophagitis: Secondary | ICD-10-CM

## 2014-03-17 NOTE — Progress Notes (Signed)
Patient ID: Andre Marsh, male   DOB: Mar 13, 1927, 80 y.o.   MRN: [id number]   this is a routi visit.  Level of care skilled.  Facility AF.   Chief Complaint   Echo management of chronic medical conditions including dementia-hypertension-3 cardiac-bipolar disorder-anxiety-GERD   History of present illness.  Patient is a pleasant 80 year old male with appears to be relatively stable-although he remains somewhat anxious.  He feels at times he having headaches--and would like a CT scan of his head-he says  says he feels somewhat jittery at times-would feel better if the CT scan of his head was done at some point --   He's also followed by ophthalmology he is status post cataract surgery and receiving topical eyedrops currently.  He was also seen recently by cardiology decreased his lisinopril dose and discontinued his Metroprolol-he does have some history of bradycardia as well as low blood pressures at times.  .  Patient was also seen by psychiatric services for a significant history of dementia as well as bipolar disorder and anxiety this appears to be relatively stable although he does appear anxious at times-he continues on Ativan when necessary-he is also on Lamictal twice a day as well as galantamine and Invega QHS  Patient does not complaini of any chest pain or shortness of breath-he was hospitalized  proximally 2 months ago for chest pain-no acute etiology was found he was found to be bradycardic and adjustments were made to his beta blocker and he is followed by cardiology-again cardiology  has discontinued his Metroprolol and decreased his lisinopril-- variable blood pressures from the 90s to 150s systolically-occasionally does have a heart rate in the 50s I also see some in the 70s -he does not appear to be overtly symptomatic     .  Past Medical History   Diagnosis  Date   .  Bipolar 1 disorder    .  Hypertension    .  Anxiety    .  Paralysis agitans    .  Alzheimer  disease    .  Depression    .  Fall    .  GERD (gastroesophageal reflux disease)    .  Cataract      bilateral   .  AAA (abdominal aortic aneurysm)    .  CAD (coronary artery disease)      a. patient reported nonobstructive CAD. b. patient reported cath 2014 @ [facility]   .  Hyperlipidemia     Past Surgical History   Procedure  Laterality  Date   .  Cataract extraction  Left       Medication List                     acetaminophen 325 MG tablet    Commonly known as: TYLENOL    Take 650 mg by mouth every 4 (four) hours as needed for mild pain.    aspirin 325 MG EC tablet    Take 325 mg by mouth daily.    atorvastatin 20 MG tablet    Commonly known as: LIPITOR    Take 1 tablet (20 mg total) by mouth daily at 6 PM.    budesonide 3 MG 24 hr capsule    Commonly known as: ENTOCORT EC    Take 9 mg by mouth every morning.    docusate sodium 100 MG capsule    Commonly known as: COLACE    Take 100 mg by mouth daily.  DUREZOL 0.05 % Emul    Generic drug: Difluprednate    Place 1 drop into the left eye 2 (two) times daily. For 1 week, starting 8/19.    ENSURE    Take 237 mLs by mouth at bedtime. With snack    folic acid 1 MG tablet    Commonly known as: FOLVITE    Take 1 mg by mouth daily.    galantamine 8 MG 24 hr capsule    Commonly known as: RAZADYNE ER    Take 8 mg by mouth daily with breakfast.    ILEVRO 0.3 % Susp    Generic drug: Nepafenac    Place 1 drop into the left eye at bedtime. For 4 weeks, starting 01/24/2014    INVEGA 3 MG 24 hr tablet    Generic drug: paliperidone    Take 3 mg by mouth at bedtime. For paranoia and mood    isosorbide mononitrate 30 MG 24 hr tablet    Commonly known as: IMDUR    Take 30 mg by mouth daily.    lamoTRIgine 100 MG tablet    Commonly known as: LAMICTAL    Take 100 mg by mouth 2 (two) times daily.    lisinopril 10 MG tablet    Commonly known as: PRINIVIL,ZESTRIL    Take 1 tablet (10 mg total) by mouth daily.    loratadine 10 MG  tablet    Commonly known as: CLARITIN    Take 10 mg by mouth daily.    LORazepam 0.5 MG tablet    Commonly known as: ATIVAN    Take 0.5 mg by mouth every 12 (twelve) hours as needed for anxiety.    nitroGLYCERIN 0.6 MG SL tablet    Commonly known as: NITROSTAT    Place 0.6 mg under the tongue every 5 (five) minutes as needed for chest pain.    omeprazole 20 MG capsule    Commonly known as: PRILOSEC    Take 20 mg by mouth 2 (two) times daily.    phenazopyridine 100 MG tablet    Commonly known as: PYRIDIUM    Take 100 mg by mouth 3 (three) times daily with meals.    senna-docusate 8.6-50 MG per tablet    Commonly known as: Senokot-S    Take 1 tablet by mouth daily.    tamsulosin 0.4 MG Caps capsule    Commonly known as: FLOMAX    Take 0.4 mg by mouth daily after supper.    tobramycin 0.3 % ophthalmic solution    Commonly known as: TOBREX    Place 1 drop into the left eye 4 (four) times daily. For 2 weeks following surgery. Starting 01/24/2014    traMADol 50 MG tablet    Commonly known as: ULTRAM    Take 50 mg by mouth See admin instructions. 50 mg every AM and every 8 hours as needed for severe pain    zolpidem 5 MG tablet    Commonly known as: AMBIEN    Take 2.5 mg by mouth at bedtime as needed for sleep.  Lisinopril to 10 mg daily     No orders of the defined types were placed in this encounter.  Immunization History   Administered  Date(s) Administered   .  Influenza Whole  03/21/2013   .  Pneumococcal Polysaccharide-23  02/03/2014    History   Substance Use Topics   .  Smoking status:  Never Smoker   .  Smokeless tobacco:  Not on  file   .  Alcohol Use:  Yes      Comment: wine occasionally   Family history is noncontributory  Review of Systems  DATA OBTAINED: from patient--intermittent headaches-rather vague about the exact location of these headaches poinst sometimes to side of his head-says these are intermittent  GENERAL: Feels well no fevers, fatigue, appetite  changes  SKIN: No itching, rash or wounds  EYES: No eye pain, redness, discharge  EARS: No earache, tinnitus, change in hearing  NOSE: No congestion, drainage or bleeding  MOUTH/THROAT: No mouth or tooth pain, No sore throat  RESPIRATORY: No cough, wheezing, SOB  CARDIAC: No chest pain, palpitations, lower extremity edema  GI: No abdominal pain, No N/V/D or constipation, No heartburn or reflux-- history of GErD  GU: No dysuria, frequency or urgency, or incontinence  MUSCULOSKELETAL: No unrelieved bone/joint pain  NEUROLOGIC: occasional headache,  no dizziness or focal weakness  PSYCHIATRIC: No overt anxiety or sadness. Sleeps well. No behavior issue--3 of dementia bipolar disorder has anxiety at times but appears to be relatively well-controlled.                    Physical Exam   Temperature 97.2 pulse 60 respirations 18 blood pressure variable 90/41-151/71 in this range-pulses appear around 50s to 70s recently GENERAL APPEARANCE: Alert, conversant. Appropriately groomed. No acute distress.  SKIN: No diaphoresis rash  HEAD: Normocephalic, atraumatic  EYES: Conjunctiva/lids clear. Pupils round, reactive. EOMs intact.  EARS: External exam WNL, canals clear. Hearing grossly normal.  NOSE: No deformity or discharge.  MOUTH/THROAT: Lips w/o lesions  RESPIRATORY: Breathing is even, unlabored. Lung sounds are clear  CARDIOVASCULAR: Heart RRR no murmurs, rubs or gallops. No peripheral edema.  GASTROINTESTINAL: Abdomen is soft, non-tender, not distended w/ normal bowel sounds   MUSCULOSKELETAL: No abnormal joints or musculature does ambulate about the facility  NEUROLOGIC: Cranial nerves 2-12 grossly intact. Moves all extremities no tremor.--I did not appreciate any lateralizing findings  PSYCHIATRIC: Mood and affect appropriate to situation, no behavioral issues  Has anxiety at times Patient Active Problem List    Diagnosis  Date Noted   .  Chest pain  02/08/2014   .  Bradycardia   02/03/2014   .  Near syncope  02/01/2014   .  Hypotension  02/01/2014   .  Dementia with behavioral disturbance  01/24/2014   .  AAA (abdominal aortic aneurysm) without rupture  10/19/2013   .  Pneumonia  09/28/2013   .  Bipolar disorder, unspecified  02/04/2013   .  GERD (gastroesophageal reflux disease)  02/04/2013   .  Hyperglycemia  12/13/2012   .  Unspecified constipation  10/22/2012   .  Pure hypercholesterolemia  10/22/2012   .  Essential hypertension, benign  10/22/2012   .  Allergic rhinitis, cause unspecified  10/22/2012   CBC    Component  Value  Date/Time    WBC  6.4  02/02/2014 0002    RBC  3.53*  02/02/2014 0002    HGB  11.8*  02/02/2014 0002    HCT  35.3*  02/02/2014 0002    PLT  176  02/02/2014 0002    MCV  100.0  02/02/2014 0002    LYMPHSABS  2.5  12/17/2010 0445    MONOABS  0.4  12/17/2010 0445    EOSABS  0.4  12/17/2010 0445    BASOSABS  0.0  12/17/2010 0445   CMP    Component  Value  Date/Time  NA  141  02/03/2014 0410    K  4.4  02/03/2014 0410    CL  103  02/03/2014 0410    CO2  26  02/03/2014 0410    GLUCOSE  87  02/03/2014 0410    BUN  18  02/03/2014 0410    CREATININE  1.05  02/03/2014 0410    CALCIUM  9.4  02/03/2014 0410    PROT  6.6  12/16/2010 1220    ALBUMIN  3.6  12/16/2010 1220    AST  16  12/16/2010 1220    ALT  15  12/16/2010 1220    ALKPHOS  77  12/16/2010 1220    BILITOT  0.3  12/16/2010 1220    GFRNONAA  63*  02/03/2014 0410    GFRAA  73*  02/03/2014 0410   Assessment and Plan  Bradycardia  Nodal blocking agents  continue per cardiology 3  During recent hospitalization....  - Cardiac enzymes serially neg  - D-dimer was elevated with follow up CTA neg for PE  - 2-D echo done, and was unremarkable  - Cardiology was consulted and per chart review they are following this  -,  Chest pain  -Serial trop neg  -2D echo done, unremarkable results  -D-dimer was mildly elevated with f/u CTA chest neg for PE  BNP on admit  To hospital of 293.7 and chest x-ray in  consistent with scarring   Essential hypertension, benign  Metroprolol been discontinued and lisinopril dose reduced  -This point monitor-did not show symptoms of hypertension  AAA (abdominal aortic aneurysm) without rupture  he is followed by cardiology, outpatient -- . Recent ultrasound showed 3.25-- 3.4 cm- -denies abd pain  Dementia with behavioral disturbance  Continue razadyne and ativan--this appears relatively state  Bipolar disorder, unspecified  invega, lomictal --this appears stable as well Pure hypercholesterolemiRecent  LDL19, HDL 40 Started on lipitor 20 mg--we'll update liver function tests.  Headache-at this point appears to be intermittent really difficult to localize where this headache is occurring per patient-does not complaining of associated symptoms like dizziness or visual changes-we'll order a CBC CMP TSH-also per fairly extensive discussion with patient we'll order a CT scan--- clinically he appears stable.    Allergic rhinitis-continues on Claritin this appears to be stable currently.  Her-he continues on Prilosec twice a day this appears to be stable as well. Eye  issues-he is followed by ophthalmology he continues on numerous topical drops-this appears to be relatively stable.   [id number] note greater than 35 minutes spent assessing patient-discussing his numerous concerns-reviewing his chart-and coordinating and formulating a plan of care for numerous diagnoses-of note greater than 50% of time spent coordinating plan of care with patient's input

## 2014-03-25 ENCOUNTER — Encounter: Payer: Self-pay | Admitting: Internal Medicine

## 2014-04-07 ENCOUNTER — Other Ambulatory Visit: Payer: Self-pay

## 2014-04-07 MED ORDER — TRAMADOL HCL 50 MG PO TABS: ORAL_TABLET | ORAL | Status: DC

## 2014-04-07 NOTE — Telephone Encounter (Signed)
RX sent to [facility] [place] [facility] @ [phone]. Phone number [phone]  

## 2014-04-18 ENCOUNTER — Other Ambulatory Visit: Payer: Self-pay | Admitting: *Deleted

## 2014-04-18 MED ORDER — LORAZEPAM 0.5 MG PO TABS: 0.5000 mg | ORAL_TABLET | Freq: Two times a day (BID) | ORAL | Status: DC | PRN

## 2014-04-18 NOTE — Telephone Encounter (Signed)
[facility] [place] 

## 2014-05-02 ENCOUNTER — Encounter: Payer: Self-pay | Admitting: Internal Medicine

## 2014-05-02 ENCOUNTER — Non-Acute Institutional Stay (SKILLED_NURSING_FACILITY): Payer: Medicare Other | Admitting: Internal Medicine

## 2014-05-02 DIAGNOSIS — I10 Essential (primary) hypertension: Secondary | ICD-10-CM

## 2014-05-02 DIAGNOSIS — I714 Abdominal aortic aneurysm, without rupture, unspecified: Secondary | ICD-10-CM

## 2014-05-02 DIAGNOSIS — F3170 Bipolar disorder, currently in remission, most recent episode unspecified: Secondary | ICD-10-CM

## 2014-05-02 DIAGNOSIS — F0391 Unspecified dementia with behavioral disturbance: Secondary | ICD-10-CM

## 2014-05-02 DIAGNOSIS — F03918 Unspecified dementia, unspecified severity, with other behavioral disturbance: Secondary | ICD-10-CM

## 2014-05-02 DIAGNOSIS — K219 Gastro-esophageal reflux disease without esophagitis: Secondary | ICD-10-CM

## 2014-05-02 NOTE — Progress Notes (Signed)
Patient ID: Andre CastillaHenry P Marsh, male   DOB: 08/01/1926, 80 y.o.   MRN: [id number]  this is a routine visit.  Level care skilled.  Facility [facility].     Chief complaint- management of chronic medical conditions including dementia-hypertension-3 cardiac-bipolar disorder-anxiety-GERD   History of present illness.  Patient is a pleasant 80 year old male with appears to be relatively stable-although he remains somewhat anxious  When I saw him last month he did complain of intermittent headache --a CT scan was considered--nonetheless he does not really complain of headache today he has been at his baseline-neurologically does not have any complaints and apparently has not had for some time.    t --   He's also followed by ophthalmology he is status post cataract surgery and receiving topical eyedrops currently.  He was also seen recently by cardiology decreased his lisinopril dose and discontinued his Metroprolol-he does have some history of bradycardia as well as low blood pressures at times--the lowest I see recently is 95/54 I did take it manually today and got 138/72-other recent blood pressures 130/71-105/57.  Marland Kitchen.  Patient was also seen by psychiatric services for a significant history of dementia as well as bipolar disorder and anxiety this appears to be relatively stable although he does appear anxious at times-he continues on Ativan when necessary-he is also on Lamictal twice a day as well as galantamine and Invega QHS  Patient does not complaini of any chest pain or shortness of breath-he was hospitalized proximally 3 months ago for chest pain-no acute etiology was found he was found to be bradycardic and adjustments were made to his beta blocker and he is followed by cardiology-again cardiology has discontinued his Metroprolol and decreased his lisinopril-      .  Past Medical History   Diagnosis  Date   .  Bipolar 1 disorder    .  Hypertension    .  Anxiety      .  Paralysis agitans    .  Alzheimer disease    .  Depression    .  Fall    .  GERD (gastroesophageal reflux disease)    .  Cataract      bilateral   .  AAA (abdominal aortic aneurysm)    .  CAD (coronary artery disease)      a. patient reported nonobstructive CAD. b. patient reported cath 2014 @ [facility]   .  Hyperlipidemia     Past Surgical History   Procedure  Laterality  Date   .  Cataract extraction  Left       Medication List                    acetaminophen 325 MG tablet    Commonly known as: TYLENOL    Take 650 mg by mouth every 4 (four) hours as needed for mild pain.    aspirin 325 MG EC tablet    Take 325 mg by mouth daily.    atorvastatin 20 MG tablet    Commonly known as: LIPITOR    Take 1 tablet (20 mg total) by mouth daily at 6 PM.    budesonide 3 MG 24 hr capsule    Commonly known as: ENTOCORT EC    Take 9 mg by mouth every morning.    docusate sodium 100 MG capsule    Commonly known as: COLACE    Take 100 mg by mouth daily.    DUREZOL 0.05 % Emul  Generic drug: Difluprednate    Place 1 drop into the left eye 2 (two) times daily. For 1 week, starting 8/19.    ENSURE    Take 237 mLs by mouth at bedtime. With snack    folic acid 1 MG tablet    Commonly known as: FOLVITE    Take 1 mg by mouth daily.    galantamine 8 MG 24 hr capsule    Commonly known as: RAZADYNE ER    Take 8 mg by mouth daily with breakfast.    ILEVRO 0.3 % Susp    Generic drug: Nepafenac    Place 1 drop into the left eye at bedtime. For 4 weeks, starting 01/24/2014    INVEGA 3 MG 24 hr tablet    Generic drug: paliperidone    Take 3 mg by mouth at bedtime. For paranoia and mood    isosorbide mononitrate 30 MG 24 hr tablet    Commonly known as: IMDUR    Take 30 mg by mouth daily.    lamoTRIgine 100 MG tablet    Commonly known as: LAMICTAL    Take 100 mg by mouth 2 (two) times  daily.    lisinopril 10 MG tablet    Commonly known as: PRINIVIL,ZESTRIL    Take 1 tablet (10 mg total) by mouth daily.    loratadine 10 MG tablet    Commonly known as: CLARITIN    Take 10 mg by mouth daily.    LORazepam 0.5 MG tablet    Commonly known as: ATIVAN    Take 0.5 mg by mouth every 12 (twelve) hours as needed for anxiety.    nitroGLYCERIN 0.6 MG SL tablet    Commonly known as: NITROSTAT    Place 0.6 mg under the tongue every 5 (five) minutes as needed for chest pain.    omeprazole 20 MG capsule    Commonly known as: PRILOSEC    Take 20 mg by mouth 2 (two) times daily.    phenazopyridine 100 MG tablet    Commonly known as: PYRIDIUM    Take 100 mg by mouth 3 (three) times daily with meals.    senna-docusate 8.6-50 MG per tablet    Commonly known as: Senokot-S    Take 1 tablet by mouth daily.    tamsulosin 0.4 MG Caps capsule    Commonly known as: FLOMAX    Take 0.4 mg by mouth daily after supper.    tobramycin 0.3 % ophthalmic solution    Commonly known as: TOBREX    Place 1 drop into the left eye 4 (four) times daily. For 2 weeks following surgery. Starting 01/24/2014    traMADol 50 MG tablet    Commonly known as: ULTRAM    Take 50 mg by mouth See admin instructions. 50 mg every AM and every 8 hours as needed for severe pain    zolpidem 5 MG tablet    Commonly known as: AMBIEN    Take 2.5 mg by mouth at bedtime as needed for sleep.  Lisinopril to 10 mg daily     No orders of the defined types were placed in this encounter.  Immunization History   Administered  Date(s) Administered   .  Influenza Whole  03/21/2013   .  Pneumococcal Polysaccharide-23  02/03/2014    History   Substance Use Topics   .  Smoking status:  Never Smoker   .  Smokeless tobacco:  Not on file   .  Alcohol Use:  Yes      Comment: wine occasionally   Family history is noncontributory  Review of Systems  DATA  OBTAINED: from patient-nursing staff GENERAL: Feels well no fevers, fatigue, appetite changes  SKIN: No itching, rash or wounds  EYES: No eye pain, redness, discharge  EARS: No earache, tinnitus, change in hearing  NOSE: No congestion, drainage or bleeding  MOUTH/THROAT: No mouth or tooth pain, No sore throat  RESPIRATORY: No cough, wheezing, SOB  CARDIAC: No chest pain, palpitations, lower extremity edema  GI: No abdominal pain, No N/V/D or constipation, No heartburn or reflux-- history of GErD  GU: No dysuria, frequency or urgency, or incontinence  MUSCULOSKELETAL: No unrelieved bone/joint pain  NEUROLOGIC:nol headache, no dizziness or focal weakness  PSYCHIATRIC: No overt anxiety or sadness. Sleeps well. No behavior issue--3 of dementia bipolar disorder has anxiety at times but appears to be relatively well-controlled.--says he feels a little depressed today feels it might be the season-denies any intention to harm himself                    Physical Exam   Temperature 97.3 pulse 70 respirations 20 blood pressure taken manually 138/72 GENERAL APPEARANCE: Alert, conversant. Appropriately groomed. No acute distress.  SKIN: No diaphoresis rash  HEAD: Normocephalic, atraumatic  EYES: Conjunctiva/lids clear. Pupils round, reactive. EOMs intact.  EARS: External exam WNL, canals clear. Hearing grossly normal.  NOSE: No deformity or discharge.  MOUTH/THROAT: Lips w/o lesions  RESPIRATORY: Breathing is even, unlabored. Lung sounds are clear  CARDIOVASCULAR: Heart RRR no murmurs, rubs or gallops. No peripheral edema.  GASTROINTESTINAL: Abdomen is soft, non-tender, not distended w/ normal bowel sounds   MUSCULOSKELETAL: No abnormal joints or musculature does ambulate about the facility in his walker NEUROLOGIC: Cranial nerves 2-12 grossly intact. Moves all extremities no tremor.--I did not appreciate any lateralizing findings  PSYCHIATRIC: Mood and  affect appropriate to situation, no behavioral issues Has anxiety at times Patient Active Problem List    Diagnosis  Date Noted   .  Chest pain  02/08/2014   .  Bradycardia  02/03/2014   .  Near syncope  02/01/2014   .  Hypotension  02/01/2014   .  Dementia with behavioral disturbance  01/24/2014   .  AAA (abdominal aortic aneurysm) without rupture  10/19/2013   .  Pneumonia  09/28/2013   .  Bipolar disorder, unspecified  02/04/2013   .  GERD (gastroesophageal reflux disease)  02/04/2013   .  Hyperglycemia  12/13/2012   .  Unspecified constipation  10/22/2012   .  Pure hypercholesterolemia  10/22/2012   .  Essential hypertension, benign  10/22/2012   .  Allergic rhinitis, cause unspecified  10/22/2012     03/20/2014.  WBC 6.7 hemoglobin 12.2 platelets 194 . Sodium 139 potassium 4.4 BUN 20 creatinine 1.0  .TSH3.33    Component  Value  Date/Time    WBC  6.4  02/02/2014 0002    RBC  3.53*  02/02/2014 0002    HGB  11.8*  02/02/2014 0002    HCT  35.3*  02/02/2014 0002    PLT  176  02/02/2014 0002    MCV  100.0  02/02/2014 0002    LYMPHSABS  2.5  12/17/2010 0445    MONOABS  0.4  12/17/2010 0445    EOSABS  0.4  12/17/2010 0445    BASOSABS  0.0  12/17/2010 0445   CMP    Component  Value  Date/Time  NA  141  02/03/2014 0410    K  4.4  02/03/2014 0410    CL  103  02/03/2014 0410    CO2  26  02/03/2014 0410    GLUCOSE  87  02/03/2014 0410    BUN  18  02/03/2014 0410    CREATININE  1.05  02/03/2014 0410    CALCIUM  9.4  02/03/2014 0410    PROT  6.6  12/16/2010 1220    ALBUMIN  3.6  12/16/2010 1220    AST  16  12/16/2010 1220    ALT  15  12/16/2010 1220    ALKPHOS  77  12/16/2010 1220    BILITOT  0.3  12/16/2010 1220    GFRNONAA  63*  02/03/2014 0410    GFRAA  73*  02/03/2014 0410   Assessment and Plan  Bradycardia  Nodal blocking agents  continue per cardiology 3  During recent hospitalization....  - Cardiac enzymes serially neg  - D-dimer was elevated with follow up CTA neg for PE  - 2-D echo done, and was unremarkable  Appears pulses run largely in the 60-70s lately  -,  Chest pain  -Serial trop neg  -2D echo done, unremarkable results  -D-dimer was mildly elevated with f/u CTA chest neg for PE  BNP on admit To hospital [facility] and chest x-ray  consistent with scarring  Essential hypertension, benign  Metroprolol been discontinued and lisinopril dose reduced  -This point monitor-peers to be relatively stable as noted above  AAA (abdominal aortic aneurysm) without rupture  he is followed by cardiology, outpatient -- . Recent ultrasound showed 3.25-- 3.4 cm- -denies abd pain  Dementia with behavioral disturbance  Continue razadyne and ativan--this appears relatively stable--He is followed by psychiatric services will write an order for a consult secondary to depression  Bipolar disorder, unspecified  invega, lomictal --this appears stable as well Pure hypercholesterolemiRecent LD--on Lipitor will update a lipid pane  -.   e.    Allergic rhinitis-continues on Claritin this appears to be stable currently.  Gerd--he continues on Prilosec twice a day this appears to be stable as well.  Eye issues-he is followed by ophthalmology he continues on numerous topical drops-this appears to be relatively stable.  [id number]

## 2014-06-29 ENCOUNTER — Non-Acute Institutional Stay (SKILLED_NURSING_FACILITY): Payer: Medicare Other | Admitting: Internal Medicine

## 2014-06-29 ENCOUNTER — Encounter: Payer: Self-pay | Admitting: Internal Medicine

## 2014-06-29 DIAGNOSIS — J3089 Other allergic rhinitis: Secondary | ICD-10-CM

## 2014-06-29 DIAGNOSIS — F0391 Unspecified dementia with behavioral disturbance: Secondary | ICD-10-CM

## 2014-06-29 DIAGNOSIS — F03918 Unspecified dementia, unspecified severity, with other behavioral disturbance: Secondary | ICD-10-CM

## 2014-06-29 DIAGNOSIS — M549 Dorsalgia, unspecified: Secondary | ICD-10-CM | POA: Insufficient documentation

## 2014-06-29 DIAGNOSIS — M545 Low back pain, unspecified: Secondary | ICD-10-CM

## 2014-06-29 DIAGNOSIS — I714 Abdominal aortic aneurysm, without rupture, unspecified: Secondary | ICD-10-CM

## 2014-06-29 DIAGNOSIS — I10 Essential (primary) hypertension: Secondary | ICD-10-CM

## 2014-06-29 NOTE — Progress Notes (Signed)
Patient ID: Andre Marsh, male   DOB: 10/11/1926, 81 y.o.   MRN: [id number]   this is a routine visit.  Level care skilled.  Facility [facility].     Chief complaint- management of chronic medical conditions including dementia-hypertension-3 cardiac-bipolar disorder-anxiety-GERD   History of present illness.  Patient is a pleasant 81 year old male with appears to be relatively stable-although he remains somewhat anxious  When I saw him this past autumn he did complain of intermittent headache --a CT scan was considered--nonetheless he does not really complain of headache today he has been at his baseline-neurologically does not have any complaints and apparently has not had for some time.    t --   He's also followed by ophthalmology he is status post cataract surgery and receiving topical eyedrops currently.  He was also seen  by cardiology decreased his lisinopril dose and discontinued his Metroprolol-he does have some history of bradycardia as well as low blood pressure-this appears stable recently pulses in the 60s-80-recent blood pressures 119/70-136/70--I did take orthostatic blood pressures today and I  got 110/54 lying and sitting-standing it rose slightly to 118/61   .  Patient was also seen by psychiatric services for a significant history of dementia as well as bipolar disorder and anxiety this appears to be relatively stable although he does appear anxious at times-he continues on Ativan when necessary-he is also on Lamictal twice a day as well as galantamine and Invega QHS  Patient does not complaini of any chest pain or shortness of breath-he was hospitalized proximally 5 months ago for chest pain-no acute etiology was found he was found to be bradycardic and adjustments were made to his beta blocker and he is followed by cardiology-again cardiology has discontinued his Metroprolol and decreased his lisinopril-  Apparently several weeks ago patient did complain of some  rib pain--apparently after a fall o however x-ray did not show any acute fracture- did show some diffuse interstitial edema-clinically patient has been stable we will repeat the chest x-ray for follow-up He is not really complaining of any rib pain-- today does complain of some lower left back pain-again we will x-ray the area again      .  Past Medical History   Diagnosis  Date   .  Bipolar 1 disorder    .  Hypertension    .  Anxiety    .  Paralysis agitans    .  Alzheimer disease    .  Depression    .  Fall    .  GERD (gastroesophageal reflux disease)    .  Cataract      bilateral   .  AAA (abdominal aortic aneurysm)    .  CAD (coronary artery disease)      a. patient reported nonobstructive CAD. b. patient reported cath 2014 @ [facility]   .  Hyperlipidemia     Past Surgical History   Procedure  Laterality  Date   .  Cataract extraction  Left       Medication List                    acetaminophen 325 MG tablet    Commonly known as: TYLENOL    Take 650 mg by mouth every 4 (four) hours as needed for mild pain.    aspirin 325 MG EC tablet    Take 325 mg by mouth daily.    atorvastatin 20 MG tablet    Commonly known as:  LIPITOR    Take 1 tablet (20 mg total) by mouth daily at 6 PM.    budesonide 3 MG 24 hr capsule    Commonly known as: ENTOCORT EC    Take 9 mg by mouth every morning.    docusate sodium 100 MG capsule    Commonly known as: COLACE    Take 100 mg by mouth daily.    DUREZOL 0.05 % Emul    Generic drug: Difluprednate    Place 1 drop into the left eye 2 (two) times daily. For 1 week, starting 8/19.    ENSURE    Take 237 mLs by mouth at bedtime. With snack    folic acid 1 MG tablet    Commonly known as: FOLVITE    Take 1 mg by mouth daily.    galantamine 8 MG 24 hr capsule    Commonly known as: RAZADYNE ER    Take 8 mg by mouth daily with breakfast.      ILEVRO 0.3 % Susp    Generic drug: Nepafenac    Place 1 drop into the left eye at bedtime. For 4 weeks, starting 01/24/2014    INVEGA 3 MG 24 hr tablet    Generic drug: paliperidone    Take 3 mg by mouth at bedtime. For paranoia and mood    isosorbide mononitrate 30 MG 24 hr tablet    Commonly known as: IMDUR    Take 30 mg by mouth daily.    lamoTRIgine 100 MG tablet    Commonly known as: LAMICTAL    Take 100 mg by mouth 2 (two) times daily.    lisinopril 10 MG tablet    Commonly known as: PRINIVIL,ZESTRIL    Take 1 tablet (10 mg total) by mouth daily.    loratadine 10 MG tablet    Commonly known as: CLARITIN    Take 10 mg by mouth daily.    LORazepam 0.5 MG tablet    Commonly known as: ATIVAN    Take 0.5 mg by mouth every 12 (twelve) hours as needed for anxiety.    nitroGLYCERIN 0.6 MG SL tablet    Commonly known as: NITROSTAT    Place 0.6 mg under the tongue every 5 (five) minutes as needed for chest pain.    omeprazole 20 MG capsule    Commonly known as: PRILOSEC    Take 20 mg by mouth 2 (two) times daily.    phenazopyridine 100 MG tablet    Commonly known as: PYRIDIUM    Take 100 mg by mouth 3 (three) times daily with meals.    senna-docusate 8.6-50 MG per tablet    Commonly known as: Senokot-S    Take 1 tablet by mouth daily.    tamsulosin 0.4 MG Caps capsule    Commonly known as: FLOMAX    Take 0.4 mg by mouth daily after supper.    tobramycin 0.3 % ophthalmic solution    Commonly known as: TOBREX    Place 1 drop into the left eye 4 (four) times daily. For 2 weeks following surgery. Starting 01/24/2014    traMADol 50 MG tablet    Commonly known as: ULTRAM    Take 50 mg by mouth See admin instructions. 50 mg every AM and every 8 hours as needed for severe pain    zolpidem 5 MG tablet    Commonly known as: AMBIEN    Take 2.5 mg by mouth at bedtime as needed for sleep.  Lisinopril  10 mg daily     No  orders of the defined types were placed in this encounter.  Immunization History   Administered  Date(s) Administered   .  Influenza Whole  03/21/2013   .  Pneumococcal Polysaccharide-23  02/03/2014    History   Substance Use Topics   .  Smoking status:  Never Smoker   .  Smokeless tobacco:  Not on file   .  Alcohol Use:  Yes      Comment: wine occasionally   Family history is noncontributory  Review of Systems  DATA OBTAINED: from patient-nursing staff GENERAL: Feels well no fevers, fatigue, appetite changes  SKIN: No itching, rash or wounds  EYES: No eye pain, redness, discharge  EARS: No earache, tinnitus, change in hearing  NOSE: No congestion, drainage or bleeding  MOUTH/THROAT: No mouth or tooth pain, No sore throat  RESPIRATORY: No cough, wheezing, SOB  CARDIAC: No chest pain, palpitations, lower extremity edema  GI: No abdominal pain, No N/V/D or constipation, No heartburn or reflux-- history of GErD  GU: No dysuria, frequency or urgency, or incontinence  MUSCULOSKELETAL: Is complaining of some lower left back pain NEUROLOGIC:nol headache, no dizziness or focal weakness  PSYCHIATRIC: No overt anxiety or sadness. Sleeps well.                     Physical Exam  He is afebrile pulse 70 respirations 20 blood pressure 110/54 lying and sitting-118/60 standing GENERAL APPEARANCE: Alert, conversant. Appropriately groomed. No acute distress.  SKIN: No diaphoresis rash  HEAD: Normocephalic, atraumatic  EYES: Conjunctiva/lids clear. Pupils round, reactive. EOMs intact.  EARS: External exam WNL, canals clear. Hearing grossly normal.  NOSE: No deformity or discharge.  MOUTH/THROAT: Lips w/o lesions oropharynx clear mucous membranes moist  RESPIRATORY: Breathing is even, unlabored. Lung sounds are clear  CARDIOVASCULAR: Heart RRR no murmurs, rubs or gallops. No peripheral edema.  GASTROINTESTINAL: Abdomen is  soft, non-tender, not distended w/ normal bowel sounds   MUSCULOSKELETAL: No abnormal joints or musculature does ambulate about the facility in his walker-there is some tenderness to palpation of his lower left back thorax area I do not see any deformities NEUROLOGIC: Cranial nerves 2-12 grossly intact. Moves all extremities no tremor.--I did not appreciate any lateralizing findings  PSYCHIATRIC: Mood and affect appropriate to situation, no behavioral issues Has anxiety at times Patient Active Problem List    Diagnosis  Date Noted   .  Chest pain  02/08/2014   .  Bradycardia  02/03/2014   .  Near syncope  02/01/2014   .  Hypotension  02/01/2014   .  Dementia with behavioral disturbance  01/24/2014   .  AAA (abdominal aortic aneurysm) without rupture  10/19/2013   .  Pneumonia  09/28/2013   .  Bipolar disorder, unspecified  02/04/2013   .  GERD (gastroesophageal reflux disease)  02/04/2013   .  Hyperglycemia  12/13/2012   .  Unspecified constipation  10/22/2012   .  Pure hypercholesterolemia  10/22/2012   .  Essential hypertension, benign  10/22/2012   .  Allergic rhinitis, cause unspecified  10/22/2012   Labs.  03/17/2014.  TSH-3.33.  03/20/2014.  Sodium 139 potassium 4.4 BUN 20 creatinine 1.0.  Liver function tests within normal limits of note albumin is 4.1 WBC 6.7 hemoglobin 12.2 platelets 194.    03/20/2014.  WBC 6.7 hemoglobin 12.2 platelets 194 . Sodium 139 potassium 4.4 BUN 20 creatinine 1.0  .ZOX0.96  Component  Value  Date/Time    WBC  6.4  02/02/2014 0002    RBC  3.53*  02/02/2014 0002    HGB  11.8*  02/02/2014 0002    HCT  35.3*  02/02/2014 0002    PLT  176  02/02/2014 0002    MCV  100.0  02/02/2014 0002    LYMPHSABS  2.5  12/17/2010 0445    MONOABS  0.4  12/17/2010 0445    EOSABS  0.4  12/17/2010 0445    BASOSABS  0.0  12/17/2010 0445   CMP    Component  Value   Date/Time    NA  141  02/03/2014 0410    K  4.4  02/03/2014 0410    CL  103  02/03/2014 0410    CO2  26  02/03/2014 0410    GLUCOSE  87  02/03/2014 0410    BUN  18  02/03/2014 0410    CREATININE  1.05  02/03/2014 0410    CALCIUM  9.4  02/03/2014 0410    PROT  6.6  12/16/2010 1220    ALBUMIN  3.6  12/16/2010 1220    AST  16  12/16/2010 1220    ALT  15  12/16/2010 1220    ALKPHOS  77  12/16/2010 1220    BILITOT  0.3  12/16/2010 1220    GFRNONAA  63*  02/03/2014 0410    GFRAA  73*  02/03/2014 0410   Assessment and Plan  Bradycardia  Nodal blocking agentsdis continue per cardiology 3--  During  hospitalization....  - Cardiac enzymes serially neg  - D-dimer was elevated with follow up CTA neg for PE  - 2-D echo done, and was unremarkable  Appears pulses run largely in the 60-80s lately  -,  Chest pain  -Serial trop neg  -2D echo done, unremarkable results  -D-dimer was mildly elevated with f/u CTA chest neg for PE  BNP on admit To hospital [facility] and chest x-ray  consistent with scarring  Essential hypertension, benign  Metroprolol been discontinued and lisinopril dose reduced  -This point monitor-appears to be relatively stable as noted above  AAA (abdominal aortic aneurysm) without rupture  he is followed by cardiology, outpatient -- . Recent ultrasound showed 3.25-- 3.4 cm- -denies abd pain --ultrasound was done last May Dementia with behavioral disturbance  Continue razadyne and ativan--this appears relatively stable--He is followed by psychiatric services  Bipolar disorder, unspecified  invega, lomictal --this appears stable as well Pure hypercholesterolimia-update lipid panel LDL back in June was 125 would like to update this--secondary to patient'srelatively advanced age would be rather conservative here     Allergic rhinitis-continues on Claritin this appears to be stable currently.  Gerd--he  continues on Prilosec twice a day this appears to be stable as well.  Eye issues-he is followed by ophthalmology he continues on numerous topical drops-this appears to be relatively stable  History lower left back pain-we'll x-ray the area as well as get an updated chest x-ray to follow-up any vascular congestion I do note during hospitalization BNP was only in the 200s-will update another BNP  \ as well as updated CBC and metabolic panel and lipid panel and hemoglobin A1c  .  [id number]--of note greater than 35 minutes spent assessing patient-reviewing his chart-discussing patient's concerns at bedside-and coordinating and formulating a plan of care for numerous diagnoses-of note greater than 50% of time spent coordinating plan of care with patient's input

## 2014-06-30 LAB — LIPID PANEL
CHOLESTEROL: 115 mg/dL (ref 0–200)
HDL: 45 mg/dL (ref 35–70)
LDL CALC: 58 mg/dL
Triglycerides: 61 mg/dL (ref 40–160)

## 2014-06-30 LAB — HEMOGLOBIN A1C: HEMOGLOBIN A1C: 5.4 % (ref 4.0–6.0)

## 2014-08-10 ENCOUNTER — Non-Acute Institutional Stay (SKILLED_NURSING_FACILITY): Payer: Medicare Other | Admitting: Internal Medicine

## 2014-08-10 DIAGNOSIS — I714 Abdominal aortic aneurysm, without rupture, unspecified: Secondary | ICD-10-CM

## 2014-08-10 DIAGNOSIS — F0391 Unspecified dementia with behavioral disturbance: Secondary | ICD-10-CM

## 2014-08-10 DIAGNOSIS — F03918 Unspecified dementia, unspecified severity, with other behavioral disturbance: Secondary | ICD-10-CM

## 2014-08-10 DIAGNOSIS — K219 Gastro-esophageal reflux disease without esophagitis: Secondary | ICD-10-CM | POA: Diagnosis not present

## 2014-08-10 DIAGNOSIS — I10 Essential (primary) hypertension: Secondary | ICD-10-CM

## 2014-08-10 DIAGNOSIS — F3170 Bipolar disorder, currently in remission, most recent episode unspecified: Secondary | ICD-10-CM

## 2014-08-10 DIAGNOSIS — Z1211 Encounter for screening for malignant neoplasm of colon: Secondary | ICD-10-CM | POA: Diagnosis not present

## 2014-08-10 NOTE — Progress Notes (Signed)
Patient ID: Andre Marsh, male   DOB: 09-01-1926, 81 y.o.   MRN: [id number]   his is a routine visit.  Level care skilled.  Facility [facility].     Chief complaint- management of chronic medical conditions including dementia-hypertension-3 cardiac-bipolar disorder-anxiety-GERD   History of present illness.  Patient is a pleasant 81 year old male with appears to be relatively stable-although he remains somewhat anxious  When I saw him this past autumn he did complain of intermittent headache --a CT scan was considered--nonetheless he does not really complain of headache today he has been at his baseline-neurologically does not have any complaints and apparently has not had for some time--apparently he has refused the CT scan.    t --   He's also followed by ophthalmology he is status post cataract surgery and receiving topical eyedrops currently.  He was also seen  by cardiology decreased his lisinopril dose and discontinued his Metroprolol-he does have some history of bradycardia as well as low blood pressure-this appears stable recently pulses in the 60s-80-recent blood pressures 1 110/61-132/70 151/81 most recently I do not see consistent elevations or depressions    .  Patient was also seen by psychiatric services for a significant history of dementia as well as bipolar disorder and anxiety this appears to be relatively stable although he does appear anxious at times-he continues on Ativan when necessary-he is also on Lamictal twice a day as well as galantamine and Invega QHS  Patient does not complaini of any chest pain or shortness of breath-he was hospitalized proximally 6 months ago for chest pain-no acute etiology was found he was found to be bradycardic and adjustments were made to his beta blocker and he is followed by cardiology-again cardiology has discontinued his Metroprolol and decreased his lisinopril-  Apparently l weeks ago patient did complain of some rib  pain--apparently after a fall o however x-ray did not show any acute fracture- did show some diffuse interstitial edema-clinically patient has been stable  He is not really complaining of any rib pain-- does complain of back pain and xray shows it appears largely degenerative changes  Patient's main complaint today is he feels he may have colon cancer-he is concerned because his sister died of colon cancer.  He does not report really any abdominal pain or blood in his stool but nonetheless he remains concerned do not see any recent GI history  He does feel a somewhat weak he states although this can be an intermittent complaint of patient at times  \       .  Past Medical History   Diagnosis  Date   .  Bipolar 1 disorder    .  Hypertension    .  Anxiety    .  Paralysis agitans    .  Alzheimer disease    .  Depression    .  Fall    .  GERD (gastroesophageal reflux disease)    .  Cataract      bilateral   .  AAA (abdominal aortic aneurysm)    .  CAD (coronary artery disease)      a. patient reported nonobstructive CAD. b. patient reported cath 2014 @ [facility]   .  Hyperlipidemia     Past Surgical History   Procedure  Laterality  Date   .  Cataract extraction  Left       Medication List  acetaminophen 325 MG tablet    Commonly known as: TYLENOL    Take 650 mg by mouth every 4 (four) hours as needed for mild pain.    aspirin 325 MG EC tablet    Take 325 mg by mouth daily.    atorvastatin 20 MG tablet    Commonly known as: LIPITOR    Take 1 tablet (20 mg total) by mouth daily at 6 PM.    budesonide 3 MG 24 hr capsule    Commonly known as: ENTOCORT EC    Take 9 mg by mouth every morning.    docusate sodium 100 MG capsule    Commonly known as: COLACE    Take 100 mg by mouth daily.    DUREZOL 0.05 % Emul    Generic drug: Difluprednate    Place 1 drop into the  left eye 2 (two) times daily. For 1 week, starting 8/19.    ENSURE    Take 237 mLs by mouth at bedtime. With snack    folic acid 1 MG tablet    Commonly known as: FOLVITE    Take 1 mg by mouth daily.    galantamine 8 MG 24 hr capsule    Commonly known as: RAZADYNE ER    Take 8 mg by mouth daily with breakfast.    ILEVRO 0.3 % Susp    Generic drug: Nepafenac    Place 1 drop into the left eye at bedtime. For 4 weeks, starting 01/24/2014    INVEGA 3 MG 24 hr tablet    Generic drug: paliperidone    Take 3 mg by mouth at bedtime. For paranoia and mood    isosorbide mononitrate 30 MG 24 hr tablet    Commonly known as: IMDUR    Take 30 mg by mouth daily.    lamoTRIgine 100 MG tablet    Commonly known as: LAMICTAL    Take 100 mg by mouth 2 (two) times daily.    lisinopril 10 MG tablet    Commonly known as: PRINIVIL,ZESTRIL    Take 1 tablet (10 mg total) by mouth daily.    loratadine 10 MG tablet    Commonly known as: CLARITIN    Take 10 mg by mouth daily.    LORazepam 0.5 MG tablet    Commonly known as: ATIVAN    Take 0.5 mg by mouth every 12 (twelve) hours as needed for anxiety.    nitroGLYCERIN 0.6 MG SL tablet    Commonly known as: NITROSTAT    Place 0.6 mg under the tongue every 5 (five) minutes as needed for chest pain.    omeprazole 20 MG capsule    Commonly known as: PRILOSEC    Take 20 mg by mouth 2 (two) times daily.    phenazopyridine 100 MG tablet    Commonly known as: PYRIDIUM    Take 100 mg by mouth 3 (three) times daily with meals.    senna-docusate 8.6-50 MG per tablet    Commonly known as: Senokot-S    Take 1 tablet by mouth daily.    tamsulosin 0.4 MG Caps capsule    Commonly known as: FLOMAX    Take 0.4 mg by mouth daily after supper.    tobramycin 0.3 % ophthalmic solution    Commonly known as: TOBREX    Place 1 drop into the left eye 4 (four) times daily. For 2 weeks following surgery. Starting  01/24/2014    traMADol 50 MG tablet  Commonly known as: ULTRAM    Take 50 mg by mouth See admin instructions. 50 mg every AM and every 8 hours as needed for severe pain    zolpidem 5 MG tablet    Commonly known as: AMBIEN    Take 2.5 mg by mouth at bedtime as needed for sleep.  Lisinopril 10 mg daily     No orders of the defined types were placed in this encounter.  Immunization History   Administered  Date(s) Administered   .  Influenza Whole  03/21/2013   .  Pneumococcal Polysaccharide-23  02/03/2014    History   Substance Use Topics   .  Smoking status:  Never Smoker   .  Smokeless tobacco:  Not on file   .  Alcohol Use:  Yes      Comment: wine occasionally   Family history is noncontributory  Review of Systems  DATA OBTAINED: from patient-nursing staff GENERAL: Feels well no fevers, fatigue, appetite changes  SKIN: No itching, rash or wounds  EYES: No eye pain, redness, discharge  EARS: No earache, tinnitus, change in hearing  NOSE: No congestion, drainage or bleeding  MOUTH/THROAT: No mouth or tooth pain, No sore throat  RESPIRATORY: No cough, wheezing, SOB  CARDIAC: No chest pain, palpitations, lower extremity edema  GI: No abdominal pain, No N/V/D occasional constipation, No heartburn or reflux-- history of GErD  GU: No dysuria, frequency or urgency, or incontinence  MUSCULOSKELETAL: Does not complaining of joint or back pain today NEUROLOGIC:nol headache, no dizziness or focal weakness  PSYCHIATRIC: No overt anxiety or sadness. Sleeps well.                     Physical Exam  Temperature 97.0 pulse 70 respirations 18 blood pressure 110/61 GENERAL APPEARANCE: Alert, conversant. Appropriately groomed. No acute distress.  SKIN: No diaphoresis rash  HEAD: Normocephalic, atraumatic  EYES: Conjunctiva/lids clear. Pupils round, reactive. EOMs intact.  EARS: External exam WNL, canals clear. Hearing  grossly normal.  NOSE: No deformity or discharge.  MOUTH/THROAT: Lips w/o lesions oropharynx clear mucous membranes moist  RESPIRATORY: Breathing is even, unlabored. Lung sounds are clear  CARDIOVASCULAR: Heart RRR no murmurs, rubs or gallops. No peripheral edema.  GASTROINTESTINAL: Abdomen is soft, non-tender, not distended w/ normal bowel sounds  Rectal-I did do a guaiac stool digital exam this was negative for occult blood  MUSCULOSKELETAL: No abnormal joints or musculature does ambulate about the facility in his walker- s NEUROLOGIC: Cranial nerves 2-12 grossly intact. Moves all extremities no tremor.--I did not appreciate any lateralizing findings  PSYCHIATRIC: Mood and affect appropriate to situation, no behavioral issues Has anxiety at times Patient Active Problem List    Diagnosis  Date Noted   .  Chest pain  02/08/2014   .  Bradycardia  02/03/2014   .  Near syncope  02/01/2014   .  Hypotension  02/01/2014   .  Dementia with behavioral disturbance  01/24/2014   .  AAA (abdominal aortic aneurysm) without rupture  10/19/2013   .  Pneumonia  09/28/2013   .  Bipolar disorder, unspecified  02/04/2013   .  GERD (gastroesophageal reflux disease)  02/04/2013   .  Hyperglycemia  12/13/2012   .  Unspecified constipation  10/22/2012   .  Pure hypercholesterolemia  10/22/2012   .  Essential hypertension, benign  10/22/2012   .  Allergic rhinitis, cause unspecified  10/22/2012   Labs.  06/30/2014.  WBC 7.2 hemoglobin 11.0 platelets  184.  Hemoglobin A1c 5.4.  Cholesterol 1:15 HDL 45 LDL 58 triglycerides 61.  Sodium 138 potassium 4.2 BUN 29 creatinine 1.0-liver function tests within normal limits of note albumin was 3.8  03/17/2014.  TSH-3.33.  03/20/2014.  Sodium 139 potassium 4.4 BUN 20 creatinine 1.0.  Liver function tests within normal limits of note albumin is 4.1 WBC 6.7 hemoglobin 12.2 platelets  194.    03/20/2014.  WBC 6.7 hemoglobin 12.2 platelets 194 . Sodium 139 potassium 4.4 BUN 20 creatinine 1.0  .TSH3.33    Component  Value  Date/Time    WBC  6.4  02/02/2014 0002    RBC  3.53*  02/02/2014 0002    HGB  11.8*  02/02/2014 0002    HCT  35.3*  02/02/2014 0002    PLT  176  02/02/2014 0002    MCV  100.0  02/02/2014 0002    LYMPHSABS  2.5  12/17/2010 0445    MONOABS  0.4  12/17/2010 0445    EOSABS  0.4  12/17/2010 0445    BASOSABS  0.0  12/17/2010 0445   CMP    Component  Value  Date/Time    NA  141  02/03/2014 0410    K  4.4  02/03/2014 0410    CL  103  02/03/2014 0410    CO2  26  02/03/2014 0410    GLUCOSE  87  02/03/2014 0410    BUN  18  02/03/2014 0410    CREATININE  1.05  02/03/2014 0410    CALCIUM  9.4  02/03/2014 0410    PROT  6.6  12/16/2010 1220    ALBUMIN  3.6  12/16/2010 1220    AST  16  12/16/2010 1220    ALT  15  12/16/2010 1220    ALKPHOS  77  12/16/2010 1220    BILITOT  0.3  12/16/2010 1220    GFRNONAA  63*  02/03/2014 0410    GFRAA  73*  02/03/2014 0410   Assessment and Plan  Bradycardia  Nodal blocking agentsdis continue per cardiology 3--  During  hospitalization....  - Cardiac enzymes serially neg  - D-dimer was elevated with follow up CTA neg for PE  - 2-D echo done, and was unremarkable  Appears pulses run largely in the 60-80s lately  -,  Chest pain  -Serial trop neg  -2D echo done, unremarkable results  -D-dimer was mildly elevated with f/u CTA chest neg for PE  BNP on admit To hospital of 293.7 and chest x-ray  consistent with scarring There've been no further episodes for some time   Essential hypertension, benign  Metroprolol been discontinued and lisinopril dose reduced  -This point monitor-appears to be relatively stable as noted above   AAA (abdominal aortic aneurysm) without rupture  he is followed by cardiology,  outpatient -- . Recent ultrasound showed 3.25-- 3.4 cm- -denies abd pain --ultrasound was done last May  Dementia with behavioral disturbance  Continue razadyne and ativan--this appears relatively stable--He is followed by psychiatric services   Bipolar disorder, unspecified  invega, lomictal --this appears stable as well  Pure hypercholesterolimia-lipid panel in January was stable showing cholesterol 1:15 LDL of 58 HDL 45--secondary to patient'srelatively advanced age would be rather conservative here     Allergic rhinitis-continues on Claritin this appears to be stable currently.  Gerd--he continues on Prilosec twice a day this appears to be stable as well.  Eye issues-he is followed by ophthalmology he continues on numerous topical drops-this  appears to be relatively stable  In regards to patient's concern about colon cancer-I did have a fairly extensive discussion with him about this at bedside will order guaic stools 3 and also order initial lab work including a CBC and CMP-I discussed  this with Dr. Lyn Hollingshead via phone as well- Also will order a TSH with his complaints of weakness although this is not totally new and appears to be somewhat intermittent     .  [id number]--of note greater than 35 minutes spent assessing patient-reviewing his chart-discussing patient's concerns at bedside-and coordinating and formulating a plan of care for numerous diagnoses-of note greater than 50% of time spent coordinating plan of care with patient's input

## 2014-08-11 LAB — BASIC METABOLIC PANEL
BUN: 26 mg/dL — AB (ref 4–21)
Creatinine: 1 mg/dL (ref <=1.3)
Glucose: 110 mg/dL
Potassium: 4.6 mmol/L (ref 3.4–5.3)
Sodium: 141 mmol/L (ref 137–147)

## 2014-08-11 LAB — HEPATIC FUNCTION PANEL
ALT: 17 U/L (ref 10–40)
AST: 15 U/L (ref 14–40)
Alkaline Phosphatase: 65 U/L (ref 25–125)
Bilirubin, Total: 0.6 mg/dL

## 2014-08-11 LAB — CBC AND DIFFERENTIAL
HCT: 37 % — AB (ref 41–53)
Hemoglobin: 11.9 g/dL — AB (ref 13.5–17.5)
Platelets: 182 10*3/uL (ref 150–399)
WBC: 7.7 10^3/mL

## 2014-08-11 LAB — TSH: TSH: 3.39 u[IU]/mL (ref <=5.90)

## 2014-08-13 ENCOUNTER — Encounter: Payer: Self-pay | Admitting: Internal Medicine

## 2014-10-25 ENCOUNTER — Encounter: Payer: Self-pay | Admitting: *Deleted

## 2014-11-17 ENCOUNTER — Non-Acute Institutional Stay (SKILLED_NURSING_FACILITY): Payer: Medicare Other | Admitting: Internal Medicine

## 2014-11-17 ENCOUNTER — Encounter: Payer: Self-pay | Admitting: Internal Medicine

## 2014-11-17 DIAGNOSIS — K219 Gastro-esophageal reflux disease without esophagitis: Secondary | ICD-10-CM | POA: Diagnosis not present

## 2014-11-17 DIAGNOSIS — E7800 Pure hypercholesterolemia, unspecified: Secondary | ICD-10-CM

## 2014-11-17 DIAGNOSIS — F03918 Unspecified dementia, unspecified severity, with other behavioral disturbance: Secondary | ICD-10-CM

## 2014-11-17 DIAGNOSIS — F0391 Unspecified dementia with behavioral disturbance: Secondary | ICD-10-CM

## 2014-11-17 DIAGNOSIS — F250 Schizoaffective disorder, bipolar type: Secondary | ICD-10-CM

## 2014-11-17 DIAGNOSIS — I10 Essential (primary) hypertension: Secondary | ICD-10-CM | POA: Diagnosis not present

## 2014-11-17 DIAGNOSIS — F259 Schizoaffective disorder, unspecified: Secondary | ICD-10-CM | POA: Insufficient documentation

## 2014-11-17 DIAGNOSIS — J3089 Other allergic rhinitis: Secondary | ICD-10-CM

## 2014-11-17 NOTE — Assessment & Plan Note (Signed)
With paranoia, somatic c/o, ruminations;plan - continue lamictal 100mg  BID

## 2014-11-17 NOTE — Assessment & Plan Note (Signed)
No great declines, psych dx stable;plan-continue razodyne and ativan

## 2014-11-17 NOTE — Progress Notes (Signed)
MRN: [id number] Name: Andre Marsh  Sex: male Age: 81 y.o. DOB: 02/14/1927  [facility] #: [name] Facility/Room:307 Level Of Care: SNF Provider: Merrilee Seashore D Emergency Contacts: Extended Emergency Contact Information Primary Emergency Contact: [name],[name] Address: [address]          [facility] [address] [place] Home Phone: [phone] Mobile Phone: [phone] Relation: Daughter  Code Status: FULL  Allergies: Ciprofloxacin hcl; Cocoa; Codeine; Hydrocodone; Librax; Loperamide hcl; Metaxalone; Penicillins; Sulfa antibiotics; and Tetracyclines & related  Chief Complaint  Patient presents with  . Medical Management of Chronic Issues    HPI: Patient is 81 y.o. male who is being seen for routine issues.  Past Medical History  Diagnosis Date  . Bipolar 1 disorder   . Hypertension   . Anxiety   . Paralysis agitans   . Alzheimer disease   . Depression   . Fall   . GERD (gastroesophageal reflux disease)   . Cataract     bilateral  . AAA (abdominal aortic aneurysm)   . CAD (coronary artery disease)     a. patient reported nonobstructive CAD. b. patient reported cath 2014 @ [facility]  . Hyperlipidemia     Past Surgical History  Procedure Laterality Date  . Cataract extraction Left       Medication List       This list is accurate as of: 11/17/14 11:59 PM.  Always use your most recent med list.               acetaminophen 325 MG tablet  Commonly known as:  TYLENOL  Take 650 mg by mouth every 4 (four) hours as needed for mild pain.     aspirin 325 MG EC tablet  Take 325 mg by mouth daily. For parkinson disease     atorvastatin 20 MG tablet  Commonly known as:  LIPITOR  Take 1 tablet (20 mg total) by mouth daily at 6 PM.     docusate sodium 100 MG capsule  Commonly known as:  COLACE  Take 100 mg by mouth daily. For constipation     ENSURE  Take 237 mLs by mouth at bedtime. With snack     folic acid 1 MG tablet  Commonly known as:   FOLVITE  Take 1 mg by mouth daily.     galantamine 8 MG 24 hr capsule  Commonly known as:  RAZADYNE ER  Take 8 mg by mouth daily with breakfast. For alzheimer's disease     INVEGA 3 MG 24 hr tablet  Generic drug:  paliperidone  Take 3 mg by mouth at bedtime. For paranoia and mood     isosorbide mononitrate 30 MG 24 hr tablet  Commonly known as:  IMDUR  Take 30 mg by mouth daily. For heart disease     lamoTRIgine 100 MG tablet  Commonly known as:  LAMICTAL  Take 100 mg by mouth 2 (two) times daily. For bipolar disorder     lisinopril 10 MG tablet  Commonly known as:  PRINIVIL,ZESTRIL  Take 1 tablet (10 mg total) by mouth daily.     loratadine 10 MG tablet  Commonly known as:  CLARITIN  Take 10 mg by mouth daily. For allergies     LORazepam 0.5 MG tablet  Commonly known as:  ATIVAN  Take 1 tablet (0.5 mg total) by mouth every 12 (twelve) hours as needed for anxiety.     nitroGLYCERIN 0.6 MG SL tablet  Commonly known as:  NITROSTAT  Place 0.6 mg under the tongue every 5 (five) minutes as needed for chest pain.     omeprazole 20 MG capsule  Commonly known as:  PRILOSEC  Take 20 mg by mouth 2 (two) times daily. For reflux     phenazopyridine 100 MG tablet  Commonly known as:  PYRIDIUM  Take 100 mg by mouth 3 (three) times daily with meals. For enlarged prostate     senna-docusate 8.6-50 MG per tablet  Commonly known as:  Senokot-S  Take 1 tablet by mouth daily.     tamsulosin 0.4 MG Caps capsule  Commonly known as:  FLOMAX  Take 0.4 mg by mouth daily after supper. For enlarged prostate     traMADol 50 MG tablet  Commonly known as:  ULTRAM  1 by mouth every 8 hours as needed for severe pain     zolpidem 5 MG tablet  Commonly known as:  AMBIEN  Take 2.5 mg by mouth at bedtime as needed for sleep.        No orders of the defined types were placed in this encounter.    Immunization History  Administered Date(s) Administered  . Influenza Whole 03/21/2013  .  Influenza-Unspecified 04/14/2014  . PPD Test 01/30/2010  . Pneumococcal Polysaccharide-23 02/03/2014    History  Substance Use Topics  . Smoking status: Never Smoker   . Smokeless tobacco: Not on file  . Alcohol Use: Yes     Comment: wine occasionally    Review of Systems  DATA OBTAINED: from patient, nurse, medical record GENERAL:  no fevers, fatigue, appetite changes SKIN: No itching, rash HEENT: No complaint RESPIRATORY: No cough, wheezing, SOB CARDIAC: No chest pain, palpitations, lower extremity edema  GI: No abdominal pain, No N/V/D or constipation, No heartburn or reflux  GU: No dysuria, frequency or urgency, or incontinence  MUSCULOSKELETAL: No unrelieved bone/joint pain NEUROLOGIC: No headache, dizziness  PSYCHIATRIC: No overt anxiety or sadness  Filed Vitals:   11/17/14 1643  BP: 112/61  Pulse: 66  Temp: 98.7 F (37.1 C)  Resp: 22    Physical Exam  GENERAL APPEARANCE: Alert, conversant, No acute distress  SKIN: No diaphoresis rash HEENT: Unremarkable RESPIRATORY: Breathing is even, unlabored  CARDIOVASCULAR:  No peripheral edema  GASTROINTESTINAL: Abdomen is not distended   GENITOURINARY: Bladder not distended  MUSCULOSKELETAL: No abnormal joints or musculature NEUROLOGIC: Cranial nerves 2-12 grossly intact. Moves all extremities PSYCHIATRIC: Mood and affect appropriate to situation, no behavioral issues  Patient Active Problem List   Diagnosis Date Noted  . Schizoaffective disorder 11/17/2014  . Back pain 06/29/2014  . Chest pain 02/08/2014  . Bradycardia 02/03/2014  . Near syncope 02/01/2014  . Hypotension 02/01/2014  . Dementia with behavioral disturbance 01/24/2014  . AAA (abdominal aortic aneurysm) without rupture 10/19/2013  . Pneumonia 09/28/2013  . Bipolar disorder 02/04/2013  . GERD (gastroesophageal reflux disease) 02/04/2013  . Hyperglycemia 12/13/2012  . Unspecified constipation 10/22/2012  . Pure hypercholesterolemia 10/22/2012   . Essential hypertension, benign 10/22/2012  . Allergic rhinitis 10/22/2012    CBC    Component Value Date/Time   WBC 7.7 08/11/2014   WBC 6.4 02/02/2014 0002   RBC 3.53* 02/02/2014 0002   HGB 11.9* 08/11/2014   HCT 37* 08/11/2014   PLT 182 08/11/2014   MCV 100.0 02/02/2014 0002   LYMPHSABS 2.5 12/17/2010 0445   MONOABS 0.4 12/17/2010 0445   EOSABS 0.4 12/17/2010 0445   BASOSABS 0.0 12/17/2010 0445    CMP  Component Value Date/Time   NA 141 08/11/2014   NA 141 02/03/2014 0410   K 4.6 08/11/2014   CL 103 02/03/2014 0410   CO2 26 02/03/2014 0410   GLUCOSE 87 02/03/2014 0410   BUN 26* 08/11/2014   BUN 18 02/03/2014 0410   CREATININE 1.0 08/11/2014   CREATININE 1.05 02/03/2014 0410   CALCIUM 9.4 02/03/2014 0410   PROT 6.6 12/16/2010 1220   ALBUMIN 3.6 12/16/2010 1220   AST 15 08/11/2014   ALT 17 08/11/2014   ALKPHOS 65 08/11/2014   BILITOT 0.3 12/16/2010 1220   GFRNONAA 63* 02/03/2014 0410   GFRAA 73* 02/03/2014 0410    Assessment and Plan  Schizoaffective disorder With paranoia, somatic c/o, ruminations;plan - continue lamictal 100mg  BID   Essential hypertension, benign Lisinopril was reduced from 10 mg to 5 mg for weak tired and dec BP;plan -continue lisinopril 5 mg   Pure hypercholesterolemia FLP 06/2014  LDL 58, HDL 45; 07/2014 - LFT's - nl Plan -Continue lipitor 20 mg   Dementia with behavioral disturbance No great declines, psych dx stable;plan-continue razodyne and ativan   GERD (gastroesophageal reflux disease) No signs or sx of reflux;plan - cont nue omeprazole 20 mg BID   Allergic rhinitis Severe allergy season this year;continue pt on loratadine 10 mg daily     Margit HanksALEXANDER, Randeep Biondolillo D, MD

## 2014-11-17 NOTE — Assessment & Plan Note (Signed)
No signs or sx of reflux;plan - cont nue omeprazole 20 mg BID

## 2014-11-17 NOTE — Assessment & Plan Note (Signed)
Severe allergy season this year;continue pt on loratadine 10 mg daily

## 2014-11-17 NOTE — Assessment & Plan Note (Signed)
FLP 06/2014  LDL 58, HDL 45; 07/2014 - LFT's - nl Plan -Continue lipitor 20 mg

## 2014-11-17 NOTE — Assessment & Plan Note (Signed)
Lisinopril was reduced from 10 mg to 5 mg for weak tired and dec BP;plan -continue lisinopril 5 mg

## 2014-11-21 ENCOUNTER — Non-Acute Institutional Stay (SKILLED_NURSING_FACILITY): Payer: Medicare Other | Admitting: Internal Medicine

## 2014-11-21 ENCOUNTER — Encounter: Payer: Self-pay | Admitting: Internal Medicine

## 2014-11-21 DIAGNOSIS — I9589 Other hypotension: Secondary | ICD-10-CM | POA: Diagnosis not present

## 2014-11-21 DIAGNOSIS — I10 Essential (primary) hypertension: Secondary | ICD-10-CM

## 2014-11-21 NOTE — Progress Notes (Signed)
Patient ID: CHAY MAZZONI, male   DOB: May 09, 1927, 81 y.o.   MRN: [id number] MRN: [id number] Name: Andre Marsh  Sex: male Age: 81 y.o. DOB: July 18, 1926  [facility] #: [name] Facility/Room:307 Level Of Care: SNF Provider: Roena Malady Emergency Contacts: Extended Emergency Contact Information Primary Emergency Contact: [name],[name] Address: [address]          [facility] [address] [place] Home Phone: [phone] Mobile Phone: [phone] Relation: Daughter  Code Status: FULL  Allergies: Ciprofloxacin hcl; Cocoa; Codeine; Hydrocodone; Librax; Loperamide hcl; Metaxalone; Penicillins; Sulfa antibiotics; and Tetracyclines & related  Chief complaint-acute visit follow-up hypertension-hypotension?  HPI: Patient is 81 y.o. male who is being seen for recorded blood pressure of 60/32 yesterday-apparently this was taken by machine and not done manually. Patient does not report currently any dizziness headache or syncopal-type feelings-said he did not feel all that great yesterday but he feels better today-does not really have specific complaints however  I do note at one point his lisinopril was decreased from 10 mg to 5 mg secondary to somewhat decreased blood pressures and apparent weakness-appears this was done by his cardiologist earlier this year.  I did take his blood pressures lying sitting and standing today lying I got 102/56-sitting I got 102/60-and standing I got 98/56.  He did not report any syncopal episodes or dizziness or lightheadedness and he was standing or sitting  .  Past Medical History  Diagnosis Date  . Bipolar 1 disorder   . Hypertension   . Anxiety   . Paralysis agitans   . Alzheimer disease   . Depression   . Fall   . GERD (gastroesophageal reflux disease)   . Cataract     bilateral  . AAA (abdominal aortic aneurysm)   . CAD (coronary artery disease)     a. patient reported nonobstructive CAD. b. patient reported cath 2014 @ [facility]  .  Hyperlipidemia     Past Surgical History  Procedure Laterality Date  . Cataract extraction Left       Medication List       This list is accurate as of: 11/21/14  3:36 PM.  Always use your most recent med list.               acetaminophen 325 MG tablet  Commonly known as:  TYLENOL  Take 650 mg by mouth every 4 (four) hours as needed for mild pain.     aspirin 325 MG EC tablet  Take 325 mg by mouth daily. For parkinson disease     atorvastatin 20 MG tablet  Commonly known as:  LIPITOR  Take 1 tablet (20 mg total) by mouth daily at 6 PM.     docusate sodium 100 MG capsule  Commonly known as:  COLACE  Take 100 mg by mouth daily. For constipation     ENSURE  Take 237 mLs by mouth at bedtime. With snack     folic acid 1 MG tablet  Commonly known as:  FOLVITE  Take 1 mg by mouth daily.     galantamine 8 MG 24 hr capsule  Commonly known as:  RAZADYNE ER  Take 8 mg by mouth daily with breakfast. For alzheimer's disease     INVEGA 3 MG 24 hr tablet  Generic drug:  paliperidone  Take 3 mg by mouth at bedtime. For paranoia and mood     isosorbide mononitrate 30 MG 24 hr tablet  Commonly known as:  IMDUR  Take 30 mg by mouth daily. For heart disease     lamoTRIgine 100 MG tablet  Commonly known as:  LAMICTAL  Take 100 mg by mouth 2 (two) times daily. For bipolar disorder     lisinopril 10 MG tablet  Commonly known as:  PRINIVIL,ZESTRIL  Take 1 tablet (10 mg total) by mouth daily.     loratadine 10 MG tablet  Commonly known as:  CLARITIN  Take 10 mg by mouth daily. For allergies     LORazepam 0.5 MG tablet  Commonly known as:  ATIVAN  Take 1 tablet (0.5 mg total) by mouth every 12 (twelve) hours as needed for anxiety.     nitroGLYCERIN 0.6 MG SL tablet  Commonly known as:  NITROSTAT  Place 0.6 mg under the tongue every 5 (five) minutes as needed for chest pain.     omeprazole 20 MG capsule  Commonly known as:  PRILOSEC  Take 20 mg by mouth 2 (two) times  daily. For reflux     phenazopyridine 100 MG tablet  Commonly known as:  PYRIDIUM  Take 100 mg by mouth 3 (three) times daily with meals. For enlarged prostate     senna-docusate 8.6-50 MG per tablet  Commonly known as:  Senokot-S  Take 1 tablet by mouth daily.     tamsulosin 0.4 MG Caps capsule  Commonly known as:  FLOMAX  Take 0.4 mg by mouth daily after supper. For enlarged prostate     traMADol 50 MG tablet  Commonly known as:  ULTRAM  1 by mouth every 8 hours as needed for severe pain     zolpidem 5 MG tablet  Commonly known as:  AMBIEN  Take 2.5 mg by mouth at bedtime as needed for sleep.        No orders of the defined types were placed in this encounter.    Immunization History  Administered Date(s) Administered  . Influenza Whole 03/21/2013  . Influenza-Unspecified 04/14/2014  . PPD Test 01/30/2010  . Pneumococcal Polysaccharide-23 02/03/2014    History  Substance Use Topics  . Smoking status: Never Smoker   . Smokeless tobacco: Not on file  . Alcohol Use: Yes     Comment: wine occasionally    Review of Systems  DATA OBTAINED: from patient, nurse, medical record GENERAL:  no fevers, fatigue, appetite changes SKIN: No itching, rash HEENT: No complaint RESPIRATORY: No cough, wheezing, SOB CARDIAC: No chest pain, palpitations, lower extremity edema  GI: No abdominal pain, No N/V/D or constipation, No heartburn or reflux  GU: No dysuria, frequency or urgency, or incontinence  MUSCULOSKELETAL: No unrelieved bone/joint pain NEUROLOGIC: No headache, dizziness  PSYCHIATRIC: No overt anxiety or sadness--does have a fairly extensive history of anxiety at times however  Filed Vitals:   11/21/14 1528  BP: 102/56  Pulse: 64  Temp: 97.9 F (36.6 C)  Resp: 17    Physical Exam  GENERAL APPEARANCE: Alert, conversant, No acute distress  SKIN: No diaphoresis rash HEENT: Unremarkable RESPIRATORY: Breathing is even, unlabored  CARDIOVASCULAR:  No  peripheral edema  GASTROINTESTINAL: Abdomen is not distended   GENITOURINARY: Bladder not distended  MUSCULOSKELETAL: No abnormal joints or musculature-general frailty is able to stand without assistance NEUROLOGIC: Cranial nerves 2-12 grossly intact. Moves all extremities PSYCHIATRIC: Mood and affect appropriate to situation, no behavioral issues  Patient Active Problem List   Diagnosis Date Noted  . Schizoaffective disorder 11/17/2014  . Back pain 06/29/2014  . Chest pain 02/08/2014  . Bradycardia 02/03/2014  .  Near syncope 02/01/2014  . Hypotension 02/01/2014  . Dementia with behavioral disturbance 01/24/2014  . AAA (abdominal aortic aneurysm) without rupture 10/19/2013  . Pneumonia 09/28/2013  . Bipolar disorder 02/04/2013  . GERD (gastroesophageal reflux disease) 02/04/2013  . Hyperglycemia 12/13/2012  . Unspecified constipation 10/22/2012  . Pure hypercholesterolemia 10/22/2012  . Essential hypertension, benign 10/22/2012  . Allergic rhinitis 10/22/2012    CBC    Component Value Date/Time   WBC 7.7 08/11/2014   WBC 6.4 02/02/2014 0002   RBC 3.53* 02/02/2014 0002   HGB 11.9* 08/11/2014   HCT 37* 08/11/2014   PLT 182 08/11/2014   MCV 100.0 02/02/2014 0002   LYMPHSABS 2.5 12/17/2010 0445   MONOABS 0.4 12/17/2010 0445   EOSABS 0.4 12/17/2010 0445   BASOSABS 0.0 12/17/2010 0445    CMP     Component Value Date/Time   NA 141 08/11/2014   NA 141 02/03/2014 0410   K 4.6 08/11/2014   CL 103 02/03/2014 0410   CO2 26 02/03/2014 0410   GLUCOSE 87 02/03/2014 0410   BUN 26* 08/11/2014   BUN 18 02/03/2014 0410   CREATININE 1.0 08/11/2014   CREATININE 1.05 02/03/2014 0410   CALCIUM 9.4 02/03/2014 0410   PROT 6.6 12/16/2010 1220   ALBUMIN 3.6 12/16/2010 1220   AST 15 08/11/2014   ALT 17 08/11/2014   ALKPHOS 65 08/11/2014   BILITOT 0.3 12/16/2010 1220   GFRNONAA 63* 02/03/2014 0410   GFRAA 73* 02/03/2014 0410    Assessment and Plan  Hypertension-at this point I  do not really see any elevated readings I did review his blood pressures as noted above-I see previous readings I suspect these were taken by machine of 107/60-110/67-116/68-the highest one that I see is 144/78.  At this point will discontinue the lisinopri's  if cardiology is okay with thisl and monitor his blood pressures  He is on a minimal dose of the Lisinopril we will discontinue this pending his cardiologist's approval and see how he does clinically appears stable without any overt hypotensive symptoms  Check blood pressures and pulse twice a day with log in provider book for review next week     Also will update labs including a metabolic panel TSH as well as CBC.  [id number]    Morgane Joerger C,

## 2015-01-04 ENCOUNTER — Non-Acute Institutional Stay (SKILLED_NURSING_FACILITY): Payer: Medicare Other | Admitting: Internal Medicine

## 2015-01-04 DIAGNOSIS — F0391 Unspecified dementia with behavioral disturbance: Secondary | ICD-10-CM | POA: Diagnosis not present

## 2015-01-04 DIAGNOSIS — I10 Essential (primary) hypertension: Secondary | ICD-10-CM | POA: Diagnosis not present

## 2015-01-04 DIAGNOSIS — R0602 Shortness of breath: Secondary | ICD-10-CM | POA: Diagnosis not present

## 2015-01-04 DIAGNOSIS — F258 Other schizoaffective disorders: Secondary | ICD-10-CM | POA: Diagnosis not present

## 2015-01-04 DIAGNOSIS — F319 Bipolar disorder, unspecified: Secondary | ICD-10-CM | POA: Diagnosis not present

## 2015-01-04 DIAGNOSIS — F03918 Unspecified dementia, unspecified severity, with other behavioral disturbance: Secondary | ICD-10-CM

## 2015-01-04 NOTE — Progress Notes (Signed)
Patient ID: Andre Marsh, male   DOB: [birth year], 81 y.o.   MRN: [id number]     this is a routine acute visit.  Level care skilled.  Facility [facility].     Chief complaint- management of chronic medical conditions including dementia-hypertension -bipolar disorder-anxiety-GERD Acute visit secondary to complaints of shortness of breath   History of present illness.  Patient is a pleasant 81year-old male with appears to be relatively stable-although he remains somewhat anxious  He is complaining of some shortness of breath he said this occurred while walking-vital signs of been stable O2 sat rations have been in the 90s-I did reevaluate him a couple times and he said it had resolved-per nursing he does have occasional somatic complaints.  He did have slightly elevated respiratory rate at times but this appeared to be more  An anxiety issue his breathing was not labored lung sounds were clear I suspect this is largely  anxiety related complaints which resolved  When I saw him last autumn he did complain of intermittent headache --a CT scan was considered--nonetheless he does not really complain of headache today he has been at his baseline-neurologically does not have any complaints and apparently has not had for some time--apparently he has refused the CT scan.    t --   He's also followed by ophthalmology he is status post cataract surgery and receiving topical eyedrops currently.  He was also seen  by cardiology decreased his lisinopril dose and discontinued his Metroprolol-he does have some history of bradycardia as well as low blood pressure- 1 is on last month I discontinued his lisinopril secondary to some continued low blood pressures-his appears to have stabilized with blood pressures 136/70-119/70 136/64 recently the lowest one that I see is 100/60 but this is not the norm    .  Patient was also seen by psychiatric services for a significant history of dementia as well  as bipolar disorder and anxiety this appears to be relatively stable although he does appear anxious at times-he continues on Ativan when necessary-he is also on Lamictal twice a day as well as galantamine and Invega QHS   -he was hospitalizedabout a year ago for chest pain-no acute etiology was found he was found to be bradycardic and adjustments were made to his beta blocker and he is followed by cardiology-again cardiology has discontinued his Metroprolol  Other than the shortness of breath patient has no complaints today does not complain of any chest pain dizziness headache or syncopal-type feelings     \       .  Past Medical History   Diagnosis  Date   .  Bipolar 1 disorder    .  Hypertension    .  Anxiety    .  Paralysis agitans    .  Alzheimer disease    .  Depression    .  Fall    .  GERD (gastroesophageal reflux disease)    .  Cataract      bilateral   .  AAA (abdominal aortic aneurysm)    .  CAD (coronary artery disease)      a. patient reported nonobstructive CAD. b. patient reported cath 2014 @ [facility]   .  Hyperlipidemia     Past Surgical History   Procedure  Laterality  Date   .  Cataract extraction  Left       Medication List  acetaminophen 325 MG tablet    Commonly known as: TYLENOL    Take 650 mg by mouth every 4 (four) hours as needed for mild pain.    aspirin 325 MG EC tablet    Take 325 mg by mouth daily.    atorvastatin 20 MG tablet    Commonly known as: LIPITOR    Take 1 tablet (20 mg total) by mouth daily at 6 PM.    budesonide 3 MG 24 hr capsule    Commonly known as: ENTOCORT EC    Take 9 mg by mouth every morning.    docusate sodium 100 MG capsule    Commonly known as: COLACE    Take 100 mg by mouth daily.    DUREZOL 0.05 % Emul    Generic drug: Difluprednate    Place 1 drop into the left eye 2 (two) times daily. For 1  week, starting 8/19.    ENSURE    Take 237 mLs by mouth at bedtime. With snack    folic acid 1 MG tablet    Commonly known as: FOLVITE    Take 1 mg by mouth daily.    galantamine 8 MG 24 hr capsule    Commonly known as: RAZADYNE ER    Take 8 mg by mouth daily with breakfast.    ILEVRO 0.3 % Susp    Generic drug: Nepafenac    Place 1 drop into the left eye at bedtime. For 4 weeks, starting 01/24/2014    INVEGA 3 MG 24 hr tablet    Generic drug: paliperidone    Take 3 mg by mouth at bedtime. For paranoia and mood    isosorbide mononitrate 30 MG 24 hr tablet    Commonly known as: IMDUR    Take 30 mg by mouth daily.    lamoTRIgine 100 MG tablet    Commonly known as: LAMICTAL    Take 100 mg by mouth 2 (two) times daily.    lisinopril 10 MG tablet    Commonly known as: PRINIVIL,ZESTRIL    Take 1 tablet (10 mg total) by mouth daily.    loratadine 10 MG tablet    Commonly known as: CLARITIN    Take 10 mg by mouth daily.    LORazepam 0.5 MG tablet    Commonly known as: ATIVAN    Take 0.5 mg by mouth every 12 (twelve) hours as needed for anxiety.    nitroGLYCERIN 0.6 MG SL tablet    Commonly known as: NITROSTAT    Place 0.6 mg under the tongue every 5 (five) minutes as needed for chest pain.    omeprazole 20 MG capsule    Commonly known as: PRILOSEC    Take 20 mg by mouth 2 (two) times daily.    phenazopyridine 100 MG tablet    Commonly known as: PYRIDIUM    Take 100 mg by mouth 3 (three) times daily with meals.    senna-docusate 8.6-50 MG per tablet    Commonly known as: Senokot-S    Take 1 tablet by mouth daily.    tamsulosin 0.4 MG Caps capsule    Commonly known as: FLOMAX    Take 0.4 mg by mouth daily after supper.    tobramycin 0.3 % ophthalmic solution    Commonly known as: TOBREX    Place 1 drop into the left eye 4 (four) times daily. For 2 weeks following surgery. Starting 01/24/2014    traMADol 50 MG tablet  Commonly known as: ULTRAM    Take 50 mg by mouth See admin instructions. 50 mg every AM and every 8 hours as needed for severe pain    zolpidem 5 MG tablet    Commonly known as: AMBIEN    Take 2.5 mg by mouth at bedtime as needed for sleep.  Lisinopril 10 mg daily     No orders of the defined types were placed in this encounter.  Immunization History   Administered  Date(s) Administered   .  Influenza Whole  03/21/2013   .  Pneumococcal Polysaccharide-23  02/03/2014    History   Substance Use Topics   .  Smoking status:  Never Smoker   .  Smokeless tobacco:  Not on file   .  Alcohol Use:  Yes      Comment: wine occasionally   Family history is noncontributory  Review of Systems  DATA OBTAINED: from patient-nursing staff GENERAL:  no fevers, fatigue, appetite changes  SKIN: No itching, rash or wounds  EYES: No eye pain, redness, discharge  EARS: No earache, tinnitus, change in hearing  NOSE: No congestion, drainage or bleeding  MOUTH/THROAT: No mouth or tooth pain, No sore throat  RESPIRATORY: No cough, wheezing, intermitted SOB as noted above CARDIAC: No chest pain, palpitations, lower extremity edema  GI: No abdominal pain, No N/V/D occasional constipation, No heartburn or reflux-- history of GErD  GU: No dysuria, frequency or urgency, or incontinence  MUSCULOSKELETAL: Does not complaining of joint or back pain today NEUROLOGIC:nol headache, no dizziness or focal weakness  PSYCHIATRIC: No overt anxiety or sadness. Sleeps well.--He is on low-dose Ambien                     Physical Exam  He is afebrile pulse 60 respirations 24 blood pressure 136/70 O2 saturation 95% on room air GENERAL APPEARANCE: Alert, conversant. Appropriately groomed. No acute distress.  SKIN: No diaphoresis rash  HEAD: Normocephalic, atraumatic  EYES: Conjunctiva/lids clear. Pupils round, reactive. EOMs intact.  EARS: External  exam WNL, canals clear. Hearing grossly normal.  NOSE: No deformity or discharge.  MOUTH/THROAT: Lips w/o lesions oropharynx clear mucous membranes moist  RESPIRATORY: Breathing is even, unlabored. Lung sounds are clear at somewhat increased respiratory rate about 30 initially this was unlabored --I note respiratory rate came down significantly after I left in the room and came back and witnessed patient watching TV it was down into the low 20s CARDIOVASCULAR: Heart RRR no murmurs, rubs or gallops. No peripheral edema.  GASTROINTESTINAL: Abdomen is soft, non-tender, not distended w/ normal bowel sounds    MUSCULOSKELETAL: No abnormal joints or musculature does ambulate about the facility in his walker- s NEUROLOGIC: Cranial nerves 2-12 grossly intact. Moves all extremities no tremor.--I did not appreciate any lateralizing findings  PSYCHIATRIC: Mood and affect appropriate to situation, no behavioral issues Has anxiety at times Patient Active Problem List    Diagnosis  Date Noted   .  Chest pain  02/08/2014   .  Bradycardia  02/03/2014   .  Near syncope  02/01/2014   .  Hypotension  02/01/2014   .  Dementia with behavioral disturbance  01/24/2014   .  AAA (abdominal aortic aneurysm) without rupture  10/19/2013   .  Pneumonia  09/28/2013   .  Bipolar disorder, unspecified  02/04/2013   .  GERD (gastroesophageal reflux disease)  02/04/2013   .  Hyperglycemia  12/13/2012   .  Unspecified constipation  10/22/2012   .  Pure hypercholesterolemia  10/22/2012   .  Essential hypertension, benign  10/22/2012   .  Allergic rhinitis, cause unspecified  10/22/2012   Labs.  11/22/2014.  WBC 5.8 hemoglobin 11.3 platelets 167.  Sodium 137 potassium 4.6 BUN 24 creatinine 1.1-CO2 level XXVI.  Liver function tests within normal limits except albumen of 3.4.  TSH-2.33.    06/30/2014.  WBC 7.2 hemoglobin 11.0 platelets 184.  Hemoglobin A1c  5.4.  Cholesterol 1:15 HDL 45 LDL 58 triglycerides 61.  Sodium 138 potassium 4.2 BUN 29 creatinine 1.0-liver function tests within normal limits of note albumin was 3.8  03/17/2014.  TSH-3.33.  03/20/2014.  Sodium 139 potassium 4.4 BUN 20 creatinine 1.0.  Liver function tests within normal limits of note albumin is 4.1 WBC 6.7 hemoglobin 12.2 platelets 194.    03/20/2014.  WBC 6.7 hemoglobin 12.2 platelets 194 . Sodium 139 potassium 4.4 BUN 20 creatinine 1.0  .TSH3.33    Component  Value  Date/Time    WBC  6.4  02/02/2014 0002    RBC  3.53*  02/02/2014 0002    HGB  11.8*  02/02/2014 0002    HCT  35.3*  02/02/2014 0002    PLT  176  02/02/2014 0002    MCV  100.0  02/02/2014 0002    LYMPHSABS  2.5  12/17/2010 0445    MONOABS  0.4  12/17/2010 0445    EOSABS  0.4  12/17/2010 0445    BASOSABS  0.0  12/17/2010 0445   CMP    Component  Value  Date/Time    NA  141  02/03/2014 0410    K  4.4  02/03/2014 0410    CL  103  02/03/2014 0410    CO2  26  02/03/2014 0410    GLUCOSE  87  02/03/2014 0410    BUN  18  02/03/2014 0410    CREATININE  1.05  02/03/2014 0410    CALCIUM  9.4  02/03/2014 0410    PROT  6.6  12/16/2010 1220    ALBUMIN  3.6  12/16/2010 1220    AST  16  12/16/2010 1220    ALT  15  12/16/2010 1220    ALKPHOS  77  12/16/2010 1220    BILITOT  0.3  12/16/2010 1220    GFRNONAA  63*  02/03/2014 0410    GFRAA  73*  02/03/2014 0410   Assessment and Plan  Shortness of breath-again this appears to have resolved vital signs O2 sats have been stable-I did recheck on patient a couple times he appeared to be calm no labored breathing respiratory rate had come down-I suspect this is anxiety related but will check a chest x-ray also monitor vital signs pulse ox every shift for 48 hours keep benign on this.      Bradycardia  Nodal blocking agentsdis continue per cardiology 3--  During   hospitalization....  - Cardiac enzymes serially neg  - D-dimer was elevated with follow up CTA neg for PE  - 2-D echo done, and was unremarkable  Appears pulses run largely in the 60s lately  -,  Chest pain  -Serial trop neg  -2D echo done, unremarkable results  -D-dimer was mildly elevated with f/u CTA chest neg for PE  BNP on admit To hospital of 293.7 and chest x-ray  consistent with scarring There've been no further episodes for some time   Essential hypertension, benign  Metroprolol been discontinued as well as of the senna per L  -  This point monitor-appears to be relatively stable as noted above   AAA (abdominal aortic aneurysm) without rupture  he is followed by cardiology, outpatient -- . Recent ultrasound showed 3.25-- 3.4 cm- -denies abd pain --ultrasound was done a year ago in May--would like to see when next appointment is scheduled for follow-up will discuss this with nursing  Dementia with behavioral disturbance  Continue razadyne and ativan--this appears relatively stable--He is followed by psychiatric services   Bipolar disorder, unspecified  invega, lomictal --this appears stable as well  Pure hypercholesterolimia-lipid panel in January was stable showing cholesterol 1:15 LDL of 58 HDL 45--update lipid panel.  Also will update hemoglobin A1c secondary to high-risk meds and update a CBC and BMP as well for updated values  I do note his last hemoglobin was 11.3 I suspect this is chronic disease    [id number]-of note greater than 40 minutes spent assessing patient-reassessing patient-reviewing his chart-and coordinating and formulating a plan of care for numerous diagnoses-of note greater than 50% of time spent coordinating plan of care     Allergic rhinitis-continues on Claritin this appears to be stable currently.  Gerd--he continues on Prilosec twice a day this appears to be stable as well.  Eye issues-he is followed by ophthalmology he  continues on numerous topical drops-this appears to be relatively stable       .  [id number]--of note greater than 35 minutes spent assessing patient-reviewing his chart-discussing patient's concerns at bedside-and coordinating and formulating a plan of care for numerous diagnoses-of note greater than 50% of time spent coordinating plan of care with patient's input

## 2015-01-06 ENCOUNTER — Encounter: Payer: Self-pay | Admitting: Internal Medicine

## 2015-01-06 DIAGNOSIS — R0602 Shortness of breath: Secondary | ICD-10-CM | POA: Insufficient documentation

## 2015-01-17 ENCOUNTER — Other Ambulatory Visit: Payer: Self-pay | Admitting: *Deleted

## 2015-01-17 MED ORDER — LORAZEPAM 0.5 MG PO TABS: 0.5000 mg | ORAL_TABLET | Freq: Two times a day (BID) | ORAL | Status: DC | PRN

## 2015-03-01 ENCOUNTER — Encounter: Payer: Self-pay | Admitting: Internal Medicine

## 2015-03-01 ENCOUNTER — Non-Acute Institutional Stay (SKILLED_NURSING_FACILITY): Payer: Medicare Other | Admitting: Internal Medicine

## 2015-03-01 DIAGNOSIS — F0391 Unspecified dementia with behavioral disturbance: Secondary | ICD-10-CM

## 2015-03-01 DIAGNOSIS — F03918 Unspecified dementia, unspecified severity, with other behavioral disturbance: Secondary | ICD-10-CM

## 2015-03-01 DIAGNOSIS — I10 Essential (primary) hypertension: Secondary | ICD-10-CM | POA: Diagnosis not present

## 2015-03-01 DIAGNOSIS — I714 Abdominal aortic aneurysm, without rupture, unspecified: Secondary | ICD-10-CM

## 2015-03-01 DIAGNOSIS — F259 Schizoaffective disorder, unspecified: Secondary | ICD-10-CM | POA: Diagnosis not present

## 2015-03-01 DIAGNOSIS — I9589 Other hypotension: Secondary | ICD-10-CM

## 2015-03-01 DIAGNOSIS — R0781 Pleurodynia: Secondary | ICD-10-CM | POA: Insufficient documentation

## 2015-03-01 NOTE — Progress Notes (Signed)
Patient ID: Andre Marsh, male   DOB: 05-05-1927, 81 y.o.   MRN: [id number]      this is a routinevisit.  Level care skilled.  Facility [facility].     Chief complaint- management of chronic medical conditions including dementia-hypertension -bipolar disorder-anxiety-GERD Acute visit secondary to complaints of right thorax pain   History of present illness.  Patient is a pleasant 81year-old male with appears to be relatively stable-although he remains somewhat anxious  Today he is complaining of some mild what he describes as rib pain on the right-he states several days ago he fell apparently there were no apparent injuries but he is anxious thinking that possibly something may have happened to one of his right ribs he does not complain of any increased shortness of breath or difficulty breathing per nursing staff he is stable continues to ambulate at his baseline without increased shortness of breath  When I saw him last autumn he did complain of intermittent headache --a CT scan was considered--nonetheless he does not really complain of headache today he has been at his baseline-neurologically does not have any complaints and apparently has not had for some time--apparently he has refused the CT scan.    t --   He's also followed by ophthalmology he is status post cataract surgery and receiving topical eyedrops currently.  He was also seen  by cardiology decreased his lisinopril dose and discontinued his Metroprolol-he does have some history of bradycardia as well as low blood pressure- Couple months ago I discontinued his lisinopril secondary to some continued low blood pressures-his appears to have stabilized with blood pressures  129/61-123/57 132/60    .  Patient was also seen by psychiatric services for a significant history of dementia as well as bipolar disorder and anxiety this appears to be relatively stable although he does appear anxious at times-he continues on  Ativan when necessary-he is also on Lamictal twice a day as well as galantamine and Invega QHS   -he was hospitalizedabout a year ago for chest pain-no acute etiology was found he was found to be bradycardic and adjustments were made to his beta blocker and he is followed by cardiology-again cardiology has discontinued his Metroprolol  Other than the shortness of breath patient has no complaints today does not complain of any chest pain dizziness headache or syncopal-type feelings     \       .  Past Medical History   Diagnosis  Date   .  Bipolar 1 disorder    .  Hypertension    .  Anxiety    .  Paralysis agitans    .  Alzheimer disease    .  Depression    .  Fall    .  GERD (gastroesophageal reflux disease)    .  Cataract      bilateral   .  AAA (abdominal aortic aneurysm)    .  CAD (coronary artery disease)      a. patient reported nonobstructive CAD. b. patient reported cath 2014 @ [facility]   .  Hyperlipidemia     Past Surgical History   Procedure  Laterality  Date   .  Cataract extraction  Left       Medication List                    acetaminophen 325 MG tablet    Commonly known as: TYLENOL    Take 650 mg by mouth every 4 (  four) hours as needed for mild pain.    aspirin 325 MG EC tablet    Take 325 mg by mouth daily.    atorvastatin 20 MG tablet    Commonly known as: LIPITOR    Take 1 tablet (20 mg total) by mouth daily at 6 PM.    budesonide 3 MG 24 hr capsule    Commonly known as: ENTOCORT EC    Take 9 mg by mouth every morning.    docusate sodium 100 MG capsule    Commonly known as: COLACE    Take 100 mg by mouth daily.    DUREZOL 0.05 % Emul    Generic drug: Difluprednate    Place 1 drop into the left eye 2 (two) times daily. For 1 week, starting 8/19.    ENSURE    Take 237 mLs by mouth at bedtime. With snack    folic acid 1 MG tablet    Commonly  known as: FOLVITE    Take 1 mg by mouth daily.    galantamine 8 MG 24 hr capsule    Commonly known as: RAZADYNE ER    Take 8 mg by mouth daily with breakfast.    ILEVRO 0.3 % Susp    Generic drug: Nepafenac    Place 1 drop into the left eye at bedtime. For 4 weeks, starting 01/24/2014    INVEGA 3 MG 24 hr tablet    Generic drug: paliperidone    Take 3 mg by mouth at bedtime. For paranoia and mood    isosorbide mononitrate 30 MG 24 hr tablet    Commonly known as: IMDUR    Take 30 mg by mouth daily.    lamoTRIgine 100 MG tablet    Commonly known as: LAMICTAL    Take 100 mg by mouth 2 (two) times daily.    lisinopril 10 MG tablet    Commonly known as: PRINIVIL,ZESTRIL    Take 1 tablet (10 mg total) by mouth daily.    loratadine 10 MG tablet    Commonly known as: CLARITIN    Take 10 mg by mouth daily.    LORazepam 0.5 MG tablet    Commonly known as: ATIVAN    Take 0.5 mg by mouth every 12 (twelve) hours as needed for anxiety.    nitroGLYCERIN 0.6 MG SL tablet    Commonly known as: NITROSTAT    Place 0.6 mg under the tongue every 5 (five) minutes as needed for chest pain.    omeprazole 20 MG capsule    Commonly known as: PRILOSEC    Take 20 mg by mouth 2 (two) times daily.    phenazopyridine 100 MG tablet    Commonly known as: PYRIDIUM    Take 100 mg by mouth 3 (three) times daily with meals.    senna-docusate 8.6-50 MG per tablet    Commonly known as: Senokot-S    Take 1 tablet by mouth daily.    tamsulosin 0.4 MG Caps capsule    Commonly known as: FLOMAX    Take 0.4 mg by mouth daily after supper.    tobramycin 0.3 % ophthalmic solution    Commonly known as: TOBREX    Place 1 drop into the left eye 4 (four) times daily. For 2 weeks following surgery. Starting 01/24/2014    traMADol 50 MG tablet    Commonly known as: ULTRAM    Take 50 mg by mouth See admin instructions. 50 mg every AM and every 8  hours as needed for  severe pain    zolpidem 5 MG tablet    Commonly known as: AMBIEN    Take 2.5 mg by mouth at bedtime as needed for sleep.  Of note lisinopril has been discontinued     No orders of the defined types were placed in this encounter.  Immunization History   Administered  Date(s) Administered   .  Influenza Whole  03/21/2013   .  Pneumococcal Polysaccharide-23  02/03/2014    History   Substance Use Topics   .  Smoking status:  Never Smoker   .  Smokeless tobacco:  Not on file   .  Alcohol Use:  Yes      Comment: wine occasionally   Family history is noncontributory  Review of Systems  DATA OBTAINED: from patient-nursing staff GENERAL:  no fevers, fatigue, appetite changes  SKIN: No itching, rash or wounds  EYES: No eye pain, redness, discharge  EARS: No earache, tinnitus, change in hearing  NOSE: No congestion, drainage or bleeding  MOUTH/THROAT: No mouth or tooth pain, No sore throat  RESPIRATORY: No cough, wheezing, i or increased shortness of breath CARDIAC: No chest pain, palpitations, lower extremity edema  GI: No abdominal pain, No N/V/D occasional constipation, No heartburn or reflux-- history of GErD  GU: No dysuria, frequency or urgency, or incontinence  MUSCULOSKELETAL: Complains of right thorax pain as noted above NEUROLOGIC:nol headache, no dizziness or focal weakness  PSYCHIATRIC: No overt anxiety or sadness. Sleeps well.--He is on low-dose Ambien                     Physical Exam  His afebrile pulse 96 respirations 19 blood pressure 129/61 GENERAL APPEARANCE: Alert, conversant. Appropriately groomed. No acute distress.  SKIN: No diaphoresis rash  HEAD: Normocephalic, atraumatic  EYES: Conjunctiva/lids clear. Pupils round, reactive. EOMs intact.  EARS: External exam WNL, canals clear. Hearing grossly normal.  NOSE: No deformity or discharge.  MOUTH/THROAT: Lips w/o lesions oropharynx clear mucous  membranes moist  RESPIRATORY: Breathing is even, unlabored. Lung sounds are clear   CARDIOVASCULAR: Heart RRR no murmurs, rubs or gallops. No peripheral edema.  GASTROINTESTINAL: Abdomen is soft, non-tender, not distended w/ normal bowel sounds    MUSCULOSKELETAL: No abnormal joints or musculature does ambulate about the facility in his walker There is  mild tenderness to palpation over his lower right thorax in the area of the rib cage I do not note any deformities- s NEUROLOGIC: Cranial nerves 2-12 grossly intact. Moves all extremities no tremor.--I did not appreciate any lateralizing findings  PSYCHIATRIC: Mood and affect appropriate to situation, no behavioral issues Has anxiety at times Patient Active Problem List    Diagnosis  Date Noted   .  Chest pain  02/08/2014   .  Bradycardia  02/03/2014   .  Near syncope  02/01/2014   .  Hypotension  02/01/2014   .  Dementia with behavioral disturbance  01/24/2014   .  AAA (abdominal aortic aneurysm) without rupture  10/19/2013   .  Pneumonia  09/28/2013   .  Bipolar disorder, unspecified  02/04/2013   .  GERD (gastroesophageal reflux disease)  02/04/2013   .  Hyperglycemia  12/13/2012   .  Unspecified constipation  10/22/2012   .  Pure hypercholesterolemia  10/22/2012   .  Essential hypertension, benign  10/22/2012   .  Allergic rhinitis, cause unspecified  10/22/2012   Labs.  01/05/2015.  LDL 63-triglycerides 117-HDL 33-cholesterol total  119.  The UVC 5.5 hemoglobin 11.5 platelets 189.  Sodium 138 potassium 4 BUN 21 creatinine 1.0.  Hemoglobin A1c 4.6  11/22/2014.  WBC 5.8 hemoglobin 11.3 platelets 167.  Sodium 137 potassium 4.6 BUN 24 creatinine 1.1-CO2 level XXVI.  Liver function tests within normal limits except albumen of 3.4.  TSH-2.33.    06/30/2014.  WBC 7.2 hemoglobin 11.0 platelets 184.  Hemoglobin A1c 5.4.  Cholesterol 1:15 HDL 45 LDL 58  triglycerides 61.  Sodium 138 potassium 4.2 BUN 29 creatinine 1.0-liver function tests within normal limits of note albumin was 3.8  03/17/2014.  TSH-3.33.  03/20/2014.  Sodium 139 potassium 4.4 BUN 20 creatinine 1.0.  Liver function tests within normal limits of note albumin is 4.1 WBC 6.7 hemoglobin 12.2 platelets 194.    03/20/2014.  WBC 6.7 hemoglobin 12.2 platelets 194 . Sodium 139 potassium 4.4 BUN 20 creatinine 1.0  .TSH3.33    Component  Value  Date/Time    WBC  6.4  02/02/2014 0002    RBC  3.53*  02/02/2014 0002    HGB  11.8*  02/02/2014 0002    HCT  35.3*  02/02/2014 0002    PLT  176  02/02/2014 0002    MCV  100.0  02/02/2014 0002    LYMPHSABS  2.5  12/17/2010 0445    MONOABS  0.4  12/17/2010 0445    EOSABS  0.4  12/17/2010 0445    BASOSABS  0.0  12/17/2010 0445   CMP    Component  Value  Date/Time    NA  141  02/03/2014 0410    K  4.4  02/03/2014 0410    CL  103  02/03/2014 0410    CO2  26  02/03/2014 0410    GLUCOSE  87  02/03/2014 0410    BUN  18  02/03/2014 0410    CREATININE  1.05  02/03/2014 0410    CALCIUM  9.4  02/03/2014 0410    PROT  6.6  12/16/2010 1220    ALBUMIN  3.6  12/16/2010 1220    AST  16  12/16/2010 1220    ALT  15  12/16/2010 1220    ALKPHOS  77  12/16/2010 1220    BILITOT  0.3  12/16/2010 1220    GFRNONAA  63*  02/03/2014 0410    GFRAA  73*  02/03/2014 0410   Assessment and Plan       Bradycardia  Nodal blocking agentsdis continue per cardiology --appears stable recent pulses largely in the 70s -- was somewhat excited today and I suspect this whye pulse was in the high 90s  During  hospitalization....  - Cardiac enzymes serially neg  - D-dimer was elevated with follow up CTA neg for PE  - 2-D echo done, and was unremarkable  Appears pulses run largely in the 70s lately  -,  Chest pain  -Serial trop neg  -2D echo done,  unremarkable results  -D-dimer was mildly elevated with f/u CTA chest neg for PE  BNP on admit To hospital of 293.7 and chest x-ray  consistent with scarring There've been no further episodes for some time   Essential hypertension, benign  Metroprolol been discontinued as well as of teh LisinoprilL  -This point monitor-appears to be relatively stable as noted above   AAA (abdominal aortic aneurysm) without rupture  he is followed by cardiology, outpatient -- . Recent ultrasound showed 3.25-- 3.4 cm- -denies abd pain --ultrasound was done a year ago in May-contactcardiology  to inquire about follow-up  Dementia with behavioral disturbance  Continue razadyne and ativan--this appears relatively stable--He is followed by psychiatric services   Bipolar disorder, unspecified  invega, lomictal --this appears stable as well  Pure hypercholesterolimia-lipid panel  Lipid panel in July 2016 unremarkable with an LDL 63  Patient's hemoglobin 11.5 is  relatively stable-will update-also obtain a BMP with history of hypertension.  Right thorax--rib pain-physical exam was not overly concerning will check a chest x-ray.  Allergic rhinitis-continues on Claritin this appears to be stable currently.  Gerd--he continues on Prilosec twice a day this appears to be stable as well.  Eye issues-he is followed by ophthalmology he continues on numerous topical drops-this appears to be relatively stable  [id number]       .

## 2015-03-14 ENCOUNTER — Other Ambulatory Visit: Payer: Self-pay

## 2015-03-14 MED ORDER — ZOLPIDEM TARTRATE 5 MG PO TABS: 5.0000 mg | ORAL_TABLET | Freq: Every evening | ORAL | Status: DC | PRN

## 2015-03-14 NOTE — Telephone Encounter (Signed)
Faxed to [facility] Fax Number: [phone], Phone Number [phone]  

## 2015-03-29 ENCOUNTER — Non-Acute Institutional Stay (SKILLED_NURSING_FACILITY): Payer: Medicare Other | Admitting: Internal Medicine

## 2015-03-29 DIAGNOSIS — I714 Abdominal aortic aneurysm, without rupture, unspecified: Secondary | ICD-10-CM

## 2015-03-29 DIAGNOSIS — F3178 Bipolar disorder, in full remission, most recent episode mixed: Secondary | ICD-10-CM | POA: Diagnosis not present

## 2015-03-29 DIAGNOSIS — R05 Cough: Secondary | ICD-10-CM | POA: Diagnosis not present

## 2015-03-29 DIAGNOSIS — R059 Cough, unspecified: Secondary | ICD-10-CM

## 2015-03-29 DIAGNOSIS — K219 Gastro-esophageal reflux disease without esophagitis: Secondary | ICD-10-CM | POA: Diagnosis not present

## 2015-03-29 DIAGNOSIS — F03918 Unspecified dementia, unspecified severity, with other behavioral disturbance: Secondary | ICD-10-CM

## 2015-03-29 DIAGNOSIS — F0391 Unspecified dementia with behavioral disturbance: Secondary | ICD-10-CM

## 2015-03-29 NOTE — Progress Notes (Signed)
Patient ID: Andre Marsh, male   DOB: Aug 31, 1926, 81 y.o.   MRN: [id number]       this is an acute visit.  Level care skilled.  Facility [facility].     Chief complaint-acute visit secondary to cough--medical management of chronic medical issues   History of present illness.  Patient is a pleasant 81year-old male with appears to be relatively stable-although he remains somewhat anxious  Today' complaining of a cough followed is not really complaining of shortness of breath  Fever or chills  When I saw him last autumn he did complain of intermittent headache --a CT scan was considered--nonetheless he does not really complain of headache today he has been at his baseline-neurologically does not have any complaints and apparently has not had for some time--apparently he has refused the CT scan.    t --   He's also followed by ophthalmology he is status post cataract surgery and receiving topical eyedrops currently.  He was also seen  by cardiology decreased his lisinopril dose and discontinued his Metroprolol-he does have some history of bradycardia as well as low blood pressure- Couple months ago I discontinued his lisinopril secondary to some continued low blood pressures-his appears to have stabilized with blood pressures  106/57-146/74-137/70    .  Patient was also seen by psychiatric services for a significant history of dementia as well as bipolar disorder and anxiety this appears to be relatively stable although he does appear anxious at times-he continues on Ativan when necessary-he is also on Lamictal twice a day as well as galantamine and Invega QHS   -he was hospitalizedabout a year ago for chest pain-no acute etiology was found he was found to be bradycardic and adjustments were made to his beta blocker and he is followed by cardiology-again cardiology has discontinued his Metroprolol  Other than the cough patient has no complaints today does not complain of any  chest pain dizziness headache or syncopal-type feelings  Patient does have a history of an abdominal aortic and resume and an updated ultrasound is pending this is followed by vascular cardiology     \       .  Past Medical History   Diagnosis  Date   .  Bipolar 1 disorder    .  Hypertension    .  Anxiety    .  Paralysis agitans    .  Alzheimer disease    .  Depression    .  Fall    .  GERD (gastroesophageal reflux disease)    .  Cataract      bilateral   .  AAA (abdominal aortic aneurysm)    .  CAD (coronary artery disease)      a. patient reported nonobstructive CAD. b. patient reported cath 2014 @ [facility]   .  Hyperlipidemia     Past Surgical History   Procedure  Laterality  Date   .  Cataract extraction  Left       Medication List                    acetaminophen 325 MG tablet    Commonly known as: TYLENOL    Take 650 mg by mouth every 4 (four) hours as needed for mild pain.    aspirin 325 MG EC tablet    Take 325 mg by mouth daily.    atorvastatin 20 MG tablet    Commonly known as: LIPITOR    Take 1  tablet (20 mg total) by mouth daily at 6 PM.    budesonide 3 MG 24 hr capsule    Commonly known as: ENTOCORT EC    Take 9 mg by mouth every morning.    docusate sodium 100 MG capsule    Commonly known as: COLACE    Take 100 mg by mouth daily.    DUREZOL 0.05 % Emul    Generic drug: Difluprednate    Place 1 drop into the left eye 2 (two) times daily. For 1 week, starting 8/19.    ENSURE    Take 237 mLs by mouth at bedtime. With snack    folic acid 1 MG tablet    Commonly known as: FOLVITE    Take 1 mg by mouth daily.    galantamine 8 MG 24 hr capsule    Commonly known as: RAZADYNE ER    Take 8 mg by mouth daily with breakfast.    ILEVRO 0.3 % Susp    Generic drug: Nepafenac    Place 1 drop into the left eye at bedtime. For 4 weeks, starting  01/24/2014    INVEGA 3 MG 24 hr tablet    Generic drug: paliperidone    Take 3 mg by mouth at bedtime. For paranoia and mood    isosorbide mononitrate 30 MG 24 hr tablet    Commonly known as: IMDUR    Take 30 mg by mouth daily.    lamoTRIgine 100 MG tablet    Commonly known as: LAMICTAL    Take 100 mg by mouth 2 (two) times daily.    lisinopril 10 MG tablet    Commonly known as: PRINIVIL,ZESTRIL    Take 1 tablet (10 mg total) by mouth daily.    loratadine 10 MG tablet    Commonly known as: CLARITIN    Take 10 mg by mouth daily.    LORazepam 0.5 MG tablet    Commonly known as: ATIVAN    Take 0.5 mg by mouth every 12 (twelve) hours as needed for anxiety.    nitroGLYCERIN 0.6 MG SL tablet    Commonly known as: NITROSTAT    Place 0.6 mg under the tongue every 5 (five) minutes as needed for chest pain.    omeprazole 20 MG capsule    Commonly known as: PRILOSEC    Take 20 mg by mouth 2 (two) times daily.    phenazopyridine 100 MG tablet    Commonly known as: PYRIDIUM    Take 100 mg by mouth 3 (three) times daily with meals.    senna-docusate 8.6-50 MG per tablet    Commonly known as: Senokot-S    Take 1 tablet by mouth daily.    tamsulosin 0.4 MG Caps capsule    Commonly known as: FLOMAX    Take 0.4 mg by mouth daily after supper.    tobramycin 0.3 % ophthalmic solution    Commonly known as: TOBREX    Place 1 drop into the left eye 4 (four) times daily. For 2 weeks following surgery. Starting 01/24/2014    traMADol 50 MG tablet    Commonly known as: ULTRAM    Take 50 mg by mouth See admin instructions. 50 mg every AM and every 8 hours as needed for severe pain    zolpidem 5 MG tablet    Commonly known as: AMBIEN    Take 2.5 mg by mouth at bedtime as needed for sleep.  Of note lisinopril has been discontinued  No orders of the defined types were placed in this encounter.  Immunization History   Administered  Date(s)  Administered   .  Influenza Whole  03/21/2013   .  Pneumococcal Polysaccharide-23  02/03/2014    History   Substance Use Topics   .  Smoking status:  Never Smoker   .  Smokeless tobacco:  Not on file   .  Alcohol Use:  Yes      Comment: wine occasionally   Family history is noncontributory  Review of Systems  DATA OBTAINED: from patient-nursing staff GENERAL:  no fevers, fatigue, appetite changes  SKIN: No itching, rash or wounds  EYES: No eye pain, redness, discharge  EARS: No earache, tinnitus, change in hearing  NOSE: No congestion, drainage or bleeding  MOUTH/THROAT: No mouth or tooth pain, No sore throat  RESPIRATORY: has a  Cough,  No  wheezing, i or increased shortness of breath CARDIAC: No chest pain, palpitations, lower extremity edema  GI: No abdominal pain, No N/V/D occasional constipation, No heartburn or reflux-- history of GErD  GU: No dysuria, frequency or urgency, or incontinence  MUSCULOSKELETAL: Does not really complain of joint pain today NEUROLOGIC:nol headache, no dizziness or focal weakness  PSYCHIATRIC: No overt anxiety or sadness. Sleeps well.--He is on low-dose Ambien                     Physical Exam  Temperature 97.5 pulse 75 respirations 18 blood pressure 106/57 GENERAL APPEARANCE: Alert, conversant. Appropriately groomed. No acute distress.  SKIN: No diaphoresis rash  HEAD: Normocephalic, atraumatic  EYES: Conjunctiva/lids clear. Pupils round, reactive. EOMs intact.  EARS: External exam WNL, canals clear. Hearing grossly normal.  NOSE: No deformity or discharge.  MOUTH/THROAT: Lips w/o lesions oropharynx clear mucous membranes moist  RESPIRATORY: Breathing is even, unlabored. Lung sounds are clear   CARDIOVASCULAR: Heart RRR no murmurs, rubs or gallops. No peripheral edema.  GASTROINTESTINAL: Abdomen is soft, non-tender, not distended w/ normal bowel sounds    MUSCULOSKELETAL: No  abnormal joints or musculature does ambulate about the facility in his walker  - s NEUROLOGIC: Cranial nerves 2-12 grossly intact. Moves all extremities no tremor.--I did not appreciate any lateralizing findings  PSYCHIATRIC: Mood and affect appropriate to situation, no behavioral issues Has anxiety at times Patient Active Problem List    Diagnosis  Date Noted   .  Chest pain  02/08/2014   .  Bradycardia  02/03/2014   .  Near syncope  02/01/2014   .  Hypotension  02/01/2014   .  Dementia with behavioral disturbance  01/24/2014   .  AAA (abdominal aortic aneurysm) without rupture  10/19/2013   .  Pneumonia  09/28/2013   .  Bipolar disorder, unspecified  02/04/2013   .  GERD (gastroesophageal reflux disease)  02/04/2013   .  Hyperglycemia  12/13/2012   .  Unspecified constipation  10/22/2012   .  Pure hypercholesterolemia  10/22/2012   .  Essential hypertension, benign  10/22/2012   .  Allergic rhinitis, cause unspecified  10/22/2012   Labs.  01/05/2015.  LDL 63-triglycerides 117-HDL 33-cholesterol total 119.  The UVC 5.5 hemoglobin 11.5 platelets 189.  Sodium 138 potassium 4 BUN 21 creatinine 1.0.  Hemoglobin A1c 4.6  11/22/2014.  WBC 5.8 hemoglobin 11.3 platelets 167.  Sodium 137 potassium 4.6 BUN 24 creatinine 1.1-CO2 level XXVI.  Liver function tests within normal limits except albumen of 3.4.  TSH-2.33.    06/30/2014.  WBC 7.2 hemoglobin  11.0 platelets 184.  Hemoglobin A1c 5.4.  Cholesterol 1:15 HDL 45 LDL 58 triglycerides 61.  Sodium 138 potassium 4.2 BUN 29 creatinine 1.0-liver function tests within normal limits of note albumin was 3.8  03/17/2014.  TSH-3.33.  03/20/2014.  Sodium 139 potassium 4.4 BUN 20 creatinine 1.0.  Liver function tests within normal limits of note albumin is 4.1 WBC 6.7 hemoglobin 12.2 platelets 194.    03/20/2014.  WBC 6.7 hemoglobin 12.2 platelets 194 . Sodium 139  potassium 4.4 BUN 20 creatinine 1.0  .TSH3.33    Component  Value  Date/Time    WBC  6.4  02/02/2014 0002    RBC  3.53*  02/02/2014 0002    HGB  11.8*  02/02/2014 0002    HCT  35.3*  02/02/2014 0002    PLT  176  02/02/2014 0002    MCV  100.0  02/02/2014 0002    LYMPHSABS  2.5  12/17/2010 0445    MONOABS  0.4  12/17/2010 0445    EOSABS  0.4  12/17/2010 0445    BASOSABS  0.0  12/17/2010 0445   CMP    Component  Value  Date/Time    NA  141  02/03/2014 0410    K  4.4  02/03/2014 0410    CL  103  02/03/2014 0410    CO2  26  02/03/2014 0410    GLUCOSE  87  02/03/2014 0410    BUN  18  02/03/2014 0410    CREATININE  1.05  02/03/2014 0410    CALCIUM  9.4  02/03/2014 0410    PROT  6.6  12/16/2010 1220    ALBUMIN  3.6  12/16/2010 1220    AST  16  12/16/2010 1220    ALT  15  12/16/2010 1220    ALKPHOS  77  12/16/2010 1220    BILITOT  0.3  12/16/2010 1220    GFRNONAA  63*  02/03/2014 0410    GFRAA  73*  02/03/2014 0410   Assessment and Plan   Cough-clinically patient appears stable will add Mucinex 600 mg twice a day for 7 days monitor vital signs she shift for 72 hours as well as obtain a chest x-ray to rule out anything more acute.    Bradycardia  Nodal blocking agentsdiscontinued  per cardiology --appears stable recent pulses largely in the 70s -- was somewhat excited today and I suspect this whye pulse was in the high 90s  During  hospitalization....  - Cardiac enzymes serially neg  - D-dimer was elevated with follow up CTA neg for PE  - 2-D echo done, and was unremarkable   -,  Chest pain  -Serial trop neg  -2D echo done, unremarkable results  -D-dimer was mildly elevated with f/u CTA chest neg for PE  BNP on admit To hospital of 293.7 and chest x-ray  consistent with scarring There've been no further episodes for some time   Essential hypertension, benign  Metroprolol been  discontinued as well as  LisinoprilL  -This point monitor-appears to be relatively stable as noted above   AAA (abdominal aortic aneurysm) without rupture  he is followed by cardiology, outpatient -- . Recent ultrasound showed 3.25-- 3.4 cm- -denies abd pain --ultrasound was done a year ago in May-cardiology has arranged a follow-up this is pending for later this month  Dementia with behavioral disturbance  Continue razadyne and ativan--this appears relatively stable--He is followed by psychiatric services   Bipolar disorder, unspecified  invega, lomictal --  this appears stable as well  Pure hypercholesterolimia-lipid panel  Lipid panel in July 2016 unremarkable with an LDL 63  Patient's hemoglobin 11.5 is  relatively stable-will update-also obtain a BMP with history of hypertension. .  Allergic rhinitis-continues on Claritin this appears to be stable currently.  Gerd--he continues on Prilosec twice a day this appears to be stable as well.  Eye issues-he is followed by ophthalmology he continues on numerous topical drops-this appears to be relatively stable  [id number]       .

## 2015-04-02 ENCOUNTER — Non-Acute Institutional Stay (SKILLED_NURSING_FACILITY): Payer: Medicare Other | Admitting: Internal Medicine

## 2015-04-02 DIAGNOSIS — R079 Chest pain, unspecified: Secondary | ICD-10-CM

## 2015-04-02 NOTE — Progress Notes (Signed)
Patient ID: Andre Marsh, male   DOB: 03-19-1927, 81 y.o.   MRN: [id number]        this is an acute visit.  Level care skilled.  Facility [facility].     Chief complaint-acute visit secondary to chest pain   History of present illness.  Patient is a pleasant 81year-old male who is complaining of midsternal chest pain-as well as some left arm numbness.  Apparently this started this morning-he does not complain of any shortness of breath nursing did give him nitroglycerin 3 but he states this has not relieved the chest pain      .     -he was hospitalizedabout a year ago for chest pain-no acute etiology was found he was found to be bradycardic and adjustments were made to his this --beta blocker been discontinued and he is followed by cardiology-  During the workup during that hospitalization cardiac echo appears to have been normal as well as troponins     Patient does have a history of an abdominal aortic and resume and an updated ultrasound is pending this is followed by  Cardiology  As well.  He is not really complaining of any abdominal or back discomfort this appears to be more midsternal chest pain and left arm numbness.  .       \       .  Past Medical History   Diagnosis  Date   .  Bipolar 1 disorder    .  Hypertension    .  Anxiety    .  Paralysis agitans    .  Alzheimer disease    .  Depression    .  Fall    .  GERD (gastroesophageal reflux disease)    .  Cataract      bilateral   .  AAA (abdominal aortic aneurysm)    .  CAD (coronary artery disease)      a. patient reported nonobstructive CAD. b. patient reported cath 2014 @ [facility]   .  Hyperlipidemia     Past Surgical History   Procedure  Laterality  Date   .  Cataract extraction  Left       Medication List                    acetaminophen 325 MG tablet    Commonly known as: TYLENOL    Take 650 mg  by mouth every 4 (four) hours as needed for mild pain.    aspirin 325 MG EC tablet    Take 325 mg by mouth daily.    atorvastatin 20 MG tablet    Commonly known as: LIPITOR    Take 1 tablet (20 mg total) by mouth daily at 6 PM.    budesonide 3 MG 24 hr capsule    Commonly known as: ENTOCORT EC    Take 9 mg by mouth every morning.    docusate sodium 100 MG capsule    Commonly known as: COLACE    Take 100 mg by mouth daily.    DUREZOL 0.05 % Emul    Generic drug: Difluprednate    Place 1 drop into the left eye 2 (two) times daily. For 1 week, starting 8/19.    ENSURE    Take 237 mLs by mouth at bedtime. With snack    folic acid 1 MG tablet    Commonly known as: FOLVITE    Take 1 mg by mouth daily.  galantamine 8 MG 24 hr capsule    Commonly known as: RAZADYNE ER    Take 8 mg by mouth daily with breakfast.    ILEVRO 0.3 % Susp    Generic drug: Nepafenac    Place 1 drop into the left eye at bedtime. For 4 weeks, starting 01/24/2014    INVEGA 3 MG 24 hr tablet    Generic drug: paliperidone    Take 3 mg by mouth at bedtime. For paranoia and mood    isosorbide mononitrate 30 MG 24 hr tablet    Commonly known as: IMDUR    Take 30 mg by mouth daily.    lamoTRIgine 100 MG tablet    Commonly known as: LAMICTAL    Take 100 mg by mouth 2 (two) times daily.    lisinopril 10 MG tablet    Commonly known as: PRINIVIL,ZESTRIL    Take 1 tablet (10 mg total) by mouth daily.    loratadine 10 MG tablet    Commonly known as: CLARITIN    Take 10 mg by mouth daily.    LORazepam 0.5 MG tablet    Commonly known as: ATIVAN    Take 0.5 mg by mouth every 12 (twelve) hours as needed for anxiety.    nitroGLYCERIN 0.6 MG SL tablet    Commonly known as: NITROSTAT    Place 0.6 mg under the tongue every 5 (five) minutes as needed for chest pain.    omeprazole 20 MG capsule    Commonly known as: PRILOSEC    Take 20 mg by mouth 2 (two)  times daily.    phenazopyridine 100 MG tablet    Commonly known as: PYRIDIUM    Take 100 mg by mouth 3 (three) times daily with meals.    senna-docusate 8.6-50 MG per tablet    Commonly known as: Senokot-S    Take 1 tablet by mouth daily.    tamsulosin 0.4 MG Caps capsule    Commonly known as: FLOMAX    Take 0.4 mg by mouth daily after supper.    tobramycin 0.3 % ophthalmic solution    Commonly known as: TOBREX    Place 1 drop into the left eye 4 (four) times daily. For 2 weeks following surgery. Starting 01/24/2014    traMADol 50 MG tablet    Commonly known as: ULTRAM    Take 50 mg by mouth See admin instructions. 50 mg every AM and every 8 hours as needed for severe pain    zolpidem 5 MG tablet    Commonly known as: AMBIEN    Take 2.5 mg by mouth at bedtime as needed for sleep.  Of note lisinopril has been discontinued     No orders of the defined types were placed in this encounter.  Immunization History   Administered  Date(s) Administered   .  Influenza Whole  03/21/2013   .  Pneumococcal Polysaccharide-23  02/03/2014    History   Substance Use Topics   .  Smoking status:  Never Smoker   .  Smokeless tobacco:  Not on file   .  Alcohol Use:  Yes      Comment: wine occasionally   Family history is noncontributory  Review of Systems  DATA OBTAINED: from patient-nursing staff GENERAL:  no fevers, fatigue, appetite changes  SKIN: No itching, rash or wounds or diaphoresis  EYES: No eye pain, redness, discharge  EARS: No earache, tinnitus, change in hearing  NOSE: No congestion, drainage or bleeding  MOUTH/THROAT: No mouth or tooth pain, No sore throat  RESPIRATORY: ,  No  wheezing,  or increased shortness of breath CARDIAC: has chest pain--- no, palpitations, lower extremity edema  GI: No abdominal pain, No N/V/D occasional constipation, No heartburn or reflux-- history of GErD  GU: No dysuria, frequency or  urgency, or incontinence  MUSCULOSKELETAL: Does not really complain of joint pain today--is complaining of some left arm numbness NEUROLOGIC:nol headache, no dizziness or focal weakness  PSYCHIATRIC: No overt anxiety or sadness. Sleeps well.--He is on low-dose Ambien                     Physical Exam  Temperature 97.1 pulse 84 respirations 20 blood pressure 123/71 O2 saturation 96% on room air GENERAL APPEARANCE: Alert, conversant. Appropriately groomed. No acute distress.  SKIN: No diaphoresis rash  HEAD: Normocephalic, atraumatic  EYES: Conjunctiva/lids clear. Pupils round, reactive. EOMs intact.  EARS: E. Hearing grossly normal.  NOSE: No deformity or discharge.  MOUTH/THROAT: Lips w/o lesions oropharynx clear mucous membranes moist  RESPIRATORY: Breathing is even, unlabored. Lung sounds are clear   CARDIOVASCULAR: Heart RRR with distant heart sounds no murmurs, rubs or gallops. No peripheral edema.  GASTROINTESTINAL: Abdomen is soft, non-tender, not distended w/ normal bowel sounds    MUSCULOSKELETAL: No abnormal joints or musculature does ambulate about the facility in his walker-initially lying in bed but before EMS arrived --I noted he had gotten up was changing his clothes  - s NEUROLOGIC: Cranial nerves 2-12 grossly intact. Moves all extremities no tremor.--I did not appreciate any lateralizing findings  PSYCHIATRIC: Mood and affect appropriate to situation, no behavioral issues Has anxiety at times Patient Active Problem List    Diagnosis  Date Noted   .  Chest pain  02/08/2014   .  Bradycardia  02/03/2014   .  Near syncope  02/01/2014   .  Hypotension  02/01/2014   .  Dementia with behavioral disturbance  01/24/2014   .  AAA (abdominal aortic aneurysm) without rupture  10/19/2013   .  Pneumonia  09/28/2013   .  Bipolar disorder, unspecified  02/04/2013   .  GERD (gastroesophageal reflux disease)  02/04/2013    .  Hyperglycemia  12/13/2012   .  Unspecified constipation  10/22/2012   .  Pure hypercholesterolemia  10/22/2012   .  Essential hypertension, benign  10/22/2012   .  Allergic rhinitis, cause unspecified  10/22/2012   Labs.  03/30/2015.  Sodium 139 potassium 4.2 BUN 24 creatinine 1.0.  Albumin 3.4.  Otherwise liver function tests within normal limits.  WBC 6.3 hemoglobin 10.9 platelets 151  01/05/2015.  LDL 63-triglycerides 117-HDL 33-cholesterol total 119.  WBC 5.5 hemoglobin 11.5 platelets 189.  Sodium 138 potassium 4 BUN 21 creatinine 1.0.  Hemoglobin A1c 4.6  11/22/2014.  WBC 5.8 hemoglobin 11.3 platelets 167.  Sodium 137 potassium 4.6 BUN 24 creatinine 1.1-CO2 level XXVI.  Liver function tests within normal limits except albumen of 3.4.  TSH-2.33.    06/30/2014.  WBC 7.2 hemoglobin 11.0 platelets 184.  Hemoglobin A1c 5.4.  Cholesterol 1:15 HDL 45 LDL 58 triglycerides 61.  Sodium 138 potassium 4.2 BUN 29 creatinine 1.0-liver function tests within normal limits of note albumin was 3.8  03/17/2014.  TSH-3.33.  03/20/2014.  Sodium 139 potassium 4.4 BUN 20 creatinine 1.0.  Liver function tests within normal limits of note albumin is 4.1 WBC 6.7 hemoglobin 12.2 platelets 194.    03/20/2014.  WBC 6.7 hemoglobin 12.2 platelets  194 . Sodium 139 potassium 4.4 BUN 20 creatinine 1.0  .TSH3.33    Component  Value  Date/Time    WBC  6.4  02/02/2014 0002    RBC  3.53*  02/02/2014 0002    HGB  11.8*  02/02/2014 0002    HCT  35.3*  02/02/2014 0002    PLT  176  02/02/2014 0002    MCV  100.0  02/02/2014 0002    LYMPHSABS  2.5  12/17/2010 0445    MONOABS  0.4  12/17/2010 0445    EOSABS  0.4  12/17/2010 0445    BASOSABS  0.0  12/17/2010 0445   CMP    Component  Value  Date/Time    NA  141  02/03/2014 0410    K  4.4  02/03/2014 0410    CL  103  02/03/2014 0410    CO2  26  02/03/2014  0410    GLUCOSE  87  02/03/2014 0410    BUN  18  02/03/2014 0410    CREATININE  1.05  02/03/2014 0410    CALCIUM  9.4  02/03/2014 0410    PROT  6.6  12/16/2010 1220    ALBUMIN  3.6  12/16/2010 1220    AST  16  12/16/2010 1220    ALT  15  12/16/2010 1220    ALKPHOS  77  12/16/2010 1220    BILITOT  0.3  12/16/2010 1220    GFRNONAA  63*  02/03/2014 0410    GFRAA  73*  02/03/2014 0410   Assessment and Plan    Chest pain unresolved with nitroglycerin with associated left arm numbness-will send patient to the ER for urgent evaluation rule out MI or acute cardiac process-he does not appear to be in any distress his vital signs are stable per serial exams before EMS arrived he appeared to be resting comfortably actually was up changing clothes at one point- However he does continue to complain of chest pain and left arm numbness.  [id number]   .         Marland Kitchen

## 2015-04-08 ENCOUNTER — Encounter: Payer: Self-pay | Admitting: Internal Medicine

## 2015-04-08 DIAGNOSIS — R059 Cough, unspecified: Secondary | ICD-10-CM | POA: Insufficient documentation

## 2015-04-08 DIAGNOSIS — R05 Cough: Secondary | ICD-10-CM | POA: Insufficient documentation

## 2015-04-10 ENCOUNTER — Encounter: Payer: Self-pay | Admitting: Internal Medicine

## 2015-04-10 NOTE — Progress Notes (Signed)
MRN: [id number] Name: Andre Marsh  Sex: male Age: 81 y.o. DOB: 01/22/1927  PSC #:  Facility/Room: Level Of Care: SNF Provider: Merrilee SeashoreALEXANDER, Sanita Estrada D Emergency Contacts: Extended Emergency Contact Information Primary Emergency Contact: [name],[name] Address: [address]          [place] [id number] [place] Home Phone: [phone] Mobile Phone: [phone] Relation: Daughter  Code Status:   Allergies: Ciprofloxacin hcl; Cocoa; Codeine; Hydrocodone; Librax; Loperamide hcl; Metaxalone; Penicillins; Sulfa antibiotics; and Tetracyclines & related  Chief Complaint  Patient presents with  . Readmit To SNF    HPI: Patient is 81 y.o. male who  Past Medical History  Diagnosis Date  . Bipolar 1 disorder (HCC)   . Hypertension   . Anxiety   . Paralysis agitans (HCC)   . Alzheimer disease   . Depression   . Fall   . GERD (gastroesophageal reflux disease)   . Cataract     bilateral  . AAA (abdominal aortic aneurysm) (HCC)   . CAD (coronary artery disease)     a. patient reported nonobstructive CAD. b. patient reported cath 2014 @ [facility]  . Hyperlipidemia     Past Surgical History  Procedure Laterality Date  . Cataract extraction Left       Medication List       This list is accurate as of: 04/10/15  5:10 PM.  Always use your most recent med list.               acetaminophen 325 MG tablet  Commonly known as:  TYLENOL  Take 650 mg by mouth every 4 (four) hours as needed for mild pain.     aspirin 325 MG EC tablet  Take 325 mg by mouth daily. For parkinson disease     atorvastatin 20 MG tablet  Commonly known as:  LIPITOR  Take 1 tablet (20 mg total) by mouth daily at 6 PM.     docusate sodium 100 MG capsule  Commonly known as:  COLACE  Take 100 mg by mouth daily. For constipation     ENSURE  Take 237 mLs by mouth at bedtime. With snack     folic acid 1 MG tablet  Commonly known as:  FOLVITE  Take 1 mg by mouth daily.     galantamine 8  MG 24 hr capsule  Commonly known as:  RAZADYNE ER  Take 8 mg by mouth daily with breakfast. For alzheimer's disease     INVEGA 3 MG 24 hr tablet  Generic drug:  paliperidone  Take 3 mg by mouth at bedtime. For paranoia and mood     isosorbide mononitrate 30 MG 24 hr tablet  Commonly known as:  IMDUR  Take 30 mg by mouth daily. For heart disease     lamoTRIgine 100 MG tablet  Commonly known as:  LAMICTAL  Take 100 mg by mouth 2 (two) times daily. For bipolar disorder     loratadine 10 MG tablet  Commonly known as:  CLARITIN  Take 10 mg by mouth daily. For allergies     LORazepam 0.5 MG tablet  Commonly known as:  ATIVAN  Take 1 tablet (0.5 mg total) by mouth every 12 (twelve) hours as needed for anxiety.     nitroGLYCERIN 0.6 MG SL tablet  Commonly known as:  NITROSTAT  Place 0.6 mg under the tongue every 5 (five) minutes as needed for chest pain.     omeprazole 20 MG capsule  Commonly known as:  PRILOSEC  Take 20 mg by mouth 2 (two) times daily. For reflux     phenazopyridine 100 MG tablet  Commonly known as:  PYRIDIUM  Take 100 mg by mouth 3 (three) times daily with meals. For enlarged prostate     senna-docusate 8.6-50 MG tablet  Commonly known as:  Senokot-S  Take 1 tablet by mouth daily.     tamsulosin 0.4 MG Caps capsule  Commonly known as:  FLOMAX  Take 0.4 mg by mouth daily after supper. For enlarged prostate     traMADol 50 MG tablet  Commonly known as:  ULTRAM  1 by mouth every 8 hours as needed for severe pain     zolpidem 5 MG tablet  Commonly known as:  AMBIEN  Take 1 tablet (5 mg total) by mouth at bedtime as needed for sleep.        No orders of the defined types were placed in this encounter.    Immunization History  Administered Date(s) Administered  . Influenza Whole 03/21/2013  . Influenza-Unspecified 04/14/2014  . PPD Test 01/30/2010  . Pneumococcal Polysaccharide-23 02/03/2014    Social History  Substance Use Topics  . Smoking  status: Never Smoker   . Smokeless tobacco: Not on file  . Alcohol Use: Yes     Comment: wine occasionally    Family history is    Review of Systems  DATA OBTAINED: from patient, nurse, medical record, family member GENERAL:  no fevers, fatigue, appetite changes SKIN: No itching, rash or wounds EYES: No eye pain, redness, discharge EARS: No earache, tinnitus, change in hearing NOSE: No congestion, drainage or bleeding  MOUTH/THROAT: No mouth or tooth pain, No sore throat RESPIRATORY: No cough, wheezing, SOB CARDIAC: No chest pain, palpitations, lower extremity edema  GI: No abdominal pain, No N/V/D or constipation, No heartburn or reflux  GU: No dysuria, frequency or urgency, or incontinence  MUSCULOSKELETAL: No unrelieved bone/joint pain NEUROLOGIC: No headache, dizziness or focal weakness PSYCHIATRIC: No c/o anxiety or sadness   Filed Vitals:   04/10/15 1709  BP: 136/83  Pulse: 84  Temp: 97.4 F (36.3 C)  Resp: 16    SpO2 Readings from Last 1 Encounters:  02/03/14 92%        Physical Exam  GENERAL APPEARANCE: Alert, conversant,  No acute distress.  SKIN: No diaphoresis rash HEAD: Normocephalic, atraumatic  EYES: Conjunctiva/lids clear. Pupils round, reactive. EOMs intact.  EARS: External exam WNL, canals clear. Hearing grossly normal.  NOSE: No deformity or discharge.  MOUTH/THROAT: Lips w/o lesions  RESPIRATORY: Breathing is even, unlabored. Lung sounds are clear   CARDIOVASCULAR: Heart RRR no murmurs, rubs or gallops. No peripheral edema.   GASTROINTESTINAL: Abdomen is soft, non-tender, not distended w/ normal bowel sounds. GENITOURINARY: Bladder non tender, not distended  MUSCULOSKELETAL: No abnormal joints or musculature NEUROLOGIC:  Cranial nerves 2-12 grossly intact. Moves all extremities  PSYCHIATRIC: Mood and affect appropriate to situation, no behavioral issues  Patient Active Problem List   Diagnosis Date Noted  . Cough 04/08/2015  . Rib pain  03/01/2015  . SOB (shortness of breath) 01/06/2015  . Schizoaffective disorder (HCC) 11/17/2014  . Back pain 06/29/2014  . Chest pain 02/08/2014  . Bradycardia 02/03/2014  . Near syncope 02/01/2014  . Hypotension 02/01/2014  . Dementia with behavioral disturbance 01/24/2014  . AAA (abdominal aortic aneurysm) without rupture (HCC) 10/19/2013  . Pneumonia 09/28/2013  . Bipolar disorder (HCC) 02/04/2013  . GERD (gastroesophageal reflux disease) 02/04/2013  . Hyperglycemia 12/13/2012  .  Unspecified constipation 10/22/2012  . Pure hypercholesterolemia 10/22/2012  . Essential hypertension, benign 10/22/2012  . Allergic rhinitis 10/22/2012    CBC    Component Value Date/Time   WBC 7.7 08/11/2014   WBC 6.4 02/02/2014 0002   RBC 3.53* 02/02/2014 0002   HGB 11.9* 08/11/2014   HCT 37* 08/11/2014   PLT 182 08/11/2014   MCV 100.0 02/02/2014 0002   LYMPHSABS 2.5 12/17/2010 0445   MONOABS 0.4 12/17/2010 0445   EOSABS 0.4 12/17/2010 0445   BASOSABS 0.0 12/17/2010 0445    CMP     Component Value Date/Time   NA 141 08/11/2014   NA 141 02/03/2014 0410   K 4.6 08/11/2014   CL 103 02/03/2014 0410   CO2 26 02/03/2014 0410   GLUCOSE 87 02/03/2014 0410   BUN 26* 08/11/2014   BUN 18 02/03/2014 0410   CREATININE 1.0 08/11/2014   CREATININE 1.05 02/03/2014 0410   CALCIUM 9.4 02/03/2014 0410   PROT 6.6 12/16/2010 1220   ALBUMIN 3.6 12/16/2010 1220   AST 15 08/11/2014   ALT 17 08/11/2014   ALKPHOS 65 08/11/2014   BILITOT 0.3 12/16/2010 1220   GFRNONAA 63* 02/03/2014 0410   GFRAA 73* 02/03/2014 0410    Lab Results  Component Value Date   HGBA1C 5.4 06/30/2014     Dg Chest 2 View  02/01/2014  CLINICAL DATA:  Near syncope EXAM: CHEST  2 VIEW COMPARISON:  10/19/2012 FINDINGS: Heart size is upper normal. Negative for heart failure. Left lower lobe airspace density similar to the prior study and most consistent with scarring. Negative for pleural effusion. Severe compression fracture  approximately T12 is unchanged. IMPRESSION: Left lower lobe airspace disease is most consistent with scarring. Electronically Signed   By: Marlan Palau M.D.   On: 02/01/2014 13:06   Ct Angio Chest Pe W/cm &/or Wo Cm  02/02/2014  CLINICAL DATA:  Elevated D-dimer, possible PE EXAM: CT ANGIOGRAPHY CHEST WITH CONTRAST TECHNIQUE: Multidetector CT imaging of the chest was performed using the standard protocol during bolus administration of intravenous contrast. Multiplanar CT image reconstructions and MIPs were obtained to evaluate the vascular anatomy. CONTRAST:  OMNIPAQUE IOHEXOL 350 MG/ML SOLN COMPARISON:  None FINDINGS: Sagittal images of the spine shows degenerative changes thoracic spine. Cardiomegaly is noted. Atherosclerotic calcifications of thoracic aorta and coronary arteries. There is moderate size hiatal hernia. The patient is status postcholecystectomy. No pulmonary embolus is noted. The study is of excellent technical quality. No mediastinal hematoma or adenopathy. Images of the lung parenchyma shows no acute infiltrate or pulmonary edema. There is geographic mild parenchymal ground-glass attenuation probable due to hypoventilatory changes. No segmental infiltrate. No pneumothorax. Mild interstitial prominence bilateral lower lobe probable chronic in nature. Review of the MIP images confirms the above findings. IMPRESSION: 1. No pulmonary embolus is noted. 2. Atherosclerotic calcifications of thoracic aorta and coronary arteries. 3. No mediastinal hematoma or adenopathy. 4. No segmental infiltrate or pulmonary edema. Mild patchy ground-glass attenuation bilaterally probable due to hypoventilatory changes. Mild interstitial prominence bilaterally probable chronic in nature. Electronically Signed   By: Natasha Mead M.D.   On: 02/02/2014 12:44    Not all labs, radiology exams or other studies done during hospitalization come through on my EPIC note; however they are reviewed by  me.    Assessment and Plan  No problem-specific assessment & plan notes found for this encounter.   Margit Hanks, MD    This encounter was created in error - please disregard.

## 2015-04-11 ENCOUNTER — Non-Acute Institutional Stay (SKILLED_NURSING_FACILITY): Payer: Medicare Other | Admitting: Internal Medicine

## 2015-04-11 DIAGNOSIS — T148 Other injury of unspecified body region: Secondary | ICD-10-CM

## 2015-04-11 DIAGNOSIS — T148XXA Other injury of unspecified body region, initial encounter: Secondary | ICD-10-CM

## 2015-04-13 NOTE — Progress Notes (Signed)
Patient ID: Andre Marsh, male   DOB: 05/04/1927, 81 y.o.   MRN: [id number]   This is an acute visit.  Level care skilled.  Facility [facility].  Chief complaint acute visit secondary to abrasion right ear.  History of present illness patient is a pleasant elderly resident who states he's had a small amount of bleeding from the back of his right ear he would like me to evaluate it he does not complain of any recent trauma or acute pain.  Physical exam.  Posterior area of his right ear there is a small scratch mark with a slight amount of dried blood there is no sign of infection or tenderness to palpation--there is no surrounding erythema or drainage.  Assessment plan.   small abrasion that had a small amount of bleeding which appears to have stopped-this was discussed with nursing-wound care will follow --this will need monitoring there is no sign of infection but this will have to be watched-I suspect he would benefit from a topical antibiotic   [id number]

## 2015-04-19 ENCOUNTER — Non-Acute Institutional Stay (SKILLED_NURSING_FACILITY): Payer: Medicare Other | Admitting: Internal Medicine

## 2015-04-19 DIAGNOSIS — H6120 Impacted cerumen, unspecified ear: Secondary | ICD-10-CM

## 2015-04-19 DIAGNOSIS — K219 Gastro-esophageal reflux disease without esophagitis: Secondary | ICD-10-CM

## 2015-04-19 NOTE — Progress Notes (Signed)
Patient ID: Andre Marsh, male   DOB: 11-07-1926, 81 y.o.   MRN: [id number]       this is an acute visit.  Level care skilled.  Facility [facility].     Chief complaint- acute visit secondary patient complains of feeling left ear is plugged   History of present illness.   patient is a pleasant 81 year old male who is complaining that his left ear feels plugged be says he thinks he has some wax in it-he does not really complain of any ear pain fever or chills        .       \       .  Past Medical History   Diagnosis  Date   .  Bipolar 1 disorder    .  Hypertension    .  Anxiety    .  Paralysis agitans    .  Alzheimer disease    .  Depression    .  Fall    .  GERD (gastroesophageal reflux disease)    .  Cataract      bilateral   .  AAA (abdominal aortic aneurysm)    .  CAD (coronary artery disease)      a. patient reported nonobstructive CAD. b. patient reported cath 2014 @ [facility]   .  Hyperlipidemia     Past Surgical History   Procedure  Laterality  Date   .  Cataract extraction  Left       Medication List                    acetaminophen 325 MG tablet    Commonly known as: TYLENOL    Take 650 mg by mouth every 4 (four) hours as needed for mild pain.    aspirin 325 MG EC tablet    Take 325 mg by mouth daily.    atorvastatin 20 MG tablet    Commonly known as: LIPITOR    Take 1 tablet (20 mg total) by mouth daily at 6 PM.    budesonide 3 MG 24 hr capsule    Commonly known as: ENTOCORT EC    Take 9 mg by mouth every morning.    docusate sodium 100 MG capsule    Commonly known as: COLACE    Take 100 mg by mouth daily.    DUREZOL 0.05 % Emul    Generic drug: Difluprednate    Place 1 drop into the left eye 2 (two) times daily. For 1 week, starting 8/19.    ENSURE    Take 237 mLs by mouth at bedtime. With snack    folic acid 1 MG tablet    Commonly known as: FOLVITE    Take 1 mg by mouth daily.    galantamine 8 MG 24 hr capsule    Commonly known as: RAZADYNE ER    Take 8 mg by mouth daily with breakfast.    ILEVRO 0.3 % Susp    Generic drug: Nepafenac    Place 1 drop into the left eye at bedtime. For 4 weeks, starting 01/24/2014    INVEGA 3 MG 24 hr tablet    Generic drug: paliperidone    Take 3 mg by mouth at bedtime. For paranoia and mood    isosorbide mononitrate 30 MG 24 hr tablet    Commonly known as: IMDUR    Take 30 mg by mouth daily.    lamoTRIgine 100 MG tablet  Commonly known as: LAMICTAL    Take 100 mg by mouth 2 (two) times daily.    lisinopril 10 MG tablet    Commonly known as: PRINIVIL,ZESTRIL    Take 1 tablet (10 mg total) by mouth daily.    loratadine 10 MG tablet    Commonly known as: CLARITIN    Take 10 mg by mouth daily.    LORazepam 0.5 MG tablet    Commonly known as: ATIVAN    Take 0.5 mg by mouth every 12 (twelve) hours as needed for anxiety.    nitroGLYCERIN 0.6 MG SL tablet    Commonly known as: NITROSTAT    Place 0.6 mg under the tongue every 5 (five) minutes as needed for chest pain.    omeprazole 20 MG capsule    Commonly known as: PRILOSEC    Take 20 mg by mouth 2 (two) times daily.    phenazopyridine 100 MG tablet    Commonly known as: PYRIDIUM    Take 100 mg by mouth 3 (three) times daily with meals.    senna-docusate 8.6-50 MG per tablet    Commonly known as: Senokot-S    Take 1 tablet by mouth daily.    tamsulosin 0.4 MG Caps capsule    Commonly known as: FLOMAX    Take 0.4 mg by mouth daily after supper.    tobramycin 0.3 % ophthalmic solution    Commonly known as: TOBREX    Place 1 drop into the left eye 4 (four) times daily. For 2 weeks following surgery. Starting 01/24/2014    traMADol 50 MG tablet    Commonly known as: ULTRAM    Take 50 mg by mouth See admin instructions. 50 mg every AM and every 8 hours as  needed for severe pain    zolpidem 5 MG tablet    Commonly known as: AMBIEN    Take 2.5 mg by mouth at bedtime as needed for sleep.  Of note lisinopril has been discontinued     No orders of the defined types were placed in this encounter.  Immunization History   Administered  Date(s) Administered   .  Influenza Whole  03/21/2013   .  Pneumococcal Polysaccharide-23  02/03/2014    History   Substance Use Topics   .  Smoking status:  Never Smoker   .  Smokeless tobacco:  Not on file   .  Alcohol Use:  Yes      Comment: wine occasionally   Family history is noncontributory  Review of Systems  DATA OBTAINED: from patient- GENERAL:  no fevers, fatigue, appetite changes  SKIN: No itching, rash or wounds or diaphoresis  EARS: No earache,  Feels a fullness in his left ear feels it is plugged with wax  NOSE: No congestion, drainage or bleeding  MOUTH/THROAT: No mouth or tooth pain, No sore throat  RESPIRATORY: ,  No  wheezing,  or increased shortness of breath CARDIAC:  Recently complained of chest pain but hospital workup suggested GERD symptoms does not really complain of discomfort today- no, palpitations, lower extremity edema  GI: No abdominal pain, No N/V/D occasional constipation,-- history of GErD  does not complain of symptoms today--                       Physical Exam   he is afebrile pulse 78 blood pressure 106/61 GENERAL APPEARANCE: Alert, conversant. Appropriately groomed. No acute distress.  SKIN: No diaphoresis rash  HEAD: Normocephalic,  atraumatic  EYES: Conjunctiva/lids clear. Pupils round, reactive. EOMs intact.  EARS: E. Hearing grossly normal.---- I do note a small amount of wax in his left ear tympanic membrane is visualized- right ear appears essentially clear-- I do not note any exudate or erythema of either ear  NOSE: No deformity or discharge.  MOUTH/THROAT: Lips w/o lesions oropharynx clear  mucous membranes moist  RESPIRATORY: Breathing is even, unlabored. Lung sounds are clear   CARDIOVASCULAR: Heart RRR with distant heart sounds no murmurs, rubs or gallops. No peripheral edema.    Patient Active Problem List    Diagnosis  Date Noted   .  Chest pain  02/08/2014   .  Bradycardia  02/03/2014   .  Near syncope  02/01/2014   .  Hypotension  02/01/2014   .  Dementia with behavioral disturbance  01/24/2014   .  AAA (abdominal aortic aneurysm) without rupture  10/19/2013   .  Pneumonia  09/28/2013   .  Bipolar disorder, unspecified  02/04/2013   .  GERD (gastroesophageal reflux disease)  02/04/2013   .  Hyperglycemia  12/13/2012   .  Unspecified constipation  10/22/2012   .  Pure hypercholesterolemia  10/22/2012   .  Essential hypertension, benign  10/22/2012   .  Allergic rhinitis, cause unspecified  10/22/2012   Labs.  03/30/2015.  Sodium 139 potassium 4.2 BUN 24 creatinine 1.0.  Albumin 3.4.  Otherwise liver function tests within normal limits.  WBC 6.3 hemoglobin 10.9 platelets 151  01/05/2015.  LDL 63-triglycerides 117-HDL 33-cholesterol total 119.  WBC 5.5 hemoglobin 11.5 platelets 189.  Sodium 138 potassium 4 BUN 21 creatinine 1.0.  Hemoglobin A1c 4.6  11/22/2014.  WBC 5.8 hemoglobin 11.3 platelets 167.  Sodium 137 potassium 4.6 BUN 24 creatinine 1.1-CO2 level XXVI.  Liver function tests within normal limits except albumen of 3.4.  TSH-2.33.    06/30/2014.  WBC 7.2 hemoglobin 11.0 platelets 184.  Hemoglobin A1c 5.4.  Cholesterol 1:15 HDL 45 LDL 58 triglycerides 61.  Sodium 138 potassium 4.2 BUN 29 creatinine 1.0-liver function tests within normal limits of note albumin was 3.8  03/17/2014.  TSH-3.33.  03/20/2014.  Sodium 139 potassium 4.4 BUN 20 creatinine 1.0.  Liver function tests within normal limits of note albumin is 4.1 WBC 6.7 hemoglobin 12.2 platelets  194.    03/20/2014.  WBC 6.7 hemoglobin 12.2 platelets 194 . Sodium 139 potassium 4.4 BUN 20 creatinine 1.0  .TSH3.33    Component  Value  Date/Time    WBC  6.4  02/02/2014 0002    RBC  3.53*  02/02/2014 0002    HGB  11.8*  02/02/2014 0002    HCT  35.3*  02/02/2014 0002    PLT  176  02/02/2014 0002    MCV  100.0  02/02/2014 0002    LYMPHSABS  2.5  12/17/2010 0445    MONOABS  0.4  12/17/2010 0445    EOSABS  0.4  12/17/2010 0445    BASOSABS  0.0  12/17/2010 0445   CMP    Component  Value  Date/Time    NA  141  02/03/2014 0410    K  4.4  02/03/2014 0410    CL  103  02/03/2014 0410    CO2  26  02/03/2014 0410    GLUCOSE  87  02/03/2014 0410    BUN  18  02/03/2014 0410    CREATININE  1.05  02/03/2014 0410    CALCIUM  9.4  02/03/2014 0410  PROT  6.6  12/16/2010 1220    ALBUMIN  3.6  12/16/2010 1220    AST  16  12/16/2010 1220    ALT  15  12/16/2010 1220    ALKPHOS  77  12/16/2010 1220    BILITOT  0.3  12/16/2010 1220    GFRNONAA  63*  02/03/2014 0410    GFRAA  73*  02/03/2014 0410   Assessment and Plan     #1 left ear cerebrum impaction-will  Treat with Debrox 10 drops twice a day for 3 days and flush with warm water on day four-- monitor for resolution.   -.  #2 GERD-- continues on proton pump inhibitor twice a day- this appears to have stabilized   [id number]       .         Marland Kitchen

## 2015-04-25 ENCOUNTER — Encounter: Payer: Self-pay | Admitting: Internal Medicine

## 2015-04-25 DIAGNOSIS — H6120 Impacted cerumen, unspecified ear: Secondary | ICD-10-CM | POA: Insufficient documentation

## 2015-05-02 ENCOUNTER — Other Ambulatory Visit: Payer: Self-pay | Admitting: *Deleted

## 2015-05-02 MED ORDER — ZOLPIDEM TARTRATE 5 MG PO TABS: 5.0000 mg | ORAL_TABLET | Freq: Every evening | ORAL | Status: DC | PRN

## 2015-05-02 NOTE — Telephone Encounter (Signed)
[facility] 

## 2015-05-14 ENCOUNTER — Other Ambulatory Visit: Payer: Self-pay | Admitting: *Deleted

## 2015-05-14 MED ORDER — LORAZEPAM 0.5 MG PO TABS: 0.5000 mg | ORAL_TABLET | Freq: Two times a day (BID) | ORAL | Status: DC | PRN

## 2015-05-14 MED ORDER — TRAMADOL HCL 50 MG PO TABS: ORAL_TABLET | ORAL | Status: DC

## 2015-05-14 NOTE — Telephone Encounter (Signed)
[facility] 

## 2015-05-15 ENCOUNTER — Other Ambulatory Visit: Payer: Self-pay | Admitting: *Deleted

## 2015-05-15 MED ORDER — TRAMADOL HCL 50 MG PO TABS: ORAL_TABLET | ORAL | Status: DC

## 2015-05-15 MED ORDER — LORAZEPAM 0.5 MG PO TABS: 0.5000 mg | ORAL_TABLET | Freq: Two times a day (BID) | ORAL | Status: DC | PRN

## 2015-05-15 NOTE — Telephone Encounter (Signed)
[facility] 

## 2015-06-13 ENCOUNTER — Non-Acute Institutional Stay (SKILLED_NURSING_FACILITY): Payer: Medicare Other | Admitting: Internal Medicine

## 2015-06-13 ENCOUNTER — Encounter: Payer: Self-pay | Admitting: Internal Medicine

## 2015-06-13 DIAGNOSIS — F3178 Bipolar disorder, in full remission, most recent episode mixed: Secondary | ICD-10-CM | POA: Diagnosis not present

## 2015-06-13 DIAGNOSIS — F29 Unspecified psychosis not due to a substance or known physiological condition: Secondary | ICD-10-CM | POA: Insufficient documentation

## 2015-06-13 DIAGNOSIS — E7800 Pure hypercholesterolemia, unspecified: Secondary | ICD-10-CM | POA: Diagnosis not present

## 2015-06-13 DIAGNOSIS — F250 Schizoaffective disorder, bipolar type: Secondary | ICD-10-CM

## 2015-06-13 NOTE — Assessment & Plan Note (Signed)
Stable on invega  For psychotic sx;plan - cnt invega 3 mg qHS

## 2015-06-13 NOTE — Progress Notes (Signed)
MRN: [id number] Name: Greig CastillaHenry P Fenster  Sex: male Age: 81 y.o. DOB: 07/24/1926  [facility] Facility[facility]: Level Of Care: SNF Provider: Merrilee SeashoreALEXANDER, Bethaney Oshana D Emergency Contacts: Extended Emergency Contact Information Primary Emergency Contact: [name],[name] Address: [address]          [facility] [id number] [place] Home Phone: [phone] Mobile Phone: [phone] Relation: Daughter  Code Status:   Allergies: Ciprofloxacin hcl; Cocoa; Codeine; Hydrocodone; Librax; Loperamide hcl; Metaxalone; Penicillins; Sulfa antibiotics; and Tetracyclines & related  Chief Complaint  Patient presents with  . Medical Management of Chronic Issues    HPI: Patient is 81 y.o. male with bipolar dx, HTN, anxiety, dementia, CAD, GERD who is being seen for routine issues of HLD, bipolar dx and schizoaffective/psychosis dx.   Past Medical History  Diagnosis Date  . Bipolar 1 disorder (HCC)   . Hypertension   . Anxiety   . Paralysis agitans (HCC)   . Alzheimer disease   . Depression   . Fall   . GERD (gastroesophageal reflux disease)   . Cataract     bilateral  . AAA (abdominal aortic aneurysm) (HCC)   . CAD (coronary artery disease)     a. patient reported nonobstructive CAD. b. patient reported cath 2014 @ [facility]  . Hyperlipidemia     Past Surgical History  Procedure Laterality Date  . Cataract extraction Left       Medication List       This list is accurate as of: 06/13/15 11:59 PM.  Always use your most recent med list.               acetaminophen 325 MG tablet  Commonly known as:  TYLENOL  Take 650 mg by mouth every 4 (four) hours as needed for mild pain.     aspirin 325 MG EC tablet  Take 325 mg by mouth daily. For parkinson disease     atorvastatin 20 MG tablet  Commonly known as:  LIPITOR  Take 1 tablet (20 mg total) by mouth daily at 6 PM.     docusate sodium 100 MG capsule  Commonly known as:  COLACE  Take 100 mg by mouth daily. For  constipation     ENSURE  Take 237 mLs by mouth at bedtime. With snack     folic acid 1 MG tablet  Commonly known as:  FOLVITE  Take 1 mg by mouth daily.     galantamine 8 MG 24 hr capsule  Commonly known as:  RAZADYNE ER  Take 8 mg by mouth daily with breakfast. For alzheimer's disease     INVEGA 3 MG 24 hr tablet  Generic drug:  paliperidone  Take 3 mg by mouth at bedtime. For paranoia and mood     isosorbide mononitrate 30 MG 24 hr tablet  Commonly known as:  IMDUR  Take 30 mg by mouth daily. For heart disease     lamoTRIgine 100 MG tablet  Commonly known as:  LAMICTAL  Take 100 mg by mouth 2 (two) times daily. For bipolar disorder     loratadine 10 MG tablet  Commonly known as:  CLARITIN  Take 10 mg by mouth daily. For allergies     LORazepam 0.5 MG tablet  Commonly known as:  ATIVAN  Take 1 tablet (0.5 mg total) by mouth every 12 (twelve) hours as needed for anxiety.     nitroGLYCERIN 0.6 MG SL tablet  Commonly known as:  NITROSTAT  Place 0.6 mg  under the tongue every 5 (five) minutes as needed for chest pain.     omeprazole 20 MG capsule  Commonly known as:  PRILOSEC  Take 20 mg by mouth 2 (two) times daily. For reflux     phenazopyridine 100 MG tablet  Commonly known as:  PYRIDIUM  Take 100 mg by mouth 3 (three) times daily with meals. For enlarged prostate     senna-docusate 8.6-50 MG tablet  Commonly known as:  Senokot-S  Take 1 tablet by mouth daily.     tamsulosin 0.4 MG Caps capsule  Commonly known as:  FLOMAX  Take 0.4 mg by mouth daily after supper. For enlarged prostate     traMADol 50 MG tablet  Commonly known as:  ULTRAM  Take one tablet by mouth every 8 hours as needed for pain     zolpidem 5 MG tablet  Commonly known as:  AMBIEN  Take 1 tablet (5 mg total) by mouth at bedtime as needed for sleep.        No orders of the defined types were placed in this encounter.    Immunization History  Administered Date(s) Administered  .  Influenza Whole 03/21/2013  . Influenza-Unspecified 04/14/2014  . PPD Test 01/30/2010  . Pneumococcal Polysaccharide-23 02/03/2014    Social History  Substance Use Topics  . Smoking status: Never Smoker   . Smokeless tobacco: Not on file  . Alcohol Use: Yes     Comment: wine occasionally    Review of Systems  DATA OBTAINED: from patient - not reliable; nurse- no concerns GENERAL:  no fevers, fatigue, appetite changes SKIN: No itching, rash HEENT: No complaint RESPIRATORY: No cough, wheezing, SOB CARDIAC: No chest pain, palpitations, lower extremity edema  GI: No abdominal pain, No N/V/D or constipation, No heartburn or reflux  GU: No dysuria, frequency or urgency, or incontinence  MUSCULOSKELETAL: No unrelieved bone/joint pain NEUROLOGIC: No headache, dizziness  PSYCHIATRIC: No overt anxiety or sadness  Filed Vitals:   06/13/15 1324  BP: 136/83  Pulse: 78  Temp: 97.8 F (36.6 C)  Resp: 16    Physical Exam  GENERAL APPEARANCE: Alert, conversant, No acute distress  SKIN: No diaphoresis rash HEENT: Unremarkable RESPIRATORY: Breathing is even, unlabored. Lung sounds are clear   CARDIOVASCULAR: Heart RRR no murmurs, rubs or gallops. No peripheral edema  GASTROINTESTINAL: Abdomen is soft, non-tender, not distended w/ normal bowel sounds.  GENITOURINARY: Bladder non tender, not distended  MUSCULOSKELETAL: No abnormal joints or musculature NEUROLOGIC: Cranial nerves 2-12 grossly intact. Moves all extremities PSYCHIATRIC: Mood and affect appropriate to situation with dementia, no behavioral issues  Patient Active Problem List   Diagnosis Date Noted  . Psychosis 06/13/2015  . Cerumen debris on tympanic membrane 04/25/2015  . Cough 04/08/2015  . Rib pain 03/01/2015  . SOB (shortness of breath) 01/06/2015  . Schizoaffective disorder (HCC) 11/17/2014  . Back pain 06/29/2014  . Chest pain 02/08/2014  . Bradycardia 02/03/2014  . Near syncope 02/01/2014  . Hypotension  02/01/2014  . Dementia with behavioral disturbance 01/24/2014  . AAA (abdominal aortic aneurysm) without rupture (HCC) 10/19/2013  . Pneumonia 09/28/2013  . Bipolar disorder (HCC) 02/04/2013  . GERD (gastroesophageal reflux disease) 02/04/2013  . Hyperglycemia 12/13/2012  . Unspecified constipation 10/22/2012  . Pure hypercholesterolemia 10/22/2012  . Essential hypertension, benign 10/22/2012  . Allergic rhinitis 10/22/2012    CBC    Component Value Date/Time   WBC 7.7 08/11/2014   WBC 6.4 02/02/2014 0002   RBC 3.53* 02/02/2014  0002   HGB 11.9* 08/11/2014   HCT 37* 08/11/2014   PLT 182 08/11/2014   MCV 100.0 02/02/2014 0002   LYMPHSABS 2.5 12/17/2010 0445   MONOABS 0.4 12/17/2010 0445   EOSABS 0.4 12/17/2010 0445   BASOSABS 0.0 12/17/2010 0445    CMP     Component Value Date/Time   NA 141 08/11/2014   NA 141 02/03/2014 0410   K 4.6 08/11/2014   CL 103 02/03/2014 0410   CO2 26 02/03/2014 0410   GLUCOSE 87 02/03/2014 0410   BUN 26* 08/11/2014   BUN 18 02/03/2014 0410   CREATININE 1.0 08/11/2014   CREATININE 1.05 02/03/2014 0410   CALCIUM 9.4 02/03/2014 0410   PROT 6.6 12/16/2010 1220   ALBUMIN 3.6 12/16/2010 1220   AST 15 08/11/2014   ALT 17 08/11/2014   ALKPHOS 65 08/11/2014   BILITOT 0.3 12/16/2010 1220   GFRNONAA 63* 02/03/2014 0410   GFRAA 73* 02/03/2014 0410    Assessment and Plan  Pure hypercholesterolemia 11/2014 LDL 63, HDL33 on lipitor 2 mg dailyk;plan - order FLP  Bipolar disorder Pt is stable on lamictal 100 mg BID; plan - cont lamicta   Schizoaffective disorder Stable on invega  For psychotic sx;plan - cnt invega 3 mg qHS    Margit Hanks, MD

## 2015-06-13 NOTE — Assessment & Plan Note (Signed)
11/2014 LDL 63, HDL33 on lipitor 2 mg dailyk;plan - order FLP

## 2015-06-13 NOTE — Assessment & Plan Note (Signed)
Pt is stable on lamictal 100 mg BID; plan - cont lamicta

## 2015-07-23 ENCOUNTER — Encounter: Payer: Self-pay | Admitting: Internal Medicine

## 2015-07-23 ENCOUNTER — Non-Acute Institutional Stay (SKILLED_NURSING_FACILITY): Payer: Medicare Other | Admitting: Internal Medicine

## 2015-07-23 DIAGNOSIS — R51 Headache: Secondary | ICD-10-CM | POA: Diagnosis not present

## 2015-07-23 DIAGNOSIS — R079 Chest pain, unspecified: Secondary | ICD-10-CM

## 2015-07-23 DIAGNOSIS — R519 Headache, unspecified: Secondary | ICD-10-CM | POA: Insufficient documentation

## 2015-07-23 NOTE — Progress Notes (Signed)
Patient ID: Andre Marsh, male   DOB: 07/28/1926, 82 y.o.   MRN: [id number] MRN: [id number] Name: Andre Marsh  Sex: male Age: 82 y.o. DOB: 1926-10-08  [facility] Facility/Room: Level Of Care: SNF Provider: Roena Malady Emergency Contacts: Extended Emergency Contact Information Primary Emergency Contact: Andre Marsh Address: [address]          [facility] [address] [place] Home Phone: [phone] Mobile Phone: [phone] Relation: Daughter  Code Status:   Allergies: Ciprofloxacin hcl; Cocoa; Codeine; Hydrocodone; Librax; Loperamide hcl; Metaxalone; Penicillins; Sulfa antibiotics; and Tetracyclines & related  Chief Complaint  Patient presents with  . Acute Visit   Secondary to chest pain  History of present illness.  Patient is an 82 year old male with a history of diabetes as well as coronary artery disease-he is on aspirin 325 mg a day as well as nitroglycerin when necessary he also continues on Imdur as well as a statin He is complaining of chest pain this afternoon apparently he states it started about an hour ago pain does radiate to apparently it is flaccid and wanes in intensity.  He is not complaining of a shortness of breath nausea or vomiting or abdominal discomfort.  Patient has had this before and was hospitalized overnight associated appears for observation.  His blood pressure is somewhat elevated at 162/78 this afternoon  Past Medical History  Diagnosis Date  . Bipolar 1 disorder (HCC)   . Hypertension   . Anxiety   . Paralysis agitans (HCC)   . Alzheimer disease   . Depression   . Fall   . GERD (gastroesophageal reflux disease)   . Cataract     bilateral  . AAA (abdominal aortic aneurysm) (HCC)   . CAD (coronary artery disease)     a. patient reported nonobstructive CAD. b. patient reported cath 2014 @ [facility]  . Hyperlipidemia     Past Surgical History  Procedure Laterality Date  . Cataract extraction Left        Medication List       This list is accurate as of: 07/23/15 11:59 PM.  Always use your most recent med list.               acetaminophen 325 MG tablet  Commonly known as:  TYLENOL  Take 650 mg by mouth every 4 (four) hours as needed for mild pain.     aspirin 325 MG EC tablet  Take 325 mg by mouth daily. For parkinson disease     atorvastatin 20 MG tablet  Commonly known as:  LIPITOR  Take 1 tablet (20 mg total) by mouth daily at 6 PM.     docusate sodium 100 MG capsule  Commonly known as:  COLACE  Take 100 mg by mouth daily. For constipation     ENSURE  Take 237 mLs by mouth at bedtime. With snack     folic acid 1 MG tablet  Commonly known as:  FOLVITE  Take 1 mg by mouth daily.     galantamine 8 MG 24 hr capsule  Commonly known as:  RAZADYNE ER  Take 8 mg by mouth daily with breakfast. For alzheimer's disease     INVEGA 3 MG 24 hr tablet  Generic drug:  paliperidone  Take 3 mg by mouth at bedtime. For paranoia and mood     isosorbide mononitrate 30 MG 24 hr tablet  Commonly known as:  IMDUR  Take 30 mg by mouth daily. For  heart disease     lamoTRIgine 100 MG tablet  Commonly known as:  LAMICTAL  Take 100 mg by mouth 2 (two) times daily. For bipolar disorder     loratadine 10 MG tablet  Commonly known as:  CLARITIN  Take 10 mg by mouth daily. For allergies     LORazepam 0.5 MG tablet  Commonly known as:  ATIVAN  Take 1 tablet (0.5 mg total) by mouth every 12 (twelve) hours as needed for anxiety.     nitroGLYCERIN 0.6 MG SL tablet  Commonly known as:  NITROSTAT  Place 0.6 mg under the tongue every 5 (five) minutes as needed for chest pain.     omeprazole 20 MG capsule  Commonly known as:  PRILOSEC  Take 20 mg by mouth 2 (two) times daily. For reflux     phenazopyridine 100 MG tablet  Commonly known as:  PYRIDIUM  Take 100 mg by mouth 3 (three) times daily with meals. For enlarged prostate     senna-docusate 8.6-50 MG tablet  Commonly known as:   Senokot-S  Take 1 tablet by mouth daily.     tamsulosin 0.4 MG Caps capsule  Commonly known as:  FLOMAX  Take 0.4 mg by mouth daily after supper. For enlarged prostate     traMADol 50 MG tablet  Commonly known as:  ULTRAM  Take one tablet by mouth every 8 hours as needed for pain     zolpidem 5 MG tablet  Commonly known as:  AMBIEN  Take 1 tablet (5 mg total) by mouth at bedtime as needed for sleep.        No orders of the defined types were placed in this encounter.    Immunization History  Administered Date(s) Administered  . Influenza Whole 03/21/2013  . Influenza-Unspecified 04/14/2014  . PPD Test 01/30/2010  . Pneumococcal Polysaccharide-23 02/03/2014    Social History  Substance Use Topics  . Smoking status: Never Smoker   . Smokeless tobacco: Not on file  . Alcohol Use: Yes     Comment: wine occasionally    Review of Systems  DATA OBTAINED: from patient - not reliable; nurse- n GENERAL:  no fevers, fatigue, appetite changes SKIN: No itching, rash HEENT: No complaint RESPIRATORY: No cough, wheezing, SOB CARDIAC: has chest pain describes this as midsternal radiating to his left arm GI: No abdominal pain, No N/V/D or constipation, has a history of reflux GU: No dysuria, frequency or urgency, or incontinence  MUSCULOSKELETAL: No unrelieved bone/joint pain NEUROLOGIC: Complains of intermittent headache no numbness or syncopal-type feelings PSYCHIATRIC: No overt anxiety or sadness  Filed Vitals:   07/23/15 2140  BP: 162/78  Pulse: 60  Temp: 99.1 F (37.3 C)  Resp: 22    Physical Exam  GENERAL APPEARANCE: Alert, conversant, No acute distress  SKIN: No diaphoresis rash HEENT: Unremarkable RESPIRATORY: Breathing is even, unlabored. Lung sounds are clear   CARDIOVASCULAR: Heart RRR no murmurs, rubs or gallops. No peripheral edema  GASTROINTESTINAL: Abdomen is soft, non-tender, not distended w/ normal bowel sounds.  GENITOURINARY: Bladder non tender,  not distended  MUSCULOSKELETAL: No abnormal joints or musculature NEUROLOGIC: Cranial nerves 2-12 grossly intact. Moves all extremities PSYCHIATRIC: Mood and affect appropriate to situation with dementia, no behavioral issues  Patient Active Problem List   Diagnosis Date Noted  . Headache 07/23/2015  . Psychosis 06/13/2015  . Cerumen debris on tympanic membrane 04/25/2015  . Cough 04/08/2015  . Rib pain 03/01/2015  . SOB (shortness of breath) 01/06/2015  .  Schizoaffective disorder (HCC) 11/17/2014  . Back pain 06/29/2014  . Chest pain 02/08/2014  . Bradycardia 02/03/2014  . Near syncope 02/01/2014  . Hypotension 02/01/2014  . Dementia with behavioral disturbance 01/24/2014  . AAA (abdominal aortic aneurysm) without rupture (HCC) 10/19/2013  . Pneumonia 09/28/2013  . Bipolar disorder (HCC) 02/04/2013  . GERD (gastroesophageal reflux disease) 02/04/2013  . Hyperglycemia 12/13/2012  . Unspecified constipation 10/22/2012  . Pure hypercholesterolemia 10/22/2012  . Essential hypertension, benign 10/22/2012  . Allergic rhinitis 10/22/2012    CBC    Component Value Date/Time   WBC 7.7 08/11/2014   WBC 6.4 02/02/2014 0002   RBC 3.53* 02/02/2014 0002   HGB 11.9* 08/11/2014   HCT 37* 08/11/2014   PLT 182 08/11/2014   MCV 100.0 02/02/2014 0002   LYMPHSABS 2.5 12/17/2010 0445   MONOABS 0.4 12/17/2010 0445   EOSABS 0.4 12/17/2010 0445   BASOSABS 0.0 12/17/2010 0445    CMP     Component Value Date/Time   NA 141 08/11/2014   NA 141 02/03/2014 0410   K 4.6 08/11/2014   CL 103 02/03/2014 0410   CO2 26 02/03/2014 0410   GLUCOSE 87 02/03/2014 0410   BUN 26* 08/11/2014   BUN 18 02/03/2014 0410   CREATININE 1.0 08/11/2014   CREATININE 1.05 02/03/2014 0410   CALCIUM 9.4 02/03/2014 0410   PROT 6.6 12/16/2010 1220   ALBUMIN 3.6 12/16/2010 1220   AST 15 08/11/2014   ALT 17 08/11/2014   ALKPHOS 65 08/11/2014   BILITOT 0.3 12/16/2010 1220   GFRNONAA 63* 02/03/2014 0410   GFRAA  73* 02/03/2014 0410    Assessment and Plan Chest pain radiating to left arm-this is concerning for a cardiac event-although patient at times can be somewhat a poor historian-he does not appear to be in any acute distress-blood pressure is somewhat elevated he is also complaining of mid forehead headache.  He did receive nitroglycerin apparently this helps - however he was complaining of a headache although apparently he was complaining of this before receiving the nitroglycerin.  We will sent to the ER for evaluation-serial exams conducted before EMS arrived show patient to be stable no sign of distress he did continue to complain however of mainly his chest pain and headache discomfort at times.  [id number]   Crystalmarie Yasin C,

## 2015-07-25 ENCOUNTER — Non-Acute Institutional Stay (SKILLED_NURSING_FACILITY): Payer: Medicare Other | Admitting: Internal Medicine

## 2015-07-25 DIAGNOSIS — F3178 Bipolar disorder, in full remission, most recent episode mixed: Secondary | ICD-10-CM

## 2015-07-25 DIAGNOSIS — F03918 Unspecified dementia, unspecified severity, with other behavioral disturbance: Secondary | ICD-10-CM

## 2015-07-25 DIAGNOSIS — N400 Enlarged prostate without lower urinary tract symptoms: Secondary | ICD-10-CM

## 2015-07-25 DIAGNOSIS — R079 Chest pain, unspecified: Secondary | ICD-10-CM | POA: Diagnosis not present

## 2015-07-25 DIAGNOSIS — R791 Abnormal coagulation profile: Secondary | ICD-10-CM | POA: Diagnosis not present

## 2015-07-25 DIAGNOSIS — R7989 Other specified abnormal findings of blood chemistry: Secondary | ICD-10-CM

## 2015-07-25 DIAGNOSIS — K219 Gastro-esophageal reflux disease without esophagitis: Secondary | ICD-10-CM

## 2015-07-25 DIAGNOSIS — F0391 Unspecified dementia with behavioral disturbance: Secondary | ICD-10-CM | POA: Diagnosis not present

## 2015-07-29 ENCOUNTER — Encounter: Payer: Self-pay | Admitting: Internal Medicine

## 2015-07-29 DIAGNOSIS — R7989 Other specified abnormal findings of blood chemistry: Secondary | ICD-10-CM | POA: Insufficient documentation

## 2015-07-29 DIAGNOSIS — N400 Enlarged prostate without lower urinary tract symptoms: Secondary | ICD-10-CM | POA: Insufficient documentation

## 2015-07-29 NOTE — Progress Notes (Signed)
MRN: [id number] Name: Andre Marsh  Sex: male Age: 82 y.o. DOB: May 28, 1927  [facility] #: [facility] Facility/Room:307 Level Of Care: SNF Provider: Merrilee Seashore D Emergency Contacts: Extended Emergency Contact Information Primary Emergency Contact: Andre Marsh Address: [address]          [facility] [address] [place] Home Phone: [phone] Mobile Phone: [phone] Relation: Daughter  Code Status:   Allergies: Ciprofloxacin hcl; Cocoa; Codeine; Hydrocodone; Librax; Loperamide hcl; Metaxalone; Penicillins; Sulfa antibiotics; and Tetracyclines & related  Chief Complaint  Patient presents with  . Readmit To SNF    HPI: Patient is 82 y.o. male who presented to [facility] with L sided CP. He was admitted to telemetry for from 2/6-7 for overnight observation. His w/u was negative. He is admitted to SNF  For residential care. While at SNF pt will be followed for BPH, tx with flomax, bipolar disorder tx with lamictal, and GERD, tx with prilosec.  Past Medical History  Diagnosis Date  . Bipolar 1 disorder (HCC)   . Hypertension   . Anxiety   . Paralysis agitans (HCC)   . Alzheimer disease   . Depression   . Fall   . GERD (gastroesophageal reflux disease)   . Cataract     bilateral  . AAA (abdominal aortic aneurysm) (HCC)   . CAD (coronary artery disease)     a. patient reported nonobstructive CAD. b. patient reported cath 2014 @ [facility]  . Hyperlipidemia     Past Surgical History  Procedure Laterality Date  . Cataract extraction Left       Medication List       This list is accurate as of: 07/25/15 11:59 PM.  Always use your most recent med list.               acetaminophen 325 MG tablet  Commonly known as:  TYLENOL  Take 650 mg by mouth every 4 (four) hours as needed for mild pain.     aspirin 325 MG EC tablet  Take 325 mg by mouth daily. For parkinson disease     atorvastatin 20 MG tablet  Commonly known as:  LIPITOR  Take 1 tablet (20 mg total)  by mouth daily at 6 PM.     docusate sodium 100 MG capsule  Commonly known as:  COLACE  Take 100 mg by mouth daily. For constipation     ENSURE  Take 237 mLs by mouth at bedtime. With snack     folic acid 1 MG tablet  Commonly known as:  FOLVITE  Take 1 mg by mouth daily.     galantamine 8 MG 24 hr capsule  Commonly known as:  RAZADYNE ER  Take 8 mg by mouth daily with breakfast. For alzheimer's disease     INVEGA 3 MG 24 hr tablet  Generic drug:  paliperidone  Take 3 mg by mouth at bedtime. For paranoia and mood     isosorbide mononitrate 30 MG 24 hr tablet  Commonly known as:  IMDUR  Take 30 mg by mouth daily. For heart disease     lamoTRIgine 100 MG tablet  Commonly known as:  LAMICTAL  Take 100 mg by mouth 2 (two) times daily. For bipolar disorder     loratadine 10 MG tablet  Commonly known as:  CLARITIN  Take 10 mg by mouth daily. For allergies     LORazepam 0.5 MG tablet  Commonly known as:  ATIVAN  Take 1 tablet (0.5 mg total)  by mouth every 12 (twelve) hours as needed for anxiety.     nitroGLYCERIN 0.6 MG SL tablet  Commonly known as:  NITROSTAT  Place 0.6 mg under the tongue every 5 (five) minutes as needed for chest pain.     omeprazole 20 MG capsule  Commonly known as:  PRILOSEC  Take 20 mg by mouth 2 (two) times daily. For reflux     phenazopyridine 100 MG tablet  Commonly known as:  PYRIDIUM  Take 100 mg by mouth 3 (three) times daily with meals. For enlarged prostate     senna-docusate 8.6-50 MG tablet  Commonly known as:  Senokot-S  Take 1 tablet by mouth daily.     tamsulosin 0.4 MG Caps capsule  Commonly known as:  FLOMAX  Take 0.4 mg by mouth daily after supper. For enlarged prostate     traMADol 50 MG tablet  Commonly known as:  ULTRAM  Take one tablet by mouth every 8 hours as needed for pain     zolpidem 5 MG tablet  Commonly known as:  AMBIEN  Take 1 tablet (5 mg total) by mouth at bedtime as needed for sleep.        No orders  of the defined types were placed in this encounter.    Immunization History  Administered Date(s) Administered  . Influenza Whole 03/21/2013  . Influenza-Unspecified 04/14/2014  . PPD Test 01/30/2010  . Pneumococcal Polysaccharide-23 02/03/2014    Social History  Substance Use Topics  . Smoking status: Never Smoker   . Smokeless tobacco: Not on file  . Alcohol Use: Yes     Comment: wine occasionally    Family history is + CHF  Review of Systems  DATA OBTAINED: from patient, nurse GENERAL:  no fevers, fatigue, appetite changes SKIN: No itching, rash or wounds EYES: No eye pain, redness, discharge EARS: No earache, tinnitus, change in hearing NOSE: No congestion, drainage or bleeding  MOUTH/THROAT: No mouth or tooth pain, No sore throat RESPIRATORY: No cough, wheezing, SOB CARDIAC: No chest pain, palpitations, lower extremity edema  GI: No abdominal pain, No N/V/D or constipation, No heartburn or reflux  GU: No dysuria, frequency or urgency, or incontinence  MUSCULOSKELETAL: No unrelieved bone/joint pain NEUROLOGIC: No headache, dizziness or focal weakness PSYCHIATRIC: No c/o anxiety or sadness   Filed Vitals:   07/29/15 1847  BP: 136/83  Pulse: 64  Temp: 97.7 F (36.5 C)  Resp: 18    SpO2 Readings from Last 1 Encounters:  02/03/14 92%        Physical Exam  GENERAL APPEARANCE: Alert, conversant,  No acute distress.  SKIN: No diaphoresis rash HEAD: Normocephalic, atraumatic  EYES: Conjunctiva/lids clear. Pupils round, reactive. EOMs intact.  EARS: External exam WNL, canals clear. Hearing grossly normal.  NOSE: No deformity or discharge.  MOUTH/THROAT: Lips w/o lesions  RESPIRATORY: Breathing is even, unlabored. Lung sounds are clear   CARDIOVASCULAR: Heart RRR no murmurs, rubs or gallops. No peripheral edema.   GASTROINTESTINAL: Abdomen is soft, non-tender, not distended w/ normal bowel sounds. GENITOURINARY: Bladder non tender, not distended   MUSCULOSKELETAL: No abnormal joints or musculature NEUROLOGIC:  Cranial nerves 2-12 grossly intact. Moves all extremities  PSYCHIATRIC: odd affect, no behavioral issues  Patient Active Problem List   Diagnosis Date Noted  . D-dimer, elevated 07/29/2015  . BPH (benign prostatic hypertrophy) 07/29/2015  . Headache 07/23/2015  . Psychosis 06/13/2015  . Cerumen debris on tympanic membrane 04/25/2015  . Cough 04/08/2015  . Rib pain  03/01/2015  . SOB (shortness of breath) 01/06/2015  . Schizoaffective disorder (HCC) 11/17/2014  . Back pain 06/29/2014  . Chest pain 02/08/2014  . Bradycardia 02/03/2014  . Near syncope 02/01/2014  . Hypotension 02/01/2014  . Dementia with behavioral disturbance 01/24/2014  . AAA (abdominal aortic aneurysm) without rupture (HCC) 10/19/2013  . Pneumonia 09/28/2013  . Bipolar disorder (HCC) 02/04/2013  . GERD (gastroesophageal reflux disease) 02/04/2013  . Hyperglycemia 12/13/2012  . Unspecified constipation 10/22/2012  . Pure hypercholesterolemia 10/22/2012  . Essential hypertension, benign 10/22/2012  . Allergic rhinitis 10/22/2012    CBC    Component Value Date/Time   WBC 7.7 08/11/2014   WBC 6.4 02/02/2014 0002   RBC 3.53* 02/02/2014 0002   HGB 11.9* 08/11/2014   HCT 37* 08/11/2014   PLT 182 08/11/2014   MCV 100.0 02/02/2014 0002   LYMPHSABS 2.5 12/17/2010 0445   MONOABS 0.4 12/17/2010 0445   EOSABS 0.4 12/17/2010 0445   BASOSABS 0.0 12/17/2010 0445    CMP     Component Value Date/Time   NA 141 08/11/2014   NA 141 02/03/2014 0410   K 4.6 08/11/2014   CL 103 02/03/2014 0410   CO2 26 02/03/2014 0410   GLUCOSE 87 02/03/2014 0410   BUN 26* 08/11/2014   BUN 18 02/03/2014 0410   CREATININE 1.0 08/11/2014   CREATININE 1.05 02/03/2014 0410   CALCIUM 9.4 02/03/2014 0410   PROT 6.6 12/16/2010 1220   ALBUMIN 3.6 12/16/2010 1220   AST 15 08/11/2014   ALT 17 08/11/2014   ALKPHOS 65 08/11/2014   BILITOT 0.3 12/16/2010 1220   GFRNONAA  63* 02/03/2014 0410   GFRAA 73* 02/03/2014 0410    Lab Results  Component Value Date   HGBA1C 5.4 06/30/2014     Dg Chest 2 View  02/01/2014  CLINICAL DATA:  Near syncope EXAM: CHEST  2 VIEW COMPARISON:  10/19/2012 FINDINGS: Heart size is upper normal. Negative for heart failure. Left lower lobe airspace density similar to the prior study and most consistent with scarring. Negative for pleural effusion. Severe compression fracture approximately T12 is unchanged. IMPRESSION: Left lower lobe airspace disease is most consistent with scarring. Electronically Signed   By: Marlan Palau M.D.   On: 02/01/2014 13:06   Ct Angio Chest Pe W/cm &/or Wo Cm  02/02/2014  CLINICAL DATA:  Elevated D-dimer, possible PE EXAM: CT ANGIOGRAPHY CHEST WITH CONTRAST TECHNIQUE: Multidetector CT imaging of the chest was performed using the standard protocol during bolus administration of intravenous contrast. Multiplanar CT image reconstructions and MIPs were obtained to evaluate the vascular anatomy. CONTRAST:  OMNIPAQUE IOHEXOL 350 MG/ML SOLN COMPARISON:  None FINDINGS: Sagittal images of the spine shows degenerative changes thoracic spine. Cardiomegaly is noted. Atherosclerotic calcifications of thoracic aorta and coronary arteries. There is moderate size hiatal hernia. The patient is status postcholecystectomy. No pulmonary embolus is noted. The study is of excellent technical quality. No mediastinal hematoma or adenopathy. Images of the lung parenchyma shows no acute infiltrate or pulmonary edema. There is geographic mild parenchymal ground-glass attenuation probable due to hypoventilatory changes. No segmental infiltrate. No pneumothorax. Mild interstitial prominence bilateral lower lobe probable chronic in nature. Review of the MIP images confirms the above findings. IMPRESSION: 1. No pulmonary embolus is noted. 2. Atherosclerotic calcifications of thoracic aorta and coronary arteries. 3. No mediastinal hematoma or  adenopathy. 4. No segmental infiltrate or pulmonary edema. Mild patchy ground-glass attenuation bilaterally probable due to hypoventilatory changes. Mild interstitial prominence bilaterally probable chronic in  nature. Electronically Signed   By: Natasha Mead M.D.   On: 02/02/2014 12:44    Not all labs, radiology exams or other studies done during hospitalization come through on my EPIC note; however they are reviewed by me.    Assessment and Plan  Chest pain Serial troponins were neg, dobutamine stress ECHO showed no wall abnormality  D-dimer, elevated During hospitalization for r/o so CTA was done-it was negative except for a filing defect felt to be secretions.  Bipolar disorder Chronic and stable; Continue lamictal  GERD (gastroesophageal reflux disease) Stble - cont prilosec 20 mg BID  Dementia with behavioral disturbance No large declines; cont razayne  BPH (benign prostatic hypertrophy) Chroic and stable;cont flomax   Time spent > 35 min;> 50% of time with patient was spent reviewing records, labs, tests and studies, counseling and developing plan of care  Margit Hanks, MD

## 2015-07-29 NOTE — Assessment & Plan Note (Signed)
No large declines; cont razayne

## 2015-07-29 NOTE — Assessment & Plan Note (Signed)
Chroic and stable;cont flomax

## 2015-07-29 NOTE — Assessment & Plan Note (Signed)
Stble - cont prilosec 20 mg BID

## 2015-07-29 NOTE — Assessment & Plan Note (Signed)
During hospitalization for r/o so CTA was done-it was negative except for a filing defect felt to be secretions.

## 2015-07-29 NOTE — Assessment & Plan Note (Signed)
Serial troponins were neg, dobutamine stress ECHO showed no wall abnormality

## 2015-07-29 NOTE — Assessment & Plan Note (Signed)
Chronic and stable; Continue lamictal

## 2015-08-06 ENCOUNTER — Non-Acute Institutional Stay (SKILLED_NURSING_FACILITY): Payer: Medicare Other | Admitting: Internal Medicine

## 2015-08-06 ENCOUNTER — Encounter: Payer: Self-pay | Admitting: Internal Medicine

## 2015-08-06 DIAGNOSIS — F03918 Unspecified dementia, unspecified severity, with other behavioral disturbance: Secondary | ICD-10-CM

## 2015-08-06 DIAGNOSIS — F3178 Bipolar disorder, in full remission, most recent episode mixed: Secondary | ICD-10-CM | POA: Diagnosis not present

## 2015-08-06 DIAGNOSIS — F250 Schizoaffective disorder, bipolar type: Secondary | ICD-10-CM

## 2015-08-06 DIAGNOSIS — F0391 Unspecified dementia with behavioral disturbance: Secondary | ICD-10-CM

## 2015-08-06 DIAGNOSIS — I10 Essential (primary) hypertension: Secondary | ICD-10-CM

## 2015-08-06 DIAGNOSIS — K219 Gastro-esophageal reflux disease without esophagitis: Secondary | ICD-10-CM | POA: Diagnosis not present

## 2015-08-06 DIAGNOSIS — J3089 Other allergic rhinitis: Secondary | ICD-10-CM | POA: Diagnosis not present

## 2015-08-06 NOTE — Progress Notes (Signed)
MRN: [id number] Name: Andre Marsh  Sex: male Age: 82 y.o. DOB: Nov 04, 1926  [facility]: [name] Facility/Room:307 Level Of Care: SNF Provider: Merrilee Seashore D Emergency Contacts: Extended Emergency Contact Information Primary Emergency Contact: [name],[name] Address: [address]          [facility] [address] [place] Home Phone: [phone] Mobile Phone: [phone] Relation: Daughter  Code Status:   Allergies: Ciprofloxacin hcl; Cocoa; Codeine; Hydrocodone; Librax; Loperamide hcl; Metaxalone; Penicillins; Sulfa antibiotics; and Tetracyclines & related  Chief Complaint  Patient presents with  . Discharge Note    HPI: Patient is 82 y.o. male with bipolar dx, HTN, alzheimers, depression and anxiety who is being discharged to ALF because CMS PASSAR would not approve him for residence at [facility].  Past Medical History  Diagnosis Date  . Bipolar 1 disorder (HCC)   . Hypertension   . Anxiety   . Paralysis agitans (HCC)   . Alzheimer disease   . Depression   . Fall   . GERD (gastroesophageal reflux disease)   . Cataract     bilateral  . AAA (abdominal aortic aneurysm) (HCC)   . CAD (coronary artery disease)     a. patient reported nonobstructive CAD. b. patient reported cath 2014 @ [facility]  . Hyperlipidemia     Past Surgical History  Procedure Laterality Date  . Cataract extraction Left       Medication List       This list is accurate as of: 08/06/15  4:44 PM.  Always use your most recent med list.               acetaminophen 325 MG tablet  Commonly known as:  TYLENOL  Take 650 mg by mouth every 4 (four) hours as needed for mild pain.     aspirin 325 MG EC tablet  Take 325 mg by mouth daily. For parkinson disease     atorvastatin 20 MG tablet  Commonly known as:  LIPITOR  Take 1 tablet (20 mg total) by mouth daily at 6 PM.     docusate sodium 100 MG capsule  Commonly known as:  COLACE  Take 100 mg by mouth daily. For constipation     ENSURE  Take 237 mLs by mouth at bedtime. With snack     folic acid 1 MG tablet  Commonly known as:  FOLVITE  Take 1 mg by mouth daily.     galantamine 8 MG 24 hr capsule  Commonly known as:  RAZADYNE ER  Take 8 mg by mouth daily with breakfast. For alzheimer's disease     INVEGA 3 MG 24 hr tablet  Generic drug:  paliperidone  Take 3 mg by mouth at bedtime. For paranoia and mood     isosorbide mononitrate 30 MG 24 hr tablet  Commonly known as:  IMDUR  Take 30 mg by mouth daily. For heart disease     lamoTRIgine 100 MG tablet  Commonly known as:  LAMICTAL  Take 100 mg by mouth 2 (two) times daily. For bipolar disorder     loratadine 10 MG tablet  Commonly known as:  CLARITIN  Take 10 mg by mouth daily. For allergies     LORazepam 0.5 MG tablet  Commonly known as:  ATIVAN  Take 1 tablet (0.5 mg total) by mouth every 12 (twelve) hours as needed for anxiety.     nitroGLYCERIN 0.6 MG SL tablet  Commonly known as:  NITROSTAT  Place 0.6 mg under  the tongue every 5 (five) minutes as needed for chest pain.     omeprazole 20 MG capsule  Commonly known as:  PRILOSEC  Take 20 mg by mouth 2 (two) times daily. For reflux     phenazopyridine 100 MG tablet  Commonly known as:  PYRIDIUM  Take 100 mg by mouth 3 (three) times daily with meals. For enlarged prostate     senna-docusate 8.6-50 MG tablet  Commonly known as:  Senokot-S  Take 1 tablet by mouth daily.     tamsulosin 0.4 MG Caps capsule  Commonly known as:  FLOMAX  Take 0.4 mg by mouth daily after supper. For enlarged prostate     traMADol 50 MG tablet  Commonly known as:  ULTRAM  Take one tablet by mouth every 8 hours as needed for pain     zolpidem 5 MG tablet  Commonly known as:  AMBIEN  Take 1 tablet (5 mg total) by mouth at bedtime as needed for sleep.        No orders of the defined types were placed in this encounter.    Immunization History  Administered Date(s) Administered  . Influenza Whole  03/21/2013  . Influenza-Unspecified 04/14/2014  . PPD Test 01/30/2010  . Pneumococcal Polysaccharide-23 02/03/2014    Social History  Substance Use Topics  . Smoking status: Never Smoker   . Smokeless tobacco: Not on file  . Alcohol Use: Yes     Comment: wine occasionally    Filed Vitals:   08/06/15 1641  BP: 136/83  Pulse: 64  Temp: 97.7 F (36.5 C)  Resp: 18    Physical Exam  GENERAL APPEARANCE: Alert, conversant. No acute distress.  HEENT: Unremarkable. RESPIRATORY: Breathing is even, unlabored. Lung sounds are clear   CARDIOVASCULAR: Heart RRR no murmurs, rubs or gallops. No peripheral edema.  GASTROINTESTINAL: Abdomen is soft, non-tender, not distended w/ normal bowel sounds.  NEUROLOGIC: Cranial nerves 2-12 grossly intact. Moves all extremities  Patient Active Problem List   Diagnosis Date Noted  . D-dimer, elevated 07/29/2015  . BPH (benign prostatic hypertrophy) 07/29/2015  . Headache 07/23/2015  . Psychosis 06/13/2015  . Cerumen debris on tympanic membrane 04/25/2015  . Cough 04/08/2015  . Rib pain 03/01/2015  . SOB (shortness of breath) 01/06/2015  . Schizoaffective disorder (HCC) 11/17/2014  . Back pain 06/29/2014  . Chest pain 02/08/2014  . Bradycardia 02/03/2014  . Near syncope 02/01/2014  . Hypotension 02/01/2014  . Dementia with behavioral disturbance 01/24/2014  . AAA (abdominal aortic aneurysm) without rupture (HCC) 10/19/2013  . Pneumonia 09/28/2013  . Bipolar disorder (HCC) 02/04/2013  . GERD (gastroesophageal reflux disease) 02/04/2013  . Hyperglycemia 12/13/2012  . Unspecified constipation 10/22/2012  . Pure hypercholesterolemia 10/22/2012  . Essential hypertension, benign 10/22/2012  . Allergic rhinitis 10/22/2012    CBC    Component Value Date/Time   WBC 7.7 08/11/2014   WBC 6.4 02/02/2014 0002   RBC 3.53* 02/02/2014 0002   HGB 11.9* 08/11/2014   HCT 37* 08/11/2014   PLT 182 08/11/2014   MCV 100.0 02/02/2014 0002   LYMPHSABS  2.5 12/17/2010 0445   MONOABS 0.4 12/17/2010 0445   EOSABS 0.4 12/17/2010 0445   BASOSABS 0.0 12/17/2010 0445    CMP     Component Value Date/Time   NA 141 08/11/2014   NA 141 02/03/2014 0410   K 4.6 08/11/2014   CL 103 02/03/2014 0410   CO2 26 02/03/2014 0410   GLUCOSE 87 02/03/2014 0410   BUN 26*  08/11/2014   BUN 18 02/03/2014 0410   CREATININE 1.0 08/11/2014   CREATININE 1.05 02/03/2014 0410   CALCIUM 9.4 02/03/2014 0410   PROT 6.6 12/16/2010 1220   ALBUMIN 3.6 12/16/2010 1220   AST 15 08/11/2014   ALT 17 08/11/2014   ALKPHOS 65 08/11/2014   BILITOT 0.3 12/16/2010 1220   GFRNONAA 63* 02/03/2014 0410   GFRAA 73* 02/03/2014 0410    Assessment and Plan  Pt will be going to [facility]. His medications have been reconciled and his rx's have been written.  Margit Hanks, MD

## 2015-08-15 ENCOUNTER — Telehealth: Payer: Self-pay | Admitting: *Deleted

## 2015-08-15 NOTE — Telephone Encounter (Signed)
Marylene Land with [facility] called and stated that she spoke with Dr. Lyn Hollingshead over the weekend concerning patient and Dr. Lyn Hollingshead ordered a Urine and stated that she would follow through with it per Marylene Land. Explained to her that everything needed to go to patient's primary provider since patient has been discharged from [facility]. She stated that everything else will but Dr. Lyn Hollingshead told her that she would do this.  Urine faxed and placed in Dr. Mardelle Matte box for review.

## 2015-08-30 ENCOUNTER — Emergency Department (HOSPITAL_COMMUNITY)
Admission: EM | Admit: 2015-08-30 | Discharge: 2015-08-30 | Disposition: A | Discharge status: Home / Self Care | Payer: Medicare Other | Attending: Emergency Medicine | Admitting: Emergency Medicine

## 2015-08-30 ENCOUNTER — Emergency Department (HOSPITAL_COMMUNITY): Payer: Medicare Other

## 2015-08-30 ENCOUNTER — Encounter (HOSPITAL_COMMUNITY): Payer: Self-pay | Admitting: *Deleted

## 2015-08-30 DIAGNOSIS — I10 Essential (primary) hypertension: Secondary | ICD-10-CM | POA: Insufficient documentation

## 2015-08-30 DIAGNOSIS — F319 Bipolar disorder, unspecified: Secondary | ICD-10-CM | POA: Insufficient documentation

## 2015-08-30 DIAGNOSIS — K219 Gastro-esophageal reflux disease without esophagitis: Secondary | ICD-10-CM | POA: Insufficient documentation

## 2015-08-30 DIAGNOSIS — G309 Alzheimer's disease, unspecified: Secondary | ICD-10-CM | POA: Diagnosis not present

## 2015-08-30 DIAGNOSIS — E785 Hyperlipidemia, unspecified: Secondary | ICD-10-CM | POA: Diagnosis not present

## 2015-08-30 DIAGNOSIS — I2510 Atherosclerotic heart disease of native coronary artery without angina pectoris: Secondary | ICD-10-CM | POA: Insufficient documentation

## 2015-08-30 DIAGNOSIS — R079 Chest pain, unspecified: Secondary | ICD-10-CM

## 2015-08-30 DIAGNOSIS — Z79899 Other long term (current) drug therapy: Secondary | ICD-10-CM | POA: Insufficient documentation

## 2015-08-30 DIAGNOSIS — F419 Anxiety disorder, unspecified: Secondary | ICD-10-CM | POA: Diagnosis not present

## 2015-08-30 DIAGNOSIS — Z880 Allergy status to penicillin: Secondary | ICD-10-CM | POA: Insufficient documentation

## 2015-08-30 DIAGNOSIS — Z7982 Long term (current) use of aspirin: Secondary | ICD-10-CM | POA: Diagnosis not present

## 2015-08-30 LAB — URINALYSIS, ROUTINE W REFLEX MICROSCOPIC
BILIRUBIN URINE: NEGATIVE
GLUCOSE, UA: NEGATIVE mg/dL
Ketones, ur: NEGATIVE mg/dL
Leukocytes, UA: NEGATIVE
Nitrite: NEGATIVE
Protein, ur: NEGATIVE mg/dL
SPECIFIC GRAVITY, URINE: 1.017 (ref 1.005–1.030)
pH: 6 (ref 5.0–8.0)

## 2015-08-30 LAB — I-STAT TROPONIN, ED: Troponin i, poc: 0.01 ng/mL (ref 0.00–0.08)

## 2015-08-30 LAB — COMPREHENSIVE METABOLIC PANEL
ALBUMIN: 3.3 g/dL — AB (ref 3.5–5.0)
ALT: 19 U/L (ref 17–63)
AST: 31 U/L (ref 15–41)
Alkaline Phosphatase: 74 U/L (ref 38–126)
Anion gap: 11 (ref 5–15)
BUN: 19 mg/dL (ref 6–20)
CHLORIDE: 103 mmol/L (ref 101–111)
CO2: 25 mmol/L (ref 22–32)
CREATININE: 0.95 mg/dL (ref 0.61–1.24)
Calcium: 9.6 mg/dL (ref 8.9–10.3)
GFR calc non Af Amer: >60 mL/min (ref >60)
GLUCOSE: 113 mg/dL — AB (ref 65–99)
Potassium: 4.9 mmol/L (ref 3.5–5.1)
SODIUM: 139 mmol/L (ref 135–145)
Total Bilirubin: 0.7 mg/dL (ref 0.3–1.2)
Total Protein: 5.7 g/dL — ABNORMAL LOW (ref 6.5–8.1)

## 2015-08-30 LAB — CBC
HCT: 37.3 % — ABNORMAL LOW (ref 39.0–52.0)
Hemoglobin: 12.4 g/dL — ABNORMAL LOW (ref 13.0–17.0)
MCH: 33.1 pg (ref 26.0–34.0)
MCHC: 33.2 g/dL (ref 30.0–36.0)
MCV: 99.5 fL (ref 78.0–100.0)
PLATELETS: 185 10*3/uL (ref 150–400)
RBC: 3.75 MIL/uL — AB (ref 4.22–5.81)
RDW: 13.3 % (ref 11.5–15.5)
WBC: 6.9 10*3/uL (ref 4.0–10.5)

## 2015-08-30 LAB — TROPONIN I

## 2015-08-30 LAB — URINE MICROSCOPIC-ADD ON

## 2015-08-30 MED ORDER — ONDANSETRON HCL 4 MG/2ML IJ SOLN
4.0000 mg | Freq: Once | INTRAMUSCULAR | Status: AC
  Administered 2015-08-30: 4 mg via INTRAVENOUS
  Filled 2015-08-30: qty 2

## 2015-08-30 NOTE — ED Provider Notes (Signed)
CSN: [id number]     Arrival date & time 08/30/15  1121 History   First MD Initiated Contact with Patient 08/30/15 1209     Chief Complaint  Patient presents with  . Chest Pain     (Consider location/radiation/quality/duration/timing/severity/associated sxs/prior Treatment) Patient is a 82 y.o. male presenting with chest pain. The history is provided by the patient. No language interpreter was used.  Chest Pain Pain location:  L chest Pain quality: aching   Pain radiates to:  Does not radiate Pain radiates to the back: no   Pain severity:  Moderate Onset quality:  Gradual Timing:  Constant Progression:  Worsening Chronicity:  New Context: not breathing   Relieved by:  Nothing Worsened by:  Nothing tried Ineffective treatments:  None tried Associated symptoms: no dizziness and no numbness   Risk factors: coronary artery disease and hypertension   Pt complained of chest pain earlier today.   Pt told EMS he did not have chest pain.   Pt reports some mild pain in his chest today.  Pt reports more pain in his back and he is concerned that he has a urinary tract infection.    Past Medical History  Diagnosis Date  . Bipolar 1 disorder (HCC)   . Hypertension   . Anxiety   . Paralysis agitans (HCC)   . Alzheimer disease   . Depression   . Fall   . GERD (gastroesophageal reflux disease)   . Cataract     bilateral  . AAA (abdominal aortic aneurysm) (HCC)   . CAD (coronary artery disease)     a. patient reported nonobstructive CAD. b. patient reported cath 2014 @ [facility]  . Hyperlipidemia    Past Surgical History  Procedure Laterality Date  . Cataract extraction Left    Family History  Problem Relation Age of Onset  . Heart failure Mother    Social History  Substance Use Topics  . Smoking status: Never Smoker   . Smokeless tobacco: None  . Alcohol Use: Yes     Comment: wine occasionally    Review of Systems  Cardiovascular: Positive for chest pain.  Neurological:  Negative for dizziness and numbness.  All other systems reviewed and are negative.     Allergies  Ciprofloxacin hcl; Cocoa; Codeine; Hydrocodone; Librax; Loperamide hcl; Metaxalone; Penicillins; Sulfa antibiotics; and Tetracyclines & related  Home Medications   Prior to Admission medications   Medication Sig Start Date End Date Taking? Authorizing Provider  acetaminophen (TYLENOL) 325 MG tablet Take 650 mg by mouth every 4 (four) hours as needed for mild pain.   Yes Historical Provider, MD  aspirin 81 MG tablet Take 81 mg by mouth daily.   Yes Historical Provider, MD  folic acid (FOLVITE) 1 MG tablet Take 1 mg by mouth daily.   Yes Historical Provider, MD  galantamine (RAZADYNE) 4 MG tablet Take 4 mg by mouth daily.   Yes Historical Provider, MD  INVEGA 3 MG 24 hr tablet Take 3 mg by mouth at bedtime. For paranoia and mood 01/12/14  Yes Historical Provider, MD  isosorbide mononitrate (IMDUR) 30 MG 24 hr tablet Take 30 mg by mouth daily. For heart disease   Yes Historical Provider, MD  lamoTRIgine (LAMICTAL) 100 MG tablet Take 100 mg by mouth 2 (two) times daily. For bipolar disorder   Yes Historical Provider, MD  loratadine (CLARITIN) 10 MG tablet Take 10 mg by mouth daily. For allergies   Yes Historical Provider, MD  LORazepam (ATIVAN) 0.5 MG  tablet Take 1 tablet (0.5 mg total) by mouth every 12 (twelve) hours as needed for anxiety. Patient taking differently: Take 1 mg by mouth every 12 (twelve) hours as needed for anxiety.  05/15/15  Yes Sharon SellerJessica K Eubanks, NP  omeprazole (PRILOSEC) 20 MG capsule Take 20 mg by mouth 2 (two) times daily. For reflux   Yes Historical Provider, MD  phenazopyridine (PYRIDIUM) 100 MG tablet Take 100 mg by mouth 3 (three) times daily with meals. For enlarged prostate   Yes Historical Provider, MD  Propylene Glycol (SYSTANE BALANCE) 0.6 % SOLN Apply 1 drop to eye 2 (two) times daily.   Yes Historical Provider, MD  psyllium (METAMUCIL SMOOTH TEXTURE) 28 % packet  Take 1 packet by mouth at bedtime.   Yes Historical Provider, MD  senna (SENOKOT) 8.6 MG TABS tablet Take 1 tablet by mouth daily as needed for mild constipation.   Yes Historical Provider, MD  senna-docusate (SENOKOT-S) 8.6-50 MG per tablet Take 1 tablet by mouth daily.    Yes Historical Provider, MD  tamsulosin (FLOMAX) 0.4 MG CAPS capsule Take 0.4 mg by mouth daily after supper. For enlarged prostate   Yes Historical Provider, MD  traMADol (ULTRAM) 50 MG tablet Take one tablet by mouth every 8 hours as needed for pain Patient taking differently: 50 mg every 12 (twelve) hours as needed. Take one tablet by mouth every 8 hours as needed for pain 05/15/15  Yes Sharon SellerJessica K Eubanks, NP  zolpidem (AMBIEN) 5 MG tablet Take 1 tablet (5 mg total) by mouth at bedtime as needed for sleep. 05/02/15  Yes Kimber RelicArthur G Green, MD  aspirin 325 MG EC tablet Take 325 mg by mouth daily. For parkinson disease    Historical Provider, MD  atorvastatin (LIPITOR) 20 MG tablet Take 20 mg by mouth daily.    Historical Provider, MD  docusate sodium (COLACE) 100 MG capsule Take 100 mg by mouth daily. For constipation    Historical Provider, MD  galantamine (RAZADYNE ER) 8 MG 24 hr capsule Take 8 mg by mouth daily with breakfast. For alzheimer's disease    Historical Provider, MD  nitroGLYCERIN (NITROSTAT) 0.6 MG SL tablet Place 0.6 mg under the tongue every 5 (five) minutes as needed for chest pain.    Historical Provider, MD   BP 181/168 mmHg  Pulse 76  Temp(Src) 98.5 F (36.9 C)  Resp 18  Ht 5\' 4"  (1.626 m)  Wt 55.339 kg  BMI 20.93 kg/m2  SpO2 89% Physical Exam  Constitutional: He is oriented to person, place, and time. He appears well-developed and well-nourished.  HENT:  Head: Normocephalic and atraumatic.  Right Ear: External ear normal.  Left Ear: External ear normal.  Nose: Nose normal.  Mouth/Throat: Oropharynx is clear and moist.  Eyes: Conjunctivae and EOM are normal. Pupils are equal, round, and reactive to  light.  Neck: Normal range of motion.  Cardiovascular: Normal rate and normal heart sounds.   Pulmonary/Chest: Effort normal and breath sounds normal.  Abdominal: Soft. He exhibits no distension.  Musculoskeletal: Normal range of motion.  Neurological: He is alert and oriented to person, place, and time.  Skin: Skin is warm.  Psychiatric: He has a normal mood and affect.  Nursing note and vitals reviewed.   ED Course  Procedures (including critical care time) Labs Review Labs Reviewed  URINALYSIS, ROUTINE W REFLEX MICROSCOPIC (NOT AT [facility]) - Abnormal; Notable for the following:    Hgb urine dipstick TRACE (*)    All other components  within normal limits  COMPREHENSIVE METABOLIC PANEL - Abnormal; Notable for the following:    Glucose, Bld 113 (*)    Total Protein 5.7 (*)    Albumin 3.3 (*)    All other components within normal limits  CBC - Abnormal; Notable for the following:    RBC 3.75 (*)    Hemoglobin 12.4 (*)    HCT 37.3 (*)    All other components within normal limits  URINE MICROSCOPIC-ADD ON - Abnormal; Notable for the following:    Squamous Epithelial / LPF 0-5 (*)    Bacteria, UA FEW (*)    All other components within normal limits  TROPONIN I  Rosezena Sensor, ED    Imaging Review Dg Chest 2 View  08/30/2015  CLINICAL DATA:  Chest pain today. EXAM: CHEST  2 VIEW COMPARISON:  07/23/2015 FINDINGS: The cardiac silhouette, mediastinal and hilar contours are within normal limits and stable. There is marked tortuosity, ectasia and calcification of the thoracic aorta. Chronic lung changes with emphysema and pulmonary scarring. Chronic changes of interstitial pulmonary fibrosis at the lung bases. No pleural effusions or acute overlying infiltrate. The bony thorax is intact. Remote T12 compression fracture. IMPRESSION: Chronic lung changes.  No acute pulmonary findings. Electronically Signed   By: Rudie Meyer M.D.   On: 08/30/2015 17:58   I have personally reviewed and  evaluated these images and lab results as part of my medical decision-making.   EKG Interpretation None     Normal sinus  Rate of 89.   Right bundle Branch block.  No st cahnges, normal qrs,  No cahnge compared with previous EKG's.  MDM  Pt has been pain free in ED.  Troponin negative x 2  Pt wants to return home.      Final diagnoses:  Chest pain, unspecified chest pain type   An After Visit Summary was printed and given to the patient.  Marland Kitchenm  Lonia Skinner [place], PA-C 08/30/15 1827  Bethann Berkshire, MD 09/01/15 1536

## 2015-08-30 NOTE — ED Notes (Signed)
Pt was attempting to go to Physical therapy for the first time.  He told the therapist that he was having CP and left arm numbness.  When EMS arrived he told them that he was just saying that to get out of PT. He also stated that left arm and hand numbness is normal for him b/c of previous injury to that arm. He also stated that he was nauseous and that he had alzheimer's. Vitals signs are as follows: BP: 135/78 HR: 90 Resp: 16 SPO2: 96% and CBG: 124

## 2015-08-30 NOTE — ED Notes (Signed)
NAD at this time. Pt is stable and waiting in the lobby for daughter to pick him up.

## 2015-08-30 NOTE — Discharge Instructions (Signed)
Nonspecific Chest Pain  °Chest pain can be caused by many different conditions. There is always a chance that your pain could be related to something serious, such as a heart attack or a blood clot in your lungs. Chest pain can also be caused by conditions that are not life-threatening. If you have chest pain, it is very important to follow up with your health care provider. °CAUSES  °Chest pain can be caused by: °· Heartburn. °· Pneumonia or bronchitis. °· Anxiety or stress. °· Inflammation around your heart (pericarditis) or lung (pleuritis or pleurisy). °· A blood clot in your lung. °· A collapsed lung (pneumothorax). It can develop suddenly on its own (spontaneous pneumothorax) or from trauma to the chest. °· Shingles infection (varicella-zoster virus). °· Heart attack. °· Damage to the bones, muscles, and cartilage that make up your chest wall. This can include: °¨ Bruised bones due to injury. °¨ Strained muscles or cartilage due to frequent or repeated coughing or overwork. °¨ Fracture to one or more ribs. °¨ Sore cartilage due to inflammation (costochondritis). °RISK FACTORS  °Risk factors for chest pain may include: °· Activities that increase your risk for trauma or injury to your chest. °· Respiratory infections or conditions that cause frequent coughing. °· Medical conditions or overeating that can cause heartburn. °· Heart disease or family history of heart disease. °· Conditions or health behaviors that increase your risk of developing a blood clot. °· Having had chicken pox (varicella zoster). °SIGNS AND SYMPTOMS °Chest pain can feel like: °· Burning or tingling on the surface of your chest or deep in your chest. °· Crushing, pressure, aching, or squeezing pain. °· Dull or sharp pain that is worse when you move, cough, or take a deep breath. °· Pain that is also felt in your back, neck, shoulder, or arm, or pain that spreads to any of these areas. °Your chest pain may come and go, or it may stay  constant. °DIAGNOSIS °Lab tests or other studies may be needed to find the cause of your pain. Your health care provider may have you take a test called an ambulatory ECG (electrocardiogram). An ECG records your heartbeat patterns at the time the test is performed. You may also have other tests, such as: °· Transthoracic echocardiogram (TTE). During echocardiography, sound waves are used to create a picture of all of the heart structures and to look at how blood flows through your heart. °· Transesophageal echocardiogram (TEE). This is a more advanced imaging test that obtains images from inside your body. It allows your health care provider to see your heart in finer detail. °· Cardiac monitoring. This allows your health care provider to monitor your heart rate and rhythm in real time. °· Holter monitor. This is a portable device that records your heartbeat and can help to diagnose abnormal heartbeats. It allows your health care provider to track your heart activity for several days, if needed. °· Stress tests. These can be done through exercise or by taking medicine that makes your heart beat more quickly. °· Blood tests. °· Imaging tests. °TREATMENT  °Your treatment depends on what is causing your chest pain. Treatment may include: °· Medicines. These may include: °¨ Acid blockers for heartburn. °¨ Anti-inflammatory medicine. °¨ Pain medicine for inflammatory conditions. °¨ Antibiotic medicine, if an infection is present. °¨ Medicines to dissolve blood clots. °¨ Medicines to treat coronary artery disease. °· Supportive care for conditions that do not require medicines. This may include: °¨ Resting. °¨ Applying heat   or cold packs to injured areas. °¨ Limiting activities until pain decreases. °HOME CARE INSTRUCTIONS °· If you were prescribed an antibiotic medicine, finish it all even if you start to feel better. °· Avoid any activities that bring on chest pain. °· Do not use any tobacco products, including  cigarettes, chewing tobacco, or electronic cigarettes. If you need help quitting, ask your health care provider. °· Do not drink alcohol. °· Take medicines only as directed by your health care provider. °· Keep all follow-up visits as directed by your health care provider. This is important. This includes any further testing if your chest pain does not go away. °· If heartburn is the cause for your chest pain, you may be told to keep your head raised (elevated) while sleeping. This reduces the chance that acid will go from your stomach into your esophagus. °· Make lifestyle changes as directed by your health care provider. These may include: °¨ Getting regular exercise. Ask your health care provider to suggest some activities that are safe for you. °¨ Eating a heart-healthy diet. A registered dietitian can help you to learn healthy eating options. °¨ Maintaining a healthy weight. °¨ Managing diabetes, if necessary. °¨ Reducing stress. °SEEK MEDICAL CARE IF: °· Your chest pain does not go away after treatment. °· You have a rash with blisters on your chest. °· You have a fever. °SEEK IMMEDIATE MEDICAL CARE IF:  °· Your chest pain is worse. °· You have an increasing cough, or you cough up blood. °· You have severe abdominal pain. °· You have severe weakness. °· You faint. °· You have chills. °· You have sudden, unexplained chest discomfort. °· You have sudden, unexplained discomfort in your arms, back, neck, or jaw. °· You have shortness of breath at any time. °· You suddenly start to sweat, or your skin gets clammy. °· You feel nauseous or you vomit. °· You suddenly feel light-headed or dizzy. °· Your heart begins to beat quickly, or it feels like it is skipping beats. °These symptoms may represent a serious problem that is an emergency. Do not wait to see if the symptoms will go away. Get medical help right away. Call your local emergency services (911 in the [place] Do not drive yourself to the hospital. °  °This  information is not intended to replace advice given to you by your health care provider. Make sure you discuss any questions you have with your health care provider. °  °Document Released: 03/12/2005 Document Revised: 06/23/2014 Document Reviewed: 01/06/2014 °[employer] Interactive Patient Education ©2016 [employer] ° °

## 2016-07-17 ENCOUNTER — Other Ambulatory Visit: Payer: Self-pay

## 2016-07-17 ENCOUNTER — Emergency Department (HOSPITAL_COMMUNITY): Payer: Medicare Other

## 2016-07-17 ENCOUNTER — Observation Stay (HOSPITAL_COMMUNITY)
Admission: EM | Admit: 2016-07-17 | Discharge: 2016-07-18 | Disposition: A | Discharge status: Home / Self Care | Payer: Medicare Other | Attending: Internal Medicine | Admitting: Internal Medicine

## 2016-07-17 ENCOUNTER — Encounter (HOSPITAL_COMMUNITY): Payer: Self-pay

## 2016-07-17 DIAGNOSIS — E7800 Pure hypercholesterolemia, unspecified: Secondary | ICD-10-CM | POA: Insufficient documentation

## 2016-07-17 DIAGNOSIS — N400 Enlarged prostate without lower urinary tract symptoms: Secondary | ICD-10-CM | POA: Diagnosis not present

## 2016-07-17 DIAGNOSIS — F419 Anxiety disorder, unspecified: Secondary | ICD-10-CM | POA: Diagnosis not present

## 2016-07-17 DIAGNOSIS — I10 Essential (primary) hypertension: Secondary | ICD-10-CM | POA: Insufficient documentation

## 2016-07-17 DIAGNOSIS — E785 Hyperlipidemia, unspecified: Secondary | ICD-10-CM | POA: Insufficient documentation

## 2016-07-17 DIAGNOSIS — I252 Old myocardial infarction: Secondary | ICD-10-CM | POA: Diagnosis not present

## 2016-07-17 DIAGNOSIS — Z87891 Personal history of nicotine dependence: Secondary | ICD-10-CM | POA: Insufficient documentation

## 2016-07-17 DIAGNOSIS — R0789 Other chest pain: Principal | ICD-10-CM | POA: Insufficient documentation

## 2016-07-17 DIAGNOSIS — Z882 Allergy status to sulfonamides status: Secondary | ICD-10-CM | POA: Insufficient documentation

## 2016-07-17 DIAGNOSIS — I700 Atherosclerosis of aorta: Secondary | ICD-10-CM | POA: Diagnosis not present

## 2016-07-17 DIAGNOSIS — G309 Alzheimer's disease, unspecified: Secondary | ICD-10-CM | POA: Insufficient documentation

## 2016-07-17 DIAGNOSIS — F259 Schizoaffective disorder, unspecified: Secondary | ICD-10-CM | POA: Diagnosis not present

## 2016-07-17 DIAGNOSIS — F319 Bipolar disorder, unspecified: Secondary | ICD-10-CM

## 2016-07-17 DIAGNOSIS — Z885 Allergy status to narcotic agent status: Secondary | ICD-10-CM | POA: Diagnosis not present

## 2016-07-17 DIAGNOSIS — Z7982 Long term (current) use of aspirin: Secondary | ICD-10-CM | POA: Insufficient documentation

## 2016-07-17 DIAGNOSIS — F0281 Dementia in other diseases classified elsewhere with behavioral disturbance: Secondary | ICD-10-CM | POA: Insufficient documentation

## 2016-07-17 DIAGNOSIS — I714 Abdominal aortic aneurysm, without rupture: Secondary | ICD-10-CM | POA: Diagnosis not present

## 2016-07-17 DIAGNOSIS — G20 Parkinson's disease: Secondary | ICD-10-CM | POA: Diagnosis not present

## 2016-07-17 DIAGNOSIS — Z880 Allergy status to penicillin: Secondary | ICD-10-CM | POA: Insufficient documentation

## 2016-07-17 DIAGNOSIS — F0280 Dementia in other diseases classified elsewhere without behavioral disturbance: Secondary | ICD-10-CM

## 2016-07-17 DIAGNOSIS — I2510 Atherosclerotic heart disease of native coronary artery without angina pectoris: Secondary | ICD-10-CM | POA: Insufficient documentation

## 2016-07-17 DIAGNOSIS — K219 Gastro-esophageal reflux disease without esophagitis: Secondary | ICD-10-CM | POA: Diagnosis not present

## 2016-07-17 DIAGNOSIS — Z8249 Family history of ischemic heart disease and other diseases of the circulatory system: Secondary | ICD-10-CM | POA: Insufficient documentation

## 2016-07-17 DIAGNOSIS — R079 Chest pain, unspecified: Secondary | ICD-10-CM | POA: Diagnosis not present

## 2016-07-17 DIAGNOSIS — I517 Cardiomegaly: Secondary | ICD-10-CM | POA: Diagnosis not present

## 2016-07-17 LAB — CBC WITH DIFFERENTIAL/PLATELET
BASOS PCT: 0 %
Basophils Absolute: 0 10*3/uL (ref 0.0–0.1)
EOS ABS: 0.2 10*3/uL (ref 0.0–0.7)
EOS PCT: 3 %
HCT: 34.7 % — ABNORMAL LOW (ref 39.0–52.0)
Hemoglobin: 11.3 g/dL — ABNORMAL LOW (ref 13.0–17.0)
LYMPHS ABS: 1.8 10*3/uL (ref 0.7–4.0)
Lymphocytes Relative: 26 %
MCH: 32.4 pg (ref 26.0–34.0)
MCHC: 32.6 g/dL (ref 30.0–36.0)
MCV: 99.4 fL (ref 78.0–100.0)
Monocytes Absolute: 0.5 10*3/uL (ref 0.1–1.0)
Monocytes Relative: 7 %
NEUTROS PCT: 64 %
Neutro Abs: 4.3 10*3/uL (ref 1.7–7.7)
PLATELETS: 210 10*3/uL (ref 150–400)
RBC: 3.49 MIL/uL — AB (ref 4.22–5.81)
RDW: 13.2 % (ref 11.5–15.5)
WBC: 6.8 10*3/uL (ref 4.0–10.5)

## 2016-07-17 LAB — BASIC METABOLIC PANEL
Anion gap: 7 (ref 5–15)
BUN: 29 mg/dL — AB (ref 6–20)
CALCIUM: 8.4 mg/dL — AB (ref 8.9–10.3)
CHLORIDE: 109 mmol/L (ref 101–111)
CO2: 23 mmol/L (ref 22–32)
CREATININE: 1.01 mg/dL (ref 0.61–1.24)
GFR calc Af Amer: >60 mL/min (ref >60)
GFR calc non Af Amer: >60 mL/min (ref >60)
Glucose, Bld: 101 mg/dL — ABNORMAL HIGH (ref 65–99)
Potassium: 4.1 mmol/L (ref 3.5–5.1)
SODIUM: 139 mmol/L (ref 135–145)

## 2016-07-17 LAB — TSH: TSH: 2.505 u[IU]/mL (ref 0.350–4.500)

## 2016-07-17 LAB — TROPONIN I

## 2016-07-17 LAB — PHOSPHORUS: Phosphorus: 3.4 mg/dL (ref 2.5–4.6)

## 2016-07-17 LAB — I-STAT TROPONIN, ED: TROPONIN I, POC: 0 ng/mL (ref 0.00–0.08)

## 2016-07-17 LAB — BRAIN NATRIURETIC PEPTIDE: B Natriuretic Peptide: 29.3 pg/mL (ref 0.0–100.0)

## 2016-07-17 LAB — MAGNESIUM: MAGNESIUM: 2.2 mg/dL (ref 1.7–2.4)

## 2016-07-17 MED ORDER — METOPROLOL TARTRATE 25 MG PO TABS
25.0000 mg | ORAL_TABLET | Freq: Two times a day (BID) | ORAL | Status: DC
  Administered 2016-07-17 – 2016-07-18 (×2): 25 mg via ORAL
  Filled 2016-07-17 (×2): qty 1

## 2016-07-17 MED ORDER — ASPIRIN EC 81 MG PO TBEC
81.0000 mg | DELAYED_RELEASE_TABLET | Freq: Every day | ORAL | Status: DC
  Administered 2016-07-18: 81 mg via ORAL
  Filled 2016-07-17: qty 1

## 2016-07-17 MED ORDER — PANTOPRAZOLE SODIUM 40 MG PO TBEC
40.0000 mg | DELAYED_RELEASE_TABLET | Freq: Every day | ORAL | Status: DC
  Administered 2016-07-18: 40 mg via ORAL
  Filled 2016-07-17: qty 1

## 2016-07-17 MED ORDER — NITROGLYCERIN 0.4 MG SL SUBL: 0.4000 mg | SUBLINGUAL_TABLET | SUBLINGUAL | Status: DC | PRN

## 2016-07-17 MED ORDER — HEPARIN SODIUM (PORCINE) 5000 UNIT/ML IJ SOLN
5000.0000 [IU] | Freq: Three times a day (TID) | INTRAMUSCULAR | Status: DC
  Administered 2016-07-17 – 2016-07-18 (×2): 5000 [IU] via SUBCUTANEOUS
  Filled 2016-07-17 (×2): qty 1

## 2016-07-17 MED ORDER — LORATADINE 10 MG PO TABS
10.0000 mg | ORAL_TABLET | Freq: Every day | ORAL | Status: DC
  Administered 2016-07-18: 10 mg via ORAL
  Filled 2016-07-17: qty 1

## 2016-07-17 MED ORDER — ISOSORBIDE MONONITRATE ER 30 MG PO TB24
30.0000 mg | ORAL_TABLET | Freq: Every day | ORAL | Status: DC
  Administered 2016-07-18: 30 mg via ORAL
  Filled 2016-07-17: qty 1

## 2016-07-17 MED ORDER — SENNA 8.6 MG PO TABS: 1.0000 | ORAL_TABLET | Freq: Every day | ORAL | Status: DC | PRN

## 2016-07-17 MED ORDER — ACETAMINOPHEN 325 MG PO TABS
650.0000 mg | ORAL_TABLET | ORAL | Status: DC | PRN
  Administered 2016-07-18: 650 mg via ORAL
  Filled 2016-07-17: qty 2

## 2016-07-17 MED ORDER — LORAZEPAM 1 MG PO TABS: 1.0000 mg | ORAL_TABLET | Freq: Two times a day (BID) | ORAL | Status: DC | PRN

## 2016-07-17 MED ORDER — PSYLLIUM 95 % PO PACK: 1.0000 | PACK | Freq: Every day | ORAL | Status: DC

## 2016-07-17 MED ORDER — GALANTAMINE HYDROBROMIDE ER 8 MG PO CP24
8.0000 mg | ORAL_CAPSULE | Freq: Every day | ORAL | Status: DC
  Filled 2016-07-17 (×2): qty 1

## 2016-07-17 MED ORDER — TRAMADOL HCL 50 MG PO TABS
50.0000 mg | ORAL_TABLET | Freq: Two times a day (BID) | ORAL | Status: DC | PRN
  Administered 2016-07-17: 50 mg via ORAL
  Filled 2016-07-17: qty 1

## 2016-07-17 MED ORDER — TAMSULOSIN HCL 0.4 MG PO CAPS: 0.4000 mg | ORAL_CAPSULE | Freq: Every day | ORAL | Status: DC

## 2016-07-17 MED ORDER — ACETAMINOPHEN 325 MG PO TABS: 650.0000 mg | ORAL_TABLET | ORAL | Status: DC | PRN

## 2016-07-17 MED ORDER — PHENAZOPYRIDINE HCL 100 MG PO TABS
100.0000 mg | ORAL_TABLET | Freq: Three times a day (TID) | ORAL | Status: DC
  Filled 2016-07-17: qty 1

## 2016-07-17 MED ORDER — POLYVINYL ALCOHOL 1.4 % OP SOLN
1.0000 [drp] | Freq: Two times a day (BID) | OPHTHALMIC | Status: DC
  Filled 2016-07-17: qty 15

## 2016-07-17 MED ORDER — ONDANSETRON HCL 4 MG/2ML IJ SOLN: 4.0000 mg | Freq: Four times a day (QID) | INTRAMUSCULAR | Status: DC | PRN

## 2016-07-17 MED ORDER — LAMOTRIGINE 25 MG PO TABS
100.0000 mg | ORAL_TABLET | Freq: Two times a day (BID) | ORAL | Status: DC
  Administered 2016-07-17: 100 mg via ORAL
  Filled 2016-07-17: qty 4

## 2016-07-17 MED ORDER — FOLIC ACID 1 MG PO TABS
1.0000 mg | ORAL_TABLET | Freq: Every day | ORAL | Status: DC
Start: 2016-07-18 — End: 2016-07-18
  Administered 2016-07-18: 1 mg via ORAL
  Filled 2016-07-17: qty 1

## 2016-07-17 MED ORDER — ATORVASTATIN CALCIUM 20 MG PO TABS
20.0000 mg | ORAL_TABLET | Freq: Every day | ORAL | Status: DC
  Administered 2016-07-18: 20 mg via ORAL
  Filled 2016-07-17: qty 1

## 2016-07-17 MED ORDER — SODIUM CHLORIDE 0.9 % IV BOLUS (SEPSIS)
500.0000 mL | Freq: Once | INTRAVENOUS | Status: AC
  Administered 2016-07-17: 500 mL via INTRAVENOUS

## 2016-07-17 NOTE — ED Provider Notes (Signed)
[facility]-EMERGENCY DEPT Provider Note   CSN: [id number] Arrival date & time: 07/17/16  1435     History   Chief Complaint Chief Complaint  Patient presents with  . Chest Pain    HPI Andre Marsh is a 83 y.o. male.  The history is provided by the patient.  Chest Pain   This is a new problem. The current episode started yesterday. The problem occurs constantly. The problem has not changed since onset.The pain is associated with rest. The pain is present in the lateral region. The pain is at a severity of 2/10 (7/10 at worst, now 2/10 after 2 nitro). Quality: dull with intermittent sharp pain. The pain does not radiate. Associated symptoms include back pain (chronic), nausea and shortness of breath. Pertinent negatives include no abdominal pain, no cough, no diaphoresis, no exertional chest pressure, no fever, no lower extremity edema, no near-syncope, no numbness, no sputum production, no vomiting and no weakness. He has tried nitroglycerin for the symptoms. The treatment provided moderate relief. Risk factors include being elderly and sedentary lifestyle.  His past medical history is significant for CAD (pt states he has had a MI and takes ASA dialy and prescribed nitro, however no cath's in our system. Denies any stent placement or CABG), hyperlipidemia and hypertension.     Past Medical History:  Diagnosis Date  . AAA (abdominal aortic aneurysm) (HCC)   . Alzheimer disease   . Anxiety   . Bipolar 1 disorder (HCC)   . CAD (coronary artery disease)    a. patient reported nonobstructive CAD. b. patient reported cath 2014 @ [facility]  . Cataract    bilateral  . Depression   . Fall   . GERD (gastroesophageal reflux disease)   . Hyperlipidemia   . Hypertension   . Paralysis agitans [facility](HCC)     Patient Active Problem List   Diagnosis Date Noted  . D-dimer, elevated 07/29/2015  . BPH (benign prostatic hypertrophy) 07/29/2015  . Headache 07/23/2015  . Psychosis 06/13/2015  . Cerumen  debris on tympanic membrane 04/25/2015  . Cough 04/08/2015  . Rib pain 03/01/2015  . SOB (shortness of breath) 01/06/2015  . Schizoaffective disorder (HCC) 11/17/2014  . Back pain 06/29/2014  . Chest pain 02/08/2014  . Bradycardia 02/03/2014  . Near syncope 02/01/2014  . Hypotension 02/01/2014  . Dementia with behavioral disturbance 01/24/2014  . AAA (abdominal aortic aneurysm) without rupture (HCC) 10/19/2013  . Pneumonia 09/28/2013  . Bipolar disorder (HCC) 02/04/2013  . GERD (gastroesophageal reflux disease) 02/04/2013  . Hyperglycemia 12/13/2012  . Unspecified constipation 10/22/2012  . Pure hypercholesterolemia 10/22/2012  . Essential hypertension, benign 10/22/2012  . Allergic rhinitis 10/22/2012    Past Surgical History:  Procedure Laterality Date  . CATARACT EXTRACTION Left        Home Medications    Prior to Admission medications   Medication Sig Start Date End Date Taking? Authorizing Provider  acetaminophen (TYLENOL) 325 MG tablet Take 650 mg by mouth every 4 (four) hours as needed for mild pain.    Historical Provider, MD  aspirin 325 MG EC tablet Take 325 mg by mouth daily. For parkinson disease    Historical Provider, MD  aspirin 81 MG tablet Take 81 mg by mouth daily.    Historical Provider, MD  atorvastatin (LIPITOR) 20 MG tablet Take 20 mg by mouth daily.    Historical Provider, MD  docusate sodium (COLACE) 100 MG capsule Take 100 mg by mouth daily. For constipation    Historical  Provider, MD  folic acid (FOLVITE) 1 MG tablet Take 1 mg by mouth daily.    Historical Provider, MD  galantamine (RAZADYNE ER) 8 MG 24 hr capsule Take 8 mg by mouth daily with breakfast. For alzheimer's disease    Historical Provider, MD  galantamine (RAZADYNE) 4 MG tablet Take 4 mg by mouth daily.    Historical Provider, MD  INVEGA 3 MG 24 hr tablet Take 3 mg by mouth at bedtime. For paranoia and mood 01/12/14   Historical Provider, MD  isosorbide mononitrate (IMDUR) 30 MG 24 hr  tablet Take 30 mg by mouth daily. For heart disease    Historical Provider, MD  lamoTRIgine (LAMICTAL) 100 MG tablet Take 100 mg by mouth 2 (two) times daily. For bipolar disorder    Historical Provider, MD  loratadine (CLARITIN) 10 MG tablet Take 10 mg by mouth daily. For allergies    Historical Provider, MD  LORazepam (ATIVAN) 0.5 MG tablet Take 1 tablet (0.5 mg total) by mouth every 12 (twelve) hours as needed for anxiety. Patient taking differently: Take 1 mg by mouth every 12 (twelve) hours as needed for anxiety.  05/15/15   Sharon Seller, NP  nitroGLYCERIN (NITROSTAT) 0.6 MG SL tablet Place 0.6 mg under the tongue every 5 (five) minutes as needed for chest pain.    Historical Provider, MD  omeprazole (PRILOSEC) 20 MG capsule Take 20 mg by mouth 2 (two) times daily. For reflux    Historical Provider, MD  phenazopyridine (PYRIDIUM) 100 MG tablet Take 100 mg by mouth 3 (three) times daily with meals. For enlarged prostate    Historical Provider, MD  Propylene Glycol (SYSTANE BALANCE) 0.6 % SOLN Apply 1 drop to eye 2 (two) times daily.    Historical Provider, MD  psyllium (METAMUCIL SMOOTH TEXTURE) 28 % packet Take 1 packet by mouth at bedtime.    Historical Provider, MD  senna (SENOKOT) 8.6 MG TABS tablet Take 1 tablet by mouth daily as needed for mild constipation.    Historical Provider, MD  senna-docusate (SENOKOT-S) 8.6-50 MG per tablet Take 1 tablet by mouth daily.     Historical Provider, MD  tamsulosin (FLOMAX) 0.4 MG CAPS capsule Take 0.4 mg by mouth daily after supper. For enlarged prostate    Historical Provider, MD  traMADol (ULTRAM) 50 MG tablet Take one tablet by mouth every 8 hours as needed for pain Patient taking differently: 50 mg every 12 (twelve) hours as needed. Take one tablet by mouth every 8 hours as needed for pain 05/15/15   Sharon Seller, NP  zolpidem (AMBIEN) 5 MG tablet Take 1 tablet (5 mg total) by mouth at bedtime as needed for sleep. 05/02/15   Kimber Relic,  MD    Family History Family History  Problem Relation Age of Onset  . Heart failure Mother     Social History Social History  Substance Use Topics  . Smoking status: Never Smoker  . Smokeless tobacco: Never Used  . Alcohol use Yes     Comment: wine occasionally     Allergies   Ciprofloxacin hcl; Cocoa; Codeine; Hydrocodone; Librax [chlordiazepoxide-clidinium]; Loperamide hcl; Metaxalone; Penicillins; Sulfa antibiotics; and Tetracyclines & related   Review of Systems Review of Systems  Constitutional: Negative for diaphoresis and fever.  HENT: Negative.   Eyes: Negative.   Respiratory: Positive for shortness of breath. Negative for cough and sputum production.   Cardiovascular: Positive for chest pain. Negative for near-syncope.  Gastrointestinal: Positive for nausea. Negative for  abdominal pain, constipation, diarrhea and vomiting.  Genitourinary: Negative.   Musculoskeletal: Positive for back pain (chronic). Negative for neck pain.  Neurological: Negative.  Negative for weakness and numbness.  All other systems reviewed and are negative.    Physical Exam Updated Vital Signs BP 140/55   Pulse 68   Temp 98.3 F (36.8 C) (Oral)   Resp 18   Ht 5\' 2"  (1.575 m)   Wt 54.4 kg   SpO2 96%   BMI 21.95 kg/m   Physical Exam  Constitutional: He appears well-developed and well-nourished. No distress.  HENT:  Head: Normocephalic and atraumatic.  Eyes: Conjunctivae are normal.  Neck: Normal range of motion. Neck supple.  Cardiovascular: Normal rate, regular rhythm, S1 normal, S2 normal, normal heart sounds, intact distal pulses and normal pulses.   No murmur heard. Pulmonary/Chest: Effort normal and breath sounds normal. No respiratory distress. He has no decreased breath sounds. He has no rhonchi.  Abdominal: Soft. He exhibits no distension. There is no tenderness.  Musculoskeletal: He exhibits no edema.  Neurological: He is alert.  Skin: Skin is warm and dry.  Capillary refill takes less than 2 seconds. He is not diaphoretic.  Psychiatric: He has a normal mood and affect.  Nursing note and vitals reviewed.    ED Treatments / Results  Labs (all labs ordered are listed, but only abnormal results are displayed) Labs Reviewed  BASIC METABOLIC PANEL - Abnormal; Notable for the following:       Result Value   Glucose, Bld 101 (*)    BUN 29 (*)    Calcium 8.4 (*)    All other components within normal limits  CBC WITH DIFFERENTIAL/PLATELET - Abnormal; Notable for the following:    RBC 3.49 (*)    Hemoglobin 11.3 (*)    HCT 34.7 (*)    All other components within normal limits  I-STAT TROPOININ, ED    EKG  EKG Interpretation  Date/Time:  Thursday July 17 2016 14:34:23 EST Ventricular Rate:  69 PR Interval:  130 QRS Duration: 122 QT Interval:  432 QTC Calculation: 462 R Axis:   67 Text Interpretation:  Normal sinus rhythm RSR' or QR pattern in V1 suggests right ventricular conduction delay Septal infarct , age undetermined Abnormal ECG No STEMI.  Confirmed by LONG MD, JOSHUA [phone]) on 07/17/2016 2:51:44 PM       Radiology Dg Chest 2 View  Result Date: 07/17/2016 CLINICAL DATA:  83 y/o M; left-sided chest pain, shortness of breath, nausea. EXAM: CHEST  2 VIEW COMPARISON:  08/30/2015 chest radiograph FINDINGS: Mild cardiomegaly stable given projection and technique. Aortic atherosclerosis with arch calcification. Stable reticular opacities in lung bases probably represents fibrosis and/or scarring. No focal consolidation. No pleural effusion. Stable moderate T12 anterior compression deformity. Cholecystectomy clips. IMPRESSION: Stable mild cardiomegaly and chronic lung changes. No acute pulmonary process identified Electronically Signed   By: Mitzi Hansen M.D.   On: 07/17/2016 15:56    Procedures Procedures (including critical care time)  Medications Ordered in ED Medications  sodium chloride 0.9 % bolus 500 mL (0 mLs  Intravenous Stopped 07/17/16 1516)     Initial Impression / Assessment and Plan / ED Course  I have reviewed the triage vital signs and the nursing notes.  Pertinent labs & imaging results that were available during my care of the patient were reviewed by me and considered in my medical decision making (see chart for details).    83 year old male with a documented history of  CAD presenting with left-sided dull chest pain with intermittent sharp pains associated with shortness of breath and nausea since yesterday. Pain relieved with nitroglycerin. Patient resting, and with this time. He is afebrile and hemodynamically stable. EKG with a normal sinus rhythm and no ST or T-wave changes. Labs grossly unremarkable and troponin is negative. Chest x-ray was stable cardiomegaly but no acute changes. No signs of pneumonia or pneumothorax on chest x-ray. No widened mediastinum, equal pulses bilaterally without hypertension, doubt dissection. No acidosis or significant anemia on labs Heart score 4. As patient is high risk for ACS, patient will be admitted to the hospitalist service for ACS rule out.  Patient care discussed and supervised by my attending, Dr. Jeraldine Loots. Azalia Bilis, MD   Final Clinical Impressions(s) / ED Diagnoses   Final diagnoses:  Chest pain, unspecified type    New Prescriptions New Prescriptions   No medications on file     Junah Yam [place] Piper [place], MD 07/17/16 1803    Gerhard Munch, MD 07/17/16 2342

## 2016-07-17 NOTE — Consult Note (Signed)
CARDIOLOGY CONSULT NOTE  Patient ID: Andre Marsh MRN: [id number] DOB/AGE: 1927/01/15 83 y.o.  Admit date: 07/17/2016 Primary Physician Angela Cox, MD Primary Cardiologist None (Saw [facility] in the past) Chief Complaint  Chest pain Requesting  ED  HPI:  The patient presented to the ED from a nursing home.  He reports chest pain off and on for days.  He has a difficult time relating the details.  He has left pain under his left breast.  He says that it is sharp and that it would last for about an hour.  However, he told staff and ER providers that it was lasting longer than this.  He did not report that it radiated.  He might have had SOB with this.  He did not have other associated symptoms such as nausea or vomiting.  It is not clear whether this was similar to other pain. He was treated with ASA and NTG. There was some improvement in his pain but it did not go away. I see reports that it went from a 7 to a 2 although he is pain free now.   Initial enzyme was negative.  EKG demonstrates no acute changes.     The patient was seen by Dr. Patty Sermons in 2015 for near syncope.  Echo demonstrated normal EF.  No significant abnormalities.  CTA of the chest without PE, some atherosclerotic calcifications of thoracic aorta and coronary arteries noted.  There was no etiology for the event identified as far as I could tell.  He had a reported history of nonobstructive CAD although I cannot find this documented.  He has a history of chest pain and was followed by Dr. Lisbeth Ply in [place].    Past Medical History:  Diagnosis Date  . AAA (abdominal aortic aneurysm) (HCC)   . Alzheimer disease   . Anxiety   . Bipolar 1 disorder (HCC)   . CAD (coronary artery disease)    a. patient reported nonobstructive CAD. b. patient reported cath 2014 @ [facility]  . Cataract    bilateral  . Depression   . Fall   . GERD (gastroesophageal reflux disease)   . Hyperlipidemia   . Hypertension   . Paralysis [facility]ans [facility])      Past Surgical History:  Procedure Laterality Date  . CATARACT EXTRACTION Left     Allergies  Allergen Reactions  . Ciprofloxacin Hcl Nausea And Vomiting  . Cocoa Other (See Comments)    Unknown; listed in MAR  . Codeine Other (See Comments)    Unknown; listed in MAR  . Hydrocodone Other (See Comments)    Unknown; listed in MAR  . Librax [Chlordiazepoxide-Clidinium] Other (See Comments)    Unknown; listed in MAR  . Loperamide Hcl Other (See Comments)    Unknown; listed in MAR  . Metaxalone Other (See Comments)    Unknown; listed in MAR  . Penicillins Other (See Comments)    Unknown; listed in MAR  . Sulfa Antibiotics Nausea And Vomiting and Rash  . Tetracyclines & Related Rash   Prior to Admission medications   Medication Sig Start Date End Date Taking? Authorizing Provider  acetaminophen (TYLENOL) 325 MG tablet Take 650 mg by mouth every 4 (four) hours as needed for mild pain.    Historical Provider, MD  aspirin 325 MG EC tablet Take 325 mg by mouth daily. For parkinson disease    Historical Provider, MD  aspirin 81 MG tablet Take 81 mg by mouth daily.  Historical Provider, MD  atorvastatin (LIPITOR) 20 MG tablet Take 20 mg by mouth daily.    Historical Provider, MD  docusate sodium (COLACE) 100 MG capsule Take 100 mg by mouth daily. For constipation    Historical Provider, MD  folic acid (FOLVITE) 1 MG tablet Take 1 mg by mouth daily.    Historical Provider, MD  galantamine (RAZADYNE ER) 8 MG 24 hr capsule Take 8 mg by mouth daily with breakfast. For alzheimer's disease    Historical Provider, MD  galantamine (RAZADYNE) 4 MG tablet Take 4 mg by mouth daily.    Historical Provider, MD  INVEGA 3 MG 24 hr tablet Take 3 mg by mouth at bedtime. For paranoia and mood 01/12/14   Historical Provider, MD  isosorbide mononitrate (IMDUR) 30 MG 24 hr tablet Take 30 mg by mouth daily. For heart disease    Historical Provider, MD  lamoTRIgine (LAMICTAL) 100 MG tablet Take 100 mg by mouth  2 (two) times daily. For bipolar disorder    Historical Provider, MD  loratadine (CLARITIN) 10 MG tablet Take 10 mg by mouth daily. For allergies    Historical Provider, MD  LORazepam (ATIVAN) 0.5 MG tablet Take 1 tablet (0.5 mg total) by mouth every 12 (twelve) hours as needed for anxiety. Patient taking differently: Take 1 mg by mouth every 12 (twelve) hours as needed for anxiety.  05/15/15   Sharon Seller, NP  nitroGLYCERIN (NITROSTAT) 0.6 MG SL tablet Place 0.6 mg under the tongue every 5 (five) minutes as needed for chest pain.    Historical Provider, MD  omeprazole (PRILOSEC) 20 MG capsule Take 20 mg by mouth 2 (two) times daily. For reflux    Historical Provider, MD  phenazopyridine (PYRIDIUM) 100 MG tablet Take 100 mg by mouth 3 (three) times daily with meals. For enlarged prostate    Historical Provider, MD  Propylene Glycol (SYSTANE BALANCE) 0.6 % SOLN Apply 1 drop to eye 2 (two) times daily.    Historical Provider, MD  psyllium (METAMUCIL SMOOTH TEXTURE) 28 % packet Take 1 packet by mouth at bedtime.    Historical Provider, MD  senna (SENOKOT) 8.6 MG TABS tablet Take 1 tablet by mouth daily as needed for mild constipation.    Historical Provider, MD  senna-docusate (SENOKOT-S) 8.6-50 MG per tablet Take 1 tablet by mouth daily.     Historical Provider, MD  tamsulosin (FLOMAX) 0.4 MG CAPS capsule Take 0.4 mg by mouth daily after supper. For enlarged prostate    Historical Provider, MD  traMADol (ULTRAM) 50 MG tablet Take one tablet by mouth every 8 hours as needed for pain Patient taking differently: 50 mg every 12 (twelve) hours as needed. Take one tablet by mouth every 8 hours as needed for pain 05/15/15   Sharon Seller, NP  zolpidem (AMBIEN) 5 MG tablet Take 1 tablet (5 mg total) by mouth at bedtime as needed for sleep. 05/02/15   Kimber Relic, MD    (Not in a hospital admission) Family History  Problem Relation Age of Onset  . Heart failure Mother   . Pulmonary embolism  Father     Social History   Social History  . Marital status: Widowed    Spouse name: N/A  . Number of children: N/A  . Years of education: N/A   Occupational History  . Not on file.   Social History Main Topics  . Smoking status: Never Smoker  . Smokeless tobacco: Never Used  . Alcohol use  Yes     Comment: wine occasionally  . Drug use: Unknown  . Sexual activity: Not on file   Other Topics Concern  . Not on file   Social History Narrative   Lives in a nursing home.  Has a daughter and a son.      ROS:  Positive for leg and back pain. (Compromised by memory issues.)  Negative for all other systems.  Physical Exam: Blood pressure 173/76, pulse 61, temperature 98.3 F (36.8 C), temperature source Oral, resp. rate 18, height 5\' 2"  (1.575 m), weight 120 lb (54.4 kg), SpO2 95 %.  GENERAL:  Well appearing HEENT:  Pupils equal round and reactive, fundi not visualized, oral mucosa unremarkable NECK:  No jugular venous distention, waveform within normal limits, carotid upstroke brisk and symmetric, no bruits, no thyromegaly LYMPHATICS:  No cervical, inguinal adenopathy LUNGS:  Clear to auscultation bilaterally BACK:  No CVA tenderness CHEST:  Unremarkable HEART:  PMI not displaced or sustained,S1 and S2 within normal limits, no S3, no S4, no clicks, no rubs, no murmurs ABD:  Flat, positive bowel sounds normal in frequency in pitch, no bruits, no rebound, no guarding, no midline pulsatile mass, no hepatomegaly, no splenomegaly EXT:  2 plus pulses throughout, no edema, no cyanosis no clubbing SKIN:  No rashes no nodules NEURO:  Cranial nerves II through XII grossly intact, motor grossly intact throughout PSYCH:  Cognitively intact, oriented to person place and time   Labs: Lab Results  Component Value Date   BUN 29 (H) 07/17/2016   Lab Results  Component Value Date   CREATININE 1.01 07/17/2016   Lab Results  Component Value Date   NA 139 07/17/2016   K 4.1 07/17/2016    CL 109 07/17/2016   CO2 23 07/17/2016   Lab Results  Component Value Date   WBC 6.8 07/17/2016   HGB 11.3 (L) 07/17/2016   HCT 34.7 (L) 07/17/2016   MCV 99.4 07/17/2016   PLT 210 07/17/2016    Radiology:   CXR: Stable mild cardiomegaly and chronic lung changes. No acute pulmonary process identified  EKG:   NSR, rate 69, axis WNL, intervals WNL, no acute ST T wave changes.  07/17/2016\  ASSESSMENT AND PLAN:   CHEST PAIN:   Atypical pain.  I cannot document any prior cardiac history.  I agree with observation and cycling enzymes.  If there is no EKG change in the AM and no enzyme elevation I would suggest a Lexiscan Myoview.  This could likely be done as an out patient.   HTN:    BP is labile.  It is mildly elevated now.  He will likely need med titration prior to discharge.    AAA:  I don't see any cardiac follow up or recent imaging.  This can be ordered as an outpatient.    SignedRollene Rotunda: Najee Manninen 07/17/2016, 6:32 PM

## 2016-07-17 NOTE — H&P (Signed)
History and Physical    Andre Marsh ZOX:[id number] DOB: 01/20/1927 DOA: 07/17/2016  PCP: Angela Cox, MD   Patient coming from: Assisted Living Facility  Chief Complaint: Chest Pain  HPI: Andre Marsh is a 83 y.o. male with medical history significant of HTN, HLD, AAA, Alzheimers Disease, Parkinson's, Anxiety and Depression, GERD, Hx of CAD and self reported Hx of 2 MI's by patient (questionable as he stated he had them at the ALF and never came to the hospital) and other comorbids who presented to [facility] ED with a cc of Chest Pain. Patient has a history of Alzheimer's and is not the best historian but states that the pain started yesterday at 10 AM without any exertion. When asked to describe the pain the patient states it was "hard to describe" and then stated it felt sharp. States it was a constant pain but would wax and wane and never completely went away. Stated it was not there anymore but when he came in it was a 7/10. Denied any radiation but stated associated with Nausea and Shortness of breath. When asked about if he has similar symptoms in the past he stated that it felt similar but worse this instance. Patient denied any dizziness, diaphoresis, or vomiting. [facility] was called to evaluate and admit for his Cp.   ED Course: Was given 2 Nitroglycerins, ASA and given a 500 mL bolus. Had a CXR and initial Troponin was 0.00. Cardiology was called by EDP.   Review of Systems: As per HPI otherwise 10 point review of systems negative.   Past Medical History:  Diagnosis Date  . AAA (abdominal aortic aneurysm) (HCC)   . Alzheimer disease   . Anxiety   . Bipolar 1 disorder (HCC)   . CAD (coronary artery disease)    a. patient reported nonobstructive CAD. b. patient reported cath 2014 @ [facility]  . Cataract    bilateral  . Depression   . Fall   . GERD (gastroesophageal reflux disease)   . Hyperlipidemia   . Hypertension   . Paralysis agitans [facility])     Past Surgical History:    Procedure Laterality Date  . CATARACT EXTRACTION Left    SOCIAL HISTORY Reports that he Smoked previously and quit 45 years ago. He has never used smokeless tobacco. He reports that he drinks alcohol. His drug history is not on file.  Allergies  Allergen Reactions  . Ciprofloxacin Hcl Nausea And Vomiting  . Cocoa Other (See Comments)    Unknown; listed in MAR  . Codeine Other (See Comments)    Unknown; listed in MAR  . Hydrocodone Other (See Comments)    Unknown; listed in MAR  . Librax [Chlordiazepoxide-Clidinium] Other (See Comments)    Unknown; listed in MAR  . Loperamide Hcl Other (See Comments)    Unknown; listed in MAR  . Metaxalone Other (See Comments)    Unknown; listed in MAR  . Penicillins Other (See Comments)    Unknown; listed in MAR  . Sulfa Antibiotics Nausea And Vomiting and Rash  . Tetracyclines & Related Rash    Family History  Problem Relation Age of Onset  . Heart failure Mother   -Patients Father died of a Pulmonary Embolus  Prior to Admission medications   Medication Sig Start Date End Date Taking? Authorizing Provider  acetaminophen (TYLENOL) 325 MG tablet Take 650 mg by mouth every 4 (four) hours as needed for mild pain.    Historical Provider, MD  aspirin 325  MG EC tablet Take 325 mg by mouth daily. For parkinson disease    Historical Provider, MD  aspirin 81 MG tablet Take 81 mg by mouth daily.    Historical Provider, MD  atorvastatin (LIPITOR) 20 MG tablet Take 20 mg by mouth daily.    Historical Provider, MD  docusate sodium (COLACE) 100 MG capsule Take 100 mg by mouth daily. For constipation    Historical Provider, MD  folic acid (FOLVITE) 1 MG tablet Take 1 mg by mouth daily.    Historical Provider, MD  galantamine (RAZADYNE ER) 8 MG 24 hr capsule Take 8 mg by mouth daily with breakfast. For alzheimer's disease    Historical Provider, MD  galantamine (RAZADYNE) 4 MG tablet Take 4 mg by mouth daily.    Historical Provider, MD  INVEGA 3 MG 24 hr  tablet Take 3 mg by mouth at bedtime. For paranoia and mood 01/12/14   Historical Provider, MD  isosorbide mononitrate (IMDUR) 30 MG 24 hr tablet Take 30 mg by mouth daily. For heart disease    Historical Provider, MD  lamoTRIgine (LAMICTAL) 100 MG tablet Take 100 mg by mouth 2 (two) times daily. For bipolar disorder    Historical Provider, MD  loratadine (CLARITIN) 10 MG tablet Take 10 mg by mouth daily. For allergies    Historical Provider, MD  LORazepam (ATIVAN) 0.5 MG tablet Take 1 tablet (0.5 mg total) by mouth every 12 (twelve) hours as needed for anxiety. Patient taking differently: Take 1 mg by mouth every 12 (twelve) hours as needed for anxiety.  05/15/15   Sharon SellerJessica K Eubanks, NP  nitroGLYCERIN (NITROSTAT) 0.6 MG SL tablet Place 0.6 mg under the tongue every 5 (five) minutes as needed for chest pain.    Historical Provider, MD  omeprazole (PRILOSEC) 20 MG capsule Take 20 mg by mouth 2 (two) times daily. For reflux    Historical Provider, MD  phenazopyridine (PYRIDIUM) 100 MG tablet Take 100 mg by mouth 3 (three) times daily with meals. For enlarged prostate    Historical Provider, MD  Propylene Glycol (SYSTANE BALANCE) 0.6 % SOLN Apply 1 drop to eye 2 (two) times daily.    Historical Provider, MD  psyllium (METAMUCIL SMOOTH TEXTURE) 28 % packet Take 1 packet by mouth at bedtime.    Historical Provider, MD  senna (SENOKOT) 8.6 MG TABS tablet Take 1 tablet by mouth daily as needed for mild constipation.    Historical Provider, MD  senna-docusate (SENOKOT-S) 8.6-50 MG per tablet Take 1 tablet by mouth daily.     Historical Provider, MD  tamsulosin (FLOMAX) 0.4 MG CAPS capsule Take 0.4 mg by mouth daily after supper. For enlarged prostate    Historical Provider, MD  traMADol (ULTRAM) 50 MG tablet Take one tablet by mouth every 8 hours as needed for pain Patient taking differently: 50 mg every 12 (twelve) hours as needed. Take one tablet by mouth every 8 hours as needed for pain 05/15/15   Sharon SellerJessica K  Eubanks, NP  zolpidem (AMBIEN) 5 MG tablet Take 1 tablet (5 mg total) by mouth at bedtime as needed for sleep. 05/02/15   Kimber RelicArthur G Green, MD   Physical Exam: Vitals:   07/17/16 1451 07/17/16 1452  BP: 140/55   Pulse: 68   Resp: 18   Temp: 98.3 F (36.8 C)   TempSrc: Oral   SpO2: 96%   Weight:  54.4 kg (120 lb)  Height:  5\' 2"  (1.575 m)   Constitutional: Elderly appearing Caucasian gentleman  appears calm and comfortable Eyes: Lids and conjunctivae normal, sclerae anicteric  ENMT: External Ears, Nose appear normal. Hard of hearing.  Neck: Appears normal, supple, no cervical masses, normal ROM, no appreciable thyromegaly, no visible JVD Respiratory: Diminished to auscultation bilaterally, no wheezing, rales, rhonchi or crackles. Normal respiratory effort and patient is not tachypenic. .  Cardiovascular: RRR, distant heart sounds. no murmurs / rubs / gallops. S1 and S2 auscultated. No extremity edema. Abdomen: Soft, non-tender, non-distended. No masses palpated. No appreciable hepatosplenomegaly. Bowel sounds positive x4.  GU: Deferred. Musculoskeletal: No clubbing / cyanosis of digits/nails. No joint deformity upper and lower extremities..  Skin: Had some skin tears on left leg. No induration; Warm and dry.  Neurologic: CN 2-12 grossly intact with no focal deficits. Romberg sign cerebellar reflexes not assessed.  Psychiatric: Normal judgment and insight. Alert and oriented x 3. Normal mood and appropriate affect.   Labs on Admission: I have personally reviewed following labs and imaging studies  CBC:  Recent Labs Lab 07/17/16 1515  WBC 6.8  NEUTROABS 4.3  HGB 11.3*  HCT 34.7*  MCV 99.4  PLT 210   Basic Metabolic Panel:  Recent Labs Lab 07/17/16 1515  NA 139  K 4.1  CL 109  CO2 23  GLUCOSE 101*  BUN 29*  CREATININE 1.01  CALCIUM 8.4*   GFR: Estimated Creatinine Clearance: 39.6 mL/min (by C-G formula based on SCr of 1.01 mg/dL). Liver Function Tests: No results  for input(s): AST, ALT, ALKPHOS, BILITOT, PROT, ALBUMIN in the last 168 hours. No results for input(s): LIPASE, AMYLASE in the last 168 hours. No results for input(s): AMMONIA in the last 168 hours. Coagulation Profile: No results for input(s): INR, PROTIME in the last 168 hours. Cardiac Enzymes: No results for input(s): CKTOTAL, CKMB, CKMBINDEX, TROPONINI in the last 168 hours. BNP (last 3 results) No results for input(s): PROBNP in the last 8760 hours. HbA1C: No results for input(s): HGBA1C in the last 72 hours. CBG: No results for input(s): GLUCAP in the last 168 hours. Lipid Profile: No results for input(s): CHOL, HDL, LDLCALC, TRIG, CHOLHDL, LDLDIRECT in the last 72 hours. Thyroid Function Tests: No results for input(s): TSH, T4TOTAL, FREET4, T3FREE, THYROIDAB in the last 72 hours. Anemia Panel: No results for input(s): VITAMINB12, FOLATE, FERRITIN, TIBC, IRON, RETICCTPCT in the last 72 hours. Urine analysis:    Component Value Date/Time   COLORURINE YELLOW 08/30/2015 1235   APPEARANCEUR CLEAR 08/30/2015 1235   LABSPEC 1.017 08/30/2015 1235   PHURINE 6.0 08/30/2015 1235   GLUCOSEU NEGATIVE 08/30/2015 1235   HGBUR TRACE (A) 08/30/2015 1235   BILIRUBINUR NEGATIVE 08/30/2015 1235   KETONESUR NEGATIVE 08/30/2015 1235   PROTEINUR NEGATIVE 08/30/2015 1235   UROBILINOGEN 0.2 12/16/2010 1320   NITRITE NEGATIVE 08/30/2015 1235   LEUKOCYTESUR NEGATIVE 08/30/2015 1235   Sepsis Labs: !!!!!!!!!!!!!!!!!!!!!!!!!!!!!!!!!!!!!!!!!!!! @LABRCNTIP (procalcitonin:4,lacticidven:4) )No results found for this or any previous visit (from the past 240 hour(s)).   Radiological Exams on Admission: Dg Chest 2 View  Result Date: 07/17/2016 CLINICAL DATA:  83 y/o M; left-sided chest pain, shortness of breath, nausea. EXAM: CHEST  2 VIEW COMPARISON:  08/30/2015 chest radiograph FINDINGS: Mild cardiomegaly stable given projection and technique. Aortic atherosclerosis with arch calcification. Stable  reticular opacities in lung bases probably represents fibrosis and/or scarring. No focal consolidation. No pleural effusion. Stable moderate T12 anterior compression deformity. Cholecystectomy clips. IMPRESSION: Stable mild cardiomegaly and chronic lung changes. No acute pulmonary process identified Electronically Signed   By: Buzzy Han.D.  On: 07/17/2016 15:56   EKG: Independently reviewed. Showed a Sinus Rhythm at a rate of 69. Patient had some Inverted T waves on my interpretation. RSR' Prime complex noted. No evidence of ST Elevation.   Assessment/Plan Active Problems:   Chest pain   Chest pain in adult   Chest Pain r/o ACS -Admit to Tele Obs -CP was relieved with NTG -Cycle Troponin I x3 -Repeat EKG in AM -Check Mag, Phos, TSH, Lipid Panel and HbA1c -C/w Nitroglycerin SL 0.4 mg q5hmin prn -Cardiology Consulted by EDP -C/w ASA 81 mg, Statin  -Last Cardiac Catheterization done in 2014 -Check BNP -Heart Healthy Diet  Alzheimer's Disease -C/w Galanatmine 4 mg po Daily  Bipolar Disorder -C/w Lamictal 100 mg po BID  -Invega 3 mg held  Depression and Anxiety -C/w Lorazepam 1 mg q12hprn  Hypertension -Not on any Medications as an outpatient -Will add Hydralazine for elevated SBP  Hyperlipidemia -Check Lipid Panel -C/w Atorvastatin 20 mg po Daily  GERD -C/w Protonix 40 mg po Daily  DVT prophylaxis: Heparin 5000 sq q8h Code Status: FULL CODE Family Communication:  Disposition Plan: Back to ALF  Consults called: Cardiology by EDP Admission status: Tele Obs  Merlene Laughter, D.O. [facility] Pager [phone]  If 7PM-7AM, please contact night-coverage [email or web address] Password [facility]  07/17/2016, 5:36 PM

## 2016-07-17 NOTE — ED Triage Notes (Signed)
Pt presents to the ed with ems from [facility] with complaints of pain in his chest on his left side for 2 weeks and some sob and nausea today. He received 4 baby aspirin and 2 nitro's, his pain has gone from a 7/10 to a 5/10

## 2016-07-18 ENCOUNTER — Encounter (HOSPITAL_COMMUNITY): Payer: Self-pay

## 2016-07-18 DIAGNOSIS — R0789 Other chest pain: Secondary | ICD-10-CM | POA: Diagnosis not present

## 2016-07-18 DIAGNOSIS — R079 Chest pain, unspecified: Secondary | ICD-10-CM | POA: Diagnosis not present

## 2016-07-18 DIAGNOSIS — F319 Bipolar disorder, unspecified: Secondary | ICD-10-CM | POA: Diagnosis not present

## 2016-07-18 DIAGNOSIS — E7800 Pure hypercholesterolemia, unspecified: Secondary | ICD-10-CM | POA: Diagnosis not present

## 2016-07-18 DIAGNOSIS — E785 Hyperlipidemia, unspecified: Secondary | ICD-10-CM | POA: Diagnosis not present

## 2016-07-18 DIAGNOSIS — K219 Gastro-esophageal reflux disease without esophagitis: Secondary | ICD-10-CM | POA: Diagnosis not present

## 2016-07-18 DIAGNOSIS — I10 Essential (primary) hypertension: Secondary | ICD-10-CM | POA: Diagnosis not present

## 2016-07-18 LAB — CBC WITH DIFFERENTIAL/PLATELET
BASOS PCT: 0 %
Basophils Absolute: 0 10*3/uL (ref 0.0–0.1)
Eosinophils Absolute: 0.2 10*3/uL (ref 0.0–0.7)
Eosinophils Relative: 2 %
HEMATOCRIT: 36.9 % — AB (ref 39.0–52.0)
HEMOGLOBIN: 12 g/dL — AB (ref 13.0–17.0)
LYMPHS ABS: 1.8 10*3/uL (ref 0.7–4.0)
LYMPHS PCT: 24 %
MCH: 32.1 pg (ref 26.0–34.0)
MCHC: 32.5 g/dL (ref 30.0–36.0)
MCV: 98.7 fL (ref 78.0–100.0)
MONO ABS: 0.5 10*3/uL (ref 0.1–1.0)
MONOS PCT: 7 %
NEUTROS ABS: 4.9 10*3/uL (ref 1.7–7.7)
NEUTROS PCT: 67 %
Platelets: 232 10*3/uL (ref 150–400)
RBC: 3.74 MIL/uL — ABNORMAL LOW (ref 4.22–5.81)
RDW: 13.1 % (ref 11.5–15.5)
WBC: 7.4 10*3/uL (ref 4.0–10.5)

## 2016-07-18 LAB — COMPREHENSIVE METABOLIC PANEL
ALBUMIN: 3.4 g/dL — AB (ref 3.5–5.0)
ALK PHOS: 86 U/L (ref 38–126)
ALT: 14 U/L — ABNORMAL LOW (ref 17–63)
ANION GAP: 9 (ref 5–15)
AST: 19 U/L (ref 15–41)
BILIRUBIN TOTAL: 0.6 mg/dL (ref 0.3–1.2)
BUN: 23 mg/dL — ABNORMAL HIGH (ref 6–20)
CALCIUM: 9.1 mg/dL (ref 8.9–10.3)
CO2: 24 mmol/L (ref 22–32)
Chloride: 104 mmol/L (ref 101–111)
Creatinine, Ser: 0.94 mg/dL (ref 0.61–1.24)
GLUCOSE: 127 mg/dL — AB (ref 65–99)
POTASSIUM: 4.4 mmol/L (ref 3.5–5.1)
Sodium: 137 mmol/L (ref 135–145)
TOTAL PROTEIN: 5.7 g/dL — AB (ref 6.5–8.1)

## 2016-07-18 LAB — LIPID PANEL
CHOLESTEROL: 158 mg/dL (ref 0–200)
HDL: 30 mg/dL — AB (ref >40)
LDL CALC: 100 mg/dL — AB (ref 0–99)
TRIGLYCERIDES: 139 mg/dL (ref <150)
Total CHOL/HDL Ratio: 5.3 RATIO
VLDL: 28 mg/dL (ref 0–40)

## 2016-07-18 LAB — MAGNESIUM: MAGNESIUM: 2.2 mg/dL (ref 1.7–2.4)

## 2016-07-18 LAB — MRSA PCR SCREENING: MRSA by PCR: NEGATIVE

## 2016-07-18 LAB — TROPONIN I

## 2016-07-18 LAB — PHOSPHORUS: Phosphorus: 3 mg/dL (ref 2.5–4.6)

## 2016-07-18 MED ORDER — NAPHAZOLINE-PHENIRAMINE 0.025-0.3 % OP SOLN
2.0000 [drp] | Freq: Three times a day (TID) | OPHTHALMIC | Status: DC | PRN
  Filled 2016-07-18: qty 5

## 2016-07-18 MED ORDER — MIRTAZAPINE 15 MG PO TABS: 15.0000 mg | ORAL_TABLET | Freq: Every day | ORAL | Status: DC

## 2016-07-18 MED ORDER — AMLODIPINE BESYLATE 5 MG PO TABS: 5.0000 mg | ORAL_TABLET | Freq: Every day | ORAL | 0 refills | Status: DC

## 2016-07-18 MED ORDER — AMLODIPINE BESYLATE 5 MG PO TABS
5.0000 mg | ORAL_TABLET | Freq: Every day | ORAL | Status: DC
  Administered 2016-07-18: 5 mg via ORAL
  Filled 2016-07-18: qty 1

## 2016-07-18 MED ORDER — LAMOTRIGINE 25 MG PO TABS
50.0000 mg | ORAL_TABLET | Freq: Two times a day (BID) | ORAL | Status: DC
  Administered 2016-07-18: 50 mg via ORAL
  Filled 2016-07-18: qty 2

## 2016-07-18 MED ORDER — ATORVASTATIN CALCIUM 20 MG PO TABS: 20.0000 mg | ORAL_TABLET | Freq: Every day | ORAL | 0 refills | Status: DC

## 2016-07-18 MED ORDER — METOPROLOL TARTRATE 25 MG PO TABS: 12.5000 mg | ORAL_TABLET | Freq: Two times a day (BID) | ORAL | 0 refills | Status: AC

## 2016-07-18 NOTE — Clinical Social Work Note (Signed)
CSW notified pt ready for DC. CSW contacted facility and called pt's family. CSW not able to reach pt's son Rocky LinkKen @ [phone]. Per RN [facility]   CSW will continue to follow.  Emberleigh Reily B. Gean QuintBrown,MSW, LCSWA Clinical Social Work Dept Weekend Social Worker [phone] 1:08 PM

## 2016-07-18 NOTE — Progress Notes (Signed)
07/18/2016 1740 Discharge AVS meds taken today and those due this evening reviewed.  Follow-up appointments and when to call md reviewed.  Questions and concerns addressed.   D/C back to ALF per orders.  Son here to take pt back to ALF. Kathryne HitchAllen, Jaeveon Ashland C

## 2016-07-18 NOTE — Clinical Social Work Note (Addendum)
Pt's dtr Clayborne DanaPatti returned CSW's call, will have Rocky LinkKen pick up pt ASAP.  [facility], ok for pt to come back today. CSW unsuccessful in reaching family; son Rocky LinkKen @ [phone] who was to transport pt back to the facility.  Message left with dtr on facesheet Sandy Salaamatti [name] [phone].  CSW will continue to follow.  Verdis Koval B. Gean QuintBrown,MSW, LCSWA Clinical Social Work Dept Weekend Social Worker [phone] 3:38 PM

## 2016-07-18 NOTE — Discharge Summary (Signed)
Physician Discharge Summary  Andre Marsh:[id number] DOB: 05-06-1927 DOA: 07/17/2016  PCP: Angela Cox, MD  Admit date: 07/17/2016 Discharge date: 07/18/2016  Admitted From: ALF Disposition:  ALF  Recommendations for Outpatient Follow-up:  1. Follow up with PCP in 1-2 weeks 2. Follow up with Cardiology Dr. Antoine Poche as an out patient on 07/24/16 at 10:30 for Stress Test 3. Have imaging done outpatient for AAA 4. Please obtain BMP/CBC in one week  Home Health: No Equipment/Devices: None  Discharge Condition: Stable CODE STATUS: FULL CODE Diet recommendation: Heart Healthy  Brief/Interim Summary: Andre Marsh is a 83 y.o. male with medical history significant of HTN, HLD, AAA, Alzheimers Disease, Parkinson's, Anxiety and Depression, GERD, Hx of CAD and self reported Hx of 2 MI's by patient (questionable as he stated he had them at the ALF and never came to the hospital) and other comorbids who presented to [facility] ED with a cc of Chest Pain. Patient has a history of Alzheimer's and is not the best historian but states that the pain started yesterday at 10 AM without any exertion. When asked to describe the pain the patient states it was "hard to describe" and then stated it felt sharp. States it was a constant pain but would wax and wane and never completely went away. Stated it was not there anymore but when he came in it was a 7/10. Denied any radiation but stated associated with Nausea and Shortness of breath. When asked about if he has similar symptoms in the past he stated that it felt similar but worse this instance. Patient denied any dizziness, diaphoresis, or vomiting. [facility] was called to evaluate and admit for his Chest Pain. He was placed in observation and ruled out with Negative Enzymes x3 and No change in EKG. Cardiology evaluated and recommended outpatient Lexiscan Myoview and outpatient imaging to follow AAA. At this time he was deemed medically stable to be D/C'd back to  ALF and follow up with PCP and Cardiology.   Discharge Diagnoses:  Active Problems:   Chest pain   Chest pain in adult  Chest Pain r/o'd ACS -Admitted to Tele Obs -CP was relieved with NTG -Cycle Troponin I x3 was <0.03 -Repeat EKG had no significant changes -Cardiology Consulted by EDP -C/w ASA 81 mg, Statin  And Beta Blocker -Last Cardiac Catheterization done in 2014 -Heart Healthy Diet -Outpatient Lexiscan Myoview Stress Test and follow up with Cardiology Dr. Antoine Poche at 10:30 on July 24, 2016.  Alzheimer's Disease -C/w Galanatmine 4 mg po Daily  Bipolar Disorder -C/w Lamictal 50 mg po BID  -May restart Invega at ALF  Depression and Anxiety -C/w Sertraline and Mirtazapine   Hypertension -Started Amlodipine 5 mg po Daily -C/w Metoprolol 12.5 mg po BID  Hyperlipidemia -Lipid Panel done and showed Cholesterol of 158, HDL of 30, LDL of 100, and TG of 139 -C/w Atorvastatin 20 mg po Daily  GERD -C/w Protonix 40 mg po Daily  BPH -C/w Tamsulosin  Discharge Instructions  Discharge Instructions    Call MD for:  difficulty breathing, headache or visual disturbances    Complete by:  As directed    Call MD for:  extreme fatigue    Complete by:  As directed    Call MD for:  persistant dizziness or light-headedness    Complete by:  As directed    Call MD for:  persistant nausea and vomiting    Complete by:  As directed    Call MD for:  severe uncontrolled pain    Complete by:  As directed    Call MD for:  temperature >100.4    Complete by:  As directed    Diet - low sodium heart healthy    Complete by:  As directed    Discharge instructions    Complete by:  As directed    Follow up with PCP and with Cardiology as an outpatient. Take all medications as prescribed. If symptoms change or worsen please return to the ED for evaluation.   Increase activity slowly    Complete by:  As directed      Allergies as of 07/18/2016      Reactions   Ciprofloxacin Hcl  Nausea And Vomiting   Cocoa Other (See Comments)   Unknown; listed in MAR   Codeine Other (See Comments)   Unknown; listed in MAR   Hydrocodone Other (See Comments)   Unknown; listed in MAR   Librax [chlordiazepoxide-clidinium] Other (See Comments)   Unknown; listed in MAR   Loperamide Hcl Other (See Comments)   Unknown; listed in [facility]   Metaxalone Other (See Comments)   Unknown; listed in MAR   Penicillins Other (See Comments)   Unknown; listed in MAR   Sulfa Antibiotics Nausea And Vomiting, Rash   Tetracyclines & Related Rash      Medication List    STOP taking these medications   LORazepam 0.5 MG tablet Commonly known as:  ATIVAN   phenazopyridine 100 MG tablet Commonly known as:  PYRIDIUM   traMADol 50 MG tablet Commonly known as:  ULTRAM     TAKE these medications   acetaminophen 325 MG tablet Commonly known as:  TYLENOL Take 650 mg by mouth 2 (two) times daily. FOR PAIN   amLODipine 5 MG tablet Commonly known as:  NORVASC Take 1 tablet (5 mg total) by mouth daily.   aspirin 81 MG tablet Take 81 mg by mouth daily.   atorvastatin 20 MG tablet Commonly known as:  LIPITOR Take 1 tablet (20 mg total) by mouth daily.   docusate sodium 100 MG capsule Commonly known as:  COLACE Take 100 mg by mouth at bedtime. For constipation   folic acid 1 MG tablet Commonly known as:  FOLVITE Take 1 mg by mouth daily.   galantamine 4 MG tablet Commonly known as:  RAZADYNE Take 4 mg by mouth daily. What changed:  Another medication with the same name was removed. Continue taking this medication, and follow the directions you see here.   isosorbide mononitrate 30 MG 24 hr tablet Commonly known as:  IMDUR Take 30 mg by mouth daily. For heart disease   lamoTRIgine 25 MG tablet Commonly known as:  LAMICTAL Take 50 mg by mouth 2 (two) times daily. What changed:  Another medication with the same name was removed. Continue taking this medication, and follow the directions  you see here.   loratadine 10 MG tablet Commonly known as:  CLARITIN Take 10 mg by mouth daily. For allergies   magnesium hydroxide 400 MG/5ML suspension Commonly known as:  MILK OF MAGNESIA Take 30 mLs by mouth every Monday, Wednesday, and Friday at 8 PM.   metoprolol tartrate 25 MG tablet Commonly known as:  LOPRESSOR Take 0.5 tablets (12.5 mg total) by mouth 2 (two) times daily.   mirtazapine 15 MG tablet Commonly known as:  REMERON Take 15 mg by mouth at bedtime.   naphazoline-pheniramine 0.025-0.3 % ophthalmic solution Commonly known as:  NAPHCON-A Place 2 drops into  both eyes every 8 (eight) hours as needed for irritation (or dry eyes).   nitroGLYCERIN 0.4 MG SL tablet Commonly known as:  NITROSTAT Place 0.4 mg under the tongue every 5 (five) minutes as needed for chest pain. CALL 9-1-1 AFTER THREE (3) DOSES   omeprazole 20 MG capsule Commonly known as:  PRILOSEC Take 20 mg by mouth 2 (two) times daily. For reflux   oseltamivir 75 MG capsule Commonly known as:  TAMIFLU Take 75 mg by mouth every other day. FOR 8 DAYS FOR PROPHYLAXIS   paliperidone 6 MG 24 hr tablet Commonly known as:  INVEGA Take 6 mg by mouth daily.   psyllium 28 % packet Commonly known as:  METAMUCIL SMOOTH TEXTURE Take 1 packet by mouth at bedtime.   senna 8.6 MG Tabs tablet Commonly known as:  SENOKOT Take 1 tablet by mouth daily.   sertraline 50 MG tablet Commonly known as:  ZOLOFT Take 75 mg by mouth daily.   SYSTANE BALANCE 0.6 % Soln Generic drug:  Propylene Glycol Apply 1 drop to eye every 12 (twelve) hours as needed (for dry eyes).   tamsulosin 0.4 MG Caps capsule Commonly known as:  FLOMAX Take 0.4 mg by mouth daily after supper. For enlarged prostate      Follow-up Information    Rollene Rotunda, MD Follow up on 07/24/2016.   Specialty:  Cardiology Why:  feb 8/at 10;30 stress test in room 15 Contact information: [address] [place]  [address] [phone]          Allergies  Allergen Reactions  . Ciprofloxacin Hcl Nausea And Vomiting  . Cocoa Other (See Comments)    Unknown; listed in MAR  . Codeine Other (See Comments)    Unknown; listed in MAR  . Hydrocodone Other (See Comments)    Unknown; listed in MAR  . Librax [Chlordiazepoxide-Clidinium] Other (See Comments)    Unknown; listed in MAR  . Loperamide Hcl Other (See Comments)    Unknown; listed in MAR  . Metaxalone Other (See Comments)    Unknown; listed in MAR  . Penicillins Other (See Comments)    Unknown; listed in MAR  . Sulfa Antibiotics Nausea And Vomiting and Rash  . Tetracyclines & Related Rash   Consultations:  Cardiology Dr. Antoine Poche  Procedures/Studies: Dg Chest 2 View  Result Date: 07/17/2016 CLINICAL DATA:  83 y/o M; left-sided chest pain, shortness of breath, nausea. EXAM: CHEST  2 VIEW COMPARISON:  08/30/2015 chest radiograph FINDINGS: Mild cardiomegaly stable given projection and technique. Aortic atherosclerosis with arch calcification. Stable reticular opacities in lung bases probably represents fibrosis and/or scarring. No focal consolidation. No pleural effusion. Stable moderate T12 anterior compression deformity. Cholecystectomy clips. IMPRESSION: Stable mild cardiomegaly and chronic lung changes. No acute pulmonary process identified Electronically Signed   By: Mitzi Hansen M.D.   On: 07/17/2016 15:56    Subjective: Seen and examined this am and denied Chest Pain to Cardiologist but admitted it to me. Stated it was 3/10 but was confused per daughter because of baseline Dementia. Has Cardiology follow up. No other complaints or concerns.   Discharge Exam: Vitals:   07/18/16 0609 07/18/16 1023  BP: (!) 161/74 (!) 159/67  Pulse: (!) 59 60  Resp: 18   Temp: 97.7 F (36.5 C)    Vitals:   07/17/16 1858 07/17/16 2115 07/18/16 0609 07/18/16 1023  BP: (!) 192/75 (!) 122/45 (!) 161/74 (!) 159/67  Pulse: 77 68 (!) 59 60   Resp:  18 18 18    Temp: 98 F (36.7 C) 98.3 F (36.8 C) 97.7 F (36.5 C)   TempSrc: Oral Oral Oral   SpO2: 95% 97% 98%   Weight:      Height:       General: Pt is alert, awake, not in acute distress; Pleasantly confused due to baseline dementia Cardiovascular: RRR, S1/S2 +, no rubs, no gallops Respiratory: CTA bilaterally, no wheezing, no rhonchi, not tachypenic or using accessory muscles to breathe Abdominal: Soft, NT, ND, bowel sounds + Extremities: no edema, no cyanosis  The results of significant diagnostics from this hospitalization (including imaging, microbiology, ancillary and laboratory) are listed below for reference.    Microbiology: Recent Results (from the past 240 hour(s))  MRSA PCR Screening     Status: None   Collection Time: 07/18/16  6:35 AM  Result Value Ref Range Status   MRSA by PCR NEGATIVE NEGATIVE Final    Comment:        The GeneXpert MRSA Assay (FDA approved for NASAL specimens only), is one component of a comprehensive MRSA colonization surveillance program. It is not intended to diagnose MRSA infection nor to guide or monitor treatment for MRSA infections.     Labs: BNP (last 3 results)  Recent Labs  07/17/16 1812  BNP 29.3   Basic Metabolic Panel:  Recent Labs Lab 07/17/16 1515 07/17/16 1910  NA 139  --   K 4.1  --   CL 109  --   CO2 23  --   GLUCOSE 101*  --   BUN 29*  --   CREATININE 1.01  --   CALCIUM 8.4*  --   MG  --  2.2  PHOS  --  3.4   Liver Function Tests: No results for input(s): AST, ALT, ALKPHOS, BILITOT, PROT, ALBUMIN in the last 168 hours. No results for input(s): LIPASE, AMYLASE in the last 168 hours. No results for input(s): AMMONIA in the last 168 hours. CBC:  Recent Labs Lab 07/17/16 1515  WBC 6.8  NEUTROABS 4.3  HGB 11.3*  HCT 34.7*  MCV 99.4  PLT 210   Cardiac Enzymes:  Recent Labs Lab 07/17/16 1910 07/17/16 2131 07/18/16 0019  TROPONINI <0.03 <0.03 <0.03   BNP: Invalid input(s):  POCBNP CBG: No results for input(s): GLUCAP in the last 168 hours. D-Dimer No results for input(s): DDIMER in the last 72 hours. Hgb A1c No results for input(s): HGBA1C in the last 72 hours. Lipid Profile  Recent Labs  07/18/16 0019  CHOL 158  HDL 30*  LDLCALC 100*  TRIG 139  CHOLHDL 5.3   Thyroid function studies  Recent Labs  07/17/16 1910  TSH 2.505   Anemia work up No results for input(s): VITAMINB12, FOLATE, FERRITIN, TIBC, IRON, RETICCTPCT in the last 72 hours. Urinalysis    Component Value Date/Time   COLORURINE YELLOW 08/30/2015 1235   APPEARANCEUR CLEAR 08/30/2015 1235   LABSPEC 1.017 08/30/2015 1235   PHURINE 6.0 08/30/2015 1235   GLUCOSEU NEGATIVE 08/30/2015 1235   HGBUR TRACE (A) 08/30/2015 1235   BILIRUBINUR NEGATIVE 08/30/2015 1235   KETONESUR NEGATIVE 08/30/2015 1235   PROTEINUR NEGATIVE 08/30/2015 1235   UROBILINOGEN 0.2 12/16/2010 1320   NITRITE NEGATIVE 08/30/2015 1235   LEUKOCYTESUR NEGATIVE 08/30/2015 1235   Sepsis Labs Invalid input(s): PROCALCITONIN,  WBC,  LACTICIDVEN Microbiology Recent Results (from the past 240 hour(s))  MRSA PCR Screening     Status: None   Collection Time: 07/18/16  6:35 AM  Result Value  Ref Range Status   MRSA by PCR NEGATIVE NEGATIVE Final    Comment:        The GeneXpert MRSA Assay (FDA approved for NASAL specimens only), is one component of a comprehensive MRSA colonization surveillance program. It is not intended to diagnose MRSA infection nor to guide or monitor treatment for MRSA infections.    Time coordinating discharge: Over 30 minutes  SIGNED:  Merlene Laughtermair Latif Sheikh, DO [facility] 07/18/2016, 12:32 PM Pager [phone]  If 7PM-7AM, please contact night-coverage [email or web address] Password [email or web address]

## 2016-07-18 NOTE — Progress Notes (Signed)
Progress Note  Patient Name: Andre Marsh Date of Encounter: 07/18/2016  Primary Cardiologist:   New  Subjective   He does not report chest pain although he changes his mind and is vague about symptoms.   Inpatient Medications    Scheduled Meds: . amLODipine  5 mg Oral Daily  . aspirin EC  81 mg Oral Daily  . atorvastatin  20 mg Oral Daily  . folic acid  1 mg Oral Daily  . heparin  5,000 Units Subcutaneous Q8H  . isosorbide mononitrate  30 mg Oral Daily  . lamoTRIgine  50 mg Oral BID  . loratadine  10 mg Oral Daily  . metoprolol tartrate  25 mg Oral BID  . mirtazapine  15 mg Oral QHS  . pantoprazole  40 mg Oral Daily  . polyvinyl alcohol  1 drop Both Eyes BID  . psyllium  1 packet Oral QHS  . tamsulosin  0.4 mg Oral QPC supper   Continuous Infusions:  PRN Meds: acetaminophen, naphazoline-pheniramine, nitroGLYCERIN, ondansetron (ZOFRAN) IV, senna   Vital Signs    Vitals:   07/17/16 1830 07/17/16 1858 07/17/16 2115 07/18/16 0609  BP: 105/94 (!) 192/75 (!) 122/45 (!) 161/74  Pulse: 67 77 68 (!) 59  Resp:  18 18 18   Temp:  98 F (36.7 C) 98.3 F (36.8 C) 97.7 F (36.5 C)  TempSrc:  Oral Oral Oral  SpO2: 90% 95% 97% 98%  Weight:      Height:        Intake/Output Summary (Last 24 hours) at 07/18/16 0858 Last data filed at 07/18/16 0804  Gross per 24 hour  Intake              240 ml  Output              600 ml  Net             -360 ml   Filed Weights   07/17/16 1452  Weight: 120 lb (54.4 kg)    Telemetry    NSR - Personally Reviewed  ECG    NSR, rate 58, RBBB, no acute ST T wave changes.  - Personally Reviewed  Physical Exam   GEN: No acute distress.   Neck: No JVD Cardiac: RRR, no murmurs, rubs, or gallops.  Respiratory: Clear to auscultation bilaterally. GI: Soft, nontender, non-distended  MS: No edema; No deformity. Neuro:  Nonfocal  Psych: Normal affect  Labs    Chemistry Recent Labs Lab 07/17/16 1515  NA 139  K 4.1  CL 109    CO2 23  GLUCOSE 101*  BUN 29*  CREATININE 1.01  CALCIUM 8.4*  GFRNONAA >60  GFRAA >60  ANIONGAP 7     Hematology Recent Labs Lab 07/17/16 1515  WBC 6.8  RBC 3.49*  HGB 11.3*  HCT 34.7*  MCV 99.4  MCH 32.4  MCHC 32.6  RDW 13.2  PLT 210    Cardiac Enzymes Recent Labs Lab 07/17/16 1910 07/17/16 2131 07/18/16 0019  TROPONINI <0.03 <0.03 <0.03    Recent Labs Lab 07/17/16 1521  TROPIPOC 0.00     BNP Recent Labs Lab 07/17/16 1812  BNP 29.3     DDimer No results for input(s): DDIMER in the last 168 hours.   Radiology    Dg Chest 2 View  Result Date: 07/17/2016 CLINICAL DATA:  83 y/o M; left-sided chest pain, shortness of breath, nausea. EXAM: CHEST  2 VIEW COMPARISON:  08/30/2015 chest radiograph FINDINGS: Mild cardiomegaly  stable given projection and technique. Aortic atherosclerosis with arch calcification. Stable reticular opacities in lung bases probably represents fibrosis and/or scarring. No focal consolidation. No pleural effusion. Stable moderate T12 anterior compression deformity. Cholecystectomy clips. IMPRESSION: Stable mild cardiomegaly and chronic lung changes. No acute pulmonary process identified Electronically Signed   By: Mitzi Hansen M.D.   On: 07/17/2016 15:56    Cardiac Studies   NA  Patient Profile     83 y.o. male presented with chest pain.  Vague history of previous cardiac cath. History of syncope in 2015 with negative neuro and cardia work up  Assessment & Plan    CHEST PAIN:  Enzymes negative.   EKG no changes.  We will arrange follow up out patient Lexiscan Myoview.  AAA:  Needs follow up in our office to image.   Signed, Rollene Rotunda, MD  07/18/2016, 8:58 AM

## 2016-07-19 LAB — HEMOGLOBIN A1C
Hgb A1c MFr Bld: 5.6 % (ref 4.8–5.6)
Mean Plasma Glucose: 114 mg/dL

## 2016-07-24 ENCOUNTER — Ambulatory Visit: Payer: Medicare Other | Admitting: Cardiology

## 2016-07-25 ENCOUNTER — Other Ambulatory Visit: Payer: Self-pay | Admitting: Physician Assistant

## 2016-07-25 DIAGNOSIS — I714 Abdominal aortic aneurysm, without rupture, unspecified: Secondary | ICD-10-CM

## 2016-07-25 DIAGNOSIS — R079 Chest pain, unspecified: Secondary | ICD-10-CM

## 2016-07-28 ENCOUNTER — Telehealth (HOSPITAL_COMMUNITY): Payer: Self-pay | Admitting: Interventional Cardiology

## 2016-07-29 NOTE — Telephone Encounter (Signed)
/  05/2017 10:16 AM Phone (Outgoing) Andre Marsh, Andre Marsh (Self) [phone] (H)   Left Message - Called pt and lmsg for him /or caregiver to CB ..in regards to scheduling an myoview per Dr. Pete GlatterStoneking.    By Elita Booneegina A Jonae Renshaw    07/29/2016 02:09 PM Phone (Outgoing) [name],[name] [facility](EC) [phone] (M)   Left Message - Returned [name]'s(dtr) and lmsg for her to CB..to schedule myoview and AAA duplex        Pt's dtr returned my call in regards to getting patient scheduled for the test and she voiced that she has been having a tough time trying to convince the patient to come in for any outpaitent procedure. She says that he says his legs have been hurting him too bad to come in for test. So the patient will be removed from the workqueue .

## 2021-07-03 ENCOUNTER — Other Ambulatory Visit: Payer: Self-pay

## 2021-07-03 ENCOUNTER — Ambulatory Visit (INDEPENDENT_AMBULATORY_CARE_PROVIDER_SITE_OTHER): Payer: Medicare Other

## 2021-07-03 ENCOUNTER — Emergency Department (HOSPITAL_COMMUNITY): Payer: Medicare Other

## 2021-07-03 ENCOUNTER — Ambulatory Visit (HOSPITAL_COMMUNITY)
Admission: EM | Admit: 2021-07-03 | Discharge: 2021-07-03 | Disposition: A | Discharge status: Home / Self Care | Payer: Medicare Other | Attending: Internal Medicine | Admitting: Internal Medicine

## 2021-07-03 ENCOUNTER — Encounter (HOSPITAL_COMMUNITY): Payer: Self-pay | Admitting: *Deleted

## 2021-07-03 ENCOUNTER — Emergency Department (HOSPITAL_COMMUNITY)
Admission: EM | Admit: 2021-07-03 | Discharge: 2021-07-04 | Disposition: A | Discharge status: Skilled Nursing Facility | Payer: Medicare Other | Attending: Emergency Medicine | Admitting: Emergency Medicine

## 2021-07-03 ENCOUNTER — Encounter (HOSPITAL_COMMUNITY): Payer: Self-pay

## 2021-07-03 DIAGNOSIS — M25551 Pain in right hip: Secondary | ICD-10-CM | POA: Diagnosis not present

## 2021-07-03 DIAGNOSIS — X58XXXA Exposure to other specified factors, initial encounter: Secondary | ICD-10-CM | POA: Diagnosis not present

## 2021-07-03 DIAGNOSIS — S32040A Wedge compression fracture of fourth lumbar vertebra, initial encounter for closed fracture: Secondary | ICD-10-CM

## 2021-07-03 DIAGNOSIS — R52 Pain, unspecified: Secondary | ICD-10-CM | POA: Diagnosis not present

## 2021-07-03 DIAGNOSIS — M549 Dorsalgia, unspecified: Secondary | ICD-10-CM | POA: Diagnosis present

## 2021-07-03 DIAGNOSIS — R109 Unspecified abdominal pain: Secondary | ICD-10-CM | POA: Insufficient documentation

## 2021-07-03 DIAGNOSIS — Z79899 Other long term (current) drug therapy: Secondary | ICD-10-CM | POA: Diagnosis not present

## 2021-07-03 DIAGNOSIS — F0390 Unspecified dementia without behavioral disturbance: Secondary | ICD-10-CM | POA: Insufficient documentation

## 2021-07-03 DIAGNOSIS — I714 Abdominal aortic aneurysm, without rupture, unspecified: Secondary | ICD-10-CM | POA: Diagnosis not present

## 2021-07-03 DIAGNOSIS — Z7982 Long term (current) use of aspirin: Secondary | ICD-10-CM | POA: Insufficient documentation

## 2021-07-03 DIAGNOSIS — S32049A Unspecified fracture of fourth lumbar vertebra, initial encounter for closed fracture: Secondary | ICD-10-CM | POA: Insufficient documentation

## 2021-07-03 LAB — COMPREHENSIVE METABOLIC PANEL
ALT: 11 U/L (ref 0–44)
AST: 17 U/L (ref 15–41)
Albumin: 3.5 g/dL (ref 3.5–5.0)
Alkaline Phosphatase: 72 U/L (ref 38–126)
Anion gap: 9 (ref 5–15)
BUN: 27 mg/dL — ABNORMAL HIGH (ref 8–23)
CO2: 20 mmol/L — ABNORMAL LOW (ref 22–32)
Calcium: 9.2 mg/dL (ref 8.9–10.3)
Chloride: 111 mmol/L (ref 98–111)
Creatinine, Ser: 1.17 mg/dL (ref 0.61–1.24)
GFR, Estimated: 58 mL/min — ABNORMAL LOW (ref >60)
Glucose, Bld: 173 mg/dL — ABNORMAL HIGH (ref 70–99)
Potassium: 3.7 mmol/L (ref 3.5–5.1)
Sodium: 140 mmol/L (ref 135–145)
Total Bilirubin: 0.5 mg/dL (ref 0.3–1.2)
Total Protein: 6.4 g/dL — ABNORMAL LOW (ref 6.5–8.1)

## 2021-07-03 LAB — CBC WITH DIFFERENTIAL/PLATELET
Abs Immature Granulocytes: 0.03 10*3/uL (ref 0.00–0.07)
Basophils Absolute: 0 10*3/uL (ref 0.0–0.1)
Basophils Relative: 1 %
Eosinophils Absolute: 0.2 10*3/uL (ref 0.0–0.5)
Eosinophils Relative: 2 %
HCT: 37.3 % — ABNORMAL LOW (ref 39.0–52.0)
Hemoglobin: 12.1 g/dL — ABNORMAL LOW (ref 13.0–17.0)
Immature Granulocytes: 0 %
Lymphocytes Relative: 21 %
Lymphs Abs: 1.6 10*3/uL (ref 0.7–4.0)
MCH: 34 pg (ref 26.0–34.0)
MCHC: 32.4 g/dL (ref 30.0–36.0)
MCV: 104.8 fL — ABNORMAL HIGH (ref 80.0–100.0)
Monocytes Absolute: 0.7 10*3/uL (ref 0.1–1.0)
Monocytes Relative: 9 %
Neutro Abs: 5.5 10*3/uL (ref 1.7–7.7)
Neutrophils Relative %: 67 %
Platelets: 168 10*3/uL (ref 150–400)
RBC: 3.56 MIL/uL — ABNORMAL LOW (ref 4.22–5.81)
RDW: 12.9 % (ref 11.5–15.5)
WBC: 8 10*3/uL (ref 4.0–10.5)
nRBC: 0 % (ref 0.0–0.2)

## 2021-07-03 LAB — URINALYSIS, ROUTINE W REFLEX MICROSCOPIC
Bilirubin Urine: NEGATIVE
Glucose, UA: NEGATIVE mg/dL
Hgb urine dipstick: NEGATIVE
Ketones, ur: NEGATIVE mg/dL
Leukocytes,Ua: NEGATIVE
Nitrite: NEGATIVE
Protein, ur: NEGATIVE mg/dL
Specific Gravity, Urine: >1.03 — ABNORMAL HIGH (ref 1.005–1.030)
pH: 5.5 (ref 5.0–8.0)

## 2021-07-03 LAB — LIPASE, BLOOD: Lipase: 27 U/L (ref 11–51)

## 2021-07-03 LAB — POCT URINALYSIS DIPSTICK, ED / UC
Bilirubin Urine: NEGATIVE
Glucose, UA: NEGATIVE mg/dL
Hgb urine dipstick: NEGATIVE
Leukocytes,Ua: NEGATIVE
Nitrite: NEGATIVE
Protein, ur: NEGATIVE mg/dL
Specific Gravity, Urine: 1.02 (ref 1.005–1.030)
Urobilinogen, UA: 0.2 mg/dL (ref 0.0–1.0)
pH: 5 (ref 5.0–8.0)

## 2021-07-03 MED ORDER — SODIUM CHLORIDE 0.9 % IV BOLUS: 500.0000 mL | Freq: Once | INTRAVENOUS | Status: DC

## 2021-07-03 MED ORDER — HYDROMORPHONE HCL 2 MG PO TABS: 2.0000 mg | ORAL_TABLET | Freq: Four times a day (QID) | ORAL | 0 refills | Status: AC | PRN

## 2021-07-03 MED ORDER — FENTANYL CITRATE PF 50 MCG/ML IJ SOSY
25.0000 ug | PREFILLED_SYRINGE | Freq: Once | INTRAMUSCULAR | Status: AC
  Administered 2021-07-03: 25 ug via INTRAVENOUS
  Filled 2021-07-03: qty 1

## 2021-07-03 MED ORDER — IOHEXOL 300 MG/ML  SOLN
100.0000 mL | Freq: Once | INTRAMUSCULAR | Status: AC | PRN
  Administered 2021-07-03: 100 mL via INTRAVENOUS

## 2021-07-03 NOTE — ED Triage Notes (Signed)
The pt is hard of hearing  he reports that he has had a uti for one month pain in his rt flank

## 2021-07-03 NOTE — ED Notes (Signed)
Patient is being discharged from the Urgent Care and sent to the Emergency Department via POV . Per NP, patient is in need of higher level of care due to need of further evaluation. Patient is aware and verbalizes understanding of plan of care.  Vitals:   07/03/21 1615 07/03/21 1617  BP: (!) 159/66   Pulse: 61   Resp: 15   Temp:  97.9 F (36.6 C)  SpO2: 95%

## 2021-07-03 NOTE — ED Notes (Addendum)
Ortho tech called. Outside vendor paged

## 2021-07-03 NOTE — ED Provider Notes (Signed)
[facility] Provider Note   CSN: [id number] Arrival date & time: 07/03/21  1907     History  No chief complaint on file.   Andre Marsh is a 88 y.o. male.  HPI 88 year old male presents with hip/back pain.  History is limited due to the patient's history of dementia.  He is also quite hard of hearing.  Family including son is at the bedside.  Patient has a chronic history of some back pain but it seems to be worse today and he called the patient's daughter due to this pain.  Has taken Tylenol without much relief.  Patient denies any weakness or trauma/fall.  No urinary symptoms.  Otherwise history is limited.  Patient went to urgent care and had right hip xrays performed and was then sent here.  Home Medications Prior to Admission medications   Medication Sig Start Date End Date Taking? Authorizing Provider  HYDROmorphone (DILAUDID) 2 MG tablet Take 1 tablet (2 mg total) by mouth every 6 (six) hours as needed for severe pain. 07/03/21  Yes Pricilla LovelessGoldston, Bowyn Mercier, MD  acetaminophen (TYLENOL) 325 MG tablet Take 650 mg by mouth 2 (two) times daily. FOR PAIN    [provider]  amLODipine (NORVASC) 5 MG tablet Take 1 tablet (5 mg total) by mouth daily. 07/18/16   Marguerita MerlesSheikh, Omair Latif, DO  aspirin 81 MG tablet Take 81 mg by mouth daily.    [provider]  atorvastatin (LIPITOR) 20 MG tablet Take 1 tablet (20 mg total) by mouth daily. 07/18/16   Marguerita MerlesSheikh, Omair Latif, DO  docusate sodium (COLACE) 100 MG capsule Take 100 mg by mouth at bedtime. For constipation    [provider]  folic acid (FOLVITE) 1 MG tablet Take 1 mg by mouth daily.    [provider]  galantamine (RAZADYNE) 4 MG tablet Take 4 mg by mouth daily.    [provider]  isosorbide mononitrate (IMDUR) 30 MG 24 hr tablet Take 30 mg by mouth daily. For heart disease    [provider]  lamoTRIgine (LAMICTAL) 25 MG tablet Take 50 mg by mouth 2 (two)  times daily.    [provider]  loratadine (CLARITIN) 10 MG tablet Take 10 mg by mouth daily. For allergies    [provider]  magnesium hydroxide (MILK OF MAGNESIA) 400 MG/5ML suspension Take 30 mLs by mouth every Monday, Wednesday, and Friday at 8 PM.    [provider]  metoprolol tartrate (LOPRESSOR) 25 MG tablet Take 0.5 tablets (12.5 mg total) by mouth 2 (two) times daily. 07/18/16   Marguerita MerlesSheikh, Omair Latif, DO  mirtazapine (REMERON) 15 MG tablet Take 15 mg by mouth at bedtime.    [provider]  naphazoline-pheniramine (NAPHCON-A) 0.025-0.3 % ophthalmic solution Place 2 drops into both eyes every 8 (eight) hours as needed for irritation (or dry eyes).    [provider]  nitroGLYCERIN (NITROSTAT) 0.4 MG SL tablet Place 0.4 mg under the tongue every 5 (five) minutes as needed for chest pain. CALL 9-1-1 AFTER THREE (3) DOSES    [provider]  omeprazole (PRILOSEC) 20 MG capsule Take 20 mg by mouth 2 (two) times daily. For reflux    [provider]  oseltamivir (TAMIFLU) 75 MG capsule Take 75 mg by mouth every other day. FOR 8 DAYS FOR PROPHYLAXIS    [provider]  paliperidone (INVEGA) 6 MG 24 hr tablet Take 6 mg by mouth daily.  [provider]  Propylene Glycol (SYSTANE BALANCE) 0.6 % SOLN Apply 1 drop to eye every 12 (twelve) hours as needed (for dry eyes).     [provider]  psyllium (METAMUCIL SMOOTH TEXTURE) 28 % packet Take 1 packet by mouth at bedtime.    [provider]  senna (SENOKOT) 8.6 MG TABS tablet Take 1 tablet by mouth daily.     [provider]  sertraline (ZOLOFT) 50 MG tablet Take 75 mg by mouth daily.    [provider]  tamsulosin (FLOMAX) 0.4 MG CAPS capsule Take 0.4 mg by mouth daily after supper. For enlarged prostate    [provider]      Allergies    Ciprofloxacin hcl, Cocoa, Codeine, Hydrocodone, Librax [chlordiazepoxide-clidinium],  Loperamide hcl, Metaxalone, Penicillins, Sulfa antibiotics, and Tetracyclines & related    Review of Systems   Review of Systems  Gastrointestinal:  Negative for abdominal pain.  Genitourinary:  Negative for dysuria.  Musculoskeletal:  Positive for arthralgias and back pain.  Neurological:  Negative for weakness.   Physical Exam Updated Vital Signs BP 102/61 (BP Location: Left Arm)    Pulse 60    Temp 98.9 F (37.2 C)    Resp 20    Ht 5\' 2"  (1.575 m)    Wt 54.4 kg    SpO2 98%    BMI 21.94 kg/m  Physical Exam Vitals and nursing note reviewed.  Constitutional:      Appearance: He is well-developed.  HENT:     Head: Normocephalic and atraumatic.  Cardiovascular:     Rate and Rhythm: Normal rate and regular rhythm.     Pulses:          Posterior tibial pulses are 2+ on the right side.     Heart sounds: Normal heart sounds.  Pulmonary:     Effort: Pulmonary effort is normal.     Breath sounds: Normal breath sounds.  Abdominal:     Palpations: Abdomen is soft.     Tenderness: There is no abdominal tenderness. There is no right CVA tenderness or left CVA tenderness.  Musculoskeletal:     Thoracic back: No tenderness.     Lumbar back: Tenderness (mild) present.     Right hip: No tenderness. Normal range of motion.     Left hip: No tenderness. Normal range of motion.  Skin:    General: Skin is warm and dry.  Neurological:     Mental Status: He is alert.    ED Results / Procedures / Treatments   Labs (all labs ordered are listed, but only abnormal results are displayed) Labs Reviewed  COMPREHENSIVE METABOLIC PANEL - Abnormal; Notable for the following components:      Result Value   CO2 20 (*)    Glucose, Bld 173 (*)    BUN 27 (*)    Total Protein 6.4 (*)    GFR, Estimated 58 (*)    All other components within normal limits  CBC WITH DIFFERENTIAL/PLATELET - Abnormal; Notable for the following components:   RBC 3.56 (*)    Hemoglobin 12.1 (*)    HCT 37.3 (*)    MCV  104.8 (*)    All other components within normal limits  URINALYSIS, ROUTINE W REFLEX MICROSCOPIC - Abnormal; Notable for the following components:   Specific Gravity, Urine >1.030 (*)    All other components within normal limits  LIPASE, BLOOD    EKG None  Radiology CT ABDOMEN PELVIS W CONTRAST  Result Date: 07/03/2021 CLINICAL DATA:  Right-sided abdominal pain, back pain EXAM: CT ABDOMEN AND PELVIS WITH CONTRAST TECHNIQUE: Multidetector CT imaging of the abdomen and pelvis was performed using the standard protocol following bolus administration of intravenous contrast. RADIATION DOSE REDUCTION: This exam was performed according to the departmental dose-optimization program which includes automated exposure control, adjustment of the mA and/or kV according to patient size and/or use of iterative reconstruction technique. CONTRAST:  OMNIPAQUE IOHEXOL 300 MG/ML  SOLN COMPARISON:  None. FINDINGS: Lower chest: Bibasilar bronchiectasis and fibrosis. Scattered ground-glass densities at the right lung base are likely hypoventilatory. No effusion or pneumothorax. Moderate hiatal hernia. Hepatobiliary: Mild diffuse hepatic steatosis. The gallbladder is surgically absent. No biliary duct dilation. Pancreas: Unremarkable. No pancreatic ductal dilatation or surrounding inflammatory changes. Spleen: Normal in size without focal abnormality. Adrenals/Urinary Tract: Bilateral renal cortical thinning. Numerous bilateral renal cysts. No urinary tract calculi or obstructive uropathy. Bladder is moderately distended, with mild anterior wall thickening measuring up to 4 mm. No filling defects. The adrenals are unremarkable. Stomach/Bowel: No bowel obstruction or ileus. Diffuse colonic diverticulosis most pronounced within the sigmoid colon. No evidence of diverticulitis. Normal appendix right lower quadrant. Vascular/Lymphatic: Infrarenal abdominal aortic aneurysm measuring up to 4.3 cm. Significant mural thrombus.  Diffuse atherosclerosis throughout the aorta and its branches. No pathologic adenopathy. Reproductive: Prostate is unremarkable. Other: No free fluid or free gas.  No abdominal wall hernia. Musculoskeletal: There are multiple age-indeterminate thoracolumbar compression deformities. Compression fractures at T11, T12, L1, and L3 appear chronic. Compression fracture at L4 may be acute or subacute. No retropulsion. Reconstructed images demonstrate no additional findings. Reconstructed images demonstrate no additional findings. IMPRESSION: 1. Suspected acute to subacute L4 compression fracture. Numerous other chronic appearing thoracolumbar compression deformities. 2. 4.3 cm infrarenal abdominal aortic aneurysm. Recommend follow-up every 12 months and vascular consultation. Reference: J Am Coll Radiol 2013;10:789-794. 3. Moderate hiatal hernia. 4. Hepatic steatosis. 5. Diffuse colonic diverticulosis without diverticulitis. 6. Bibasilar bronchiectasis and scarring. 7.  Aortic Atherosclerosis (ICD10-I70.0). Electronically Signed   By: Sharlet Salina M.D.   On: 07/03/2021 22:44   DG Hip Unilat With Pelvis 2-3 Views Right  Result Date: 07/03/2021 CLINICAL DATA:  Right-sided pain. EXAM: DG HIP (WITH OR WITHOUT PELVIS) 2-3V RIGHT COMPARISON:  None. FINDINGS: The bones are diffusely osteopenic. There is no definite acute fracture or dislocation identified. Joint spaces are maintained. There are vascular calcifications in the soft tissues. IMPRESSION: 1. Limited by osteopenia. 2. No definite acute fracture or dislocation. 3. If there is high clinical concern for occult fracture, consider further evaluation with CT. Electronically Signed   By: Darliss Cheney M.D.   On: 07/03/2021 17:10    Procedures Procedures    Medications Ordered in ED Medications  sodium chloride 0.9 % bolus 500 mL (has no administration in time range)  fentaNYL (SUBLIMAZE) injection 25 mcg (25 mcg Intravenous Given 07/03/21 2059)  iohexol  (OMNIPAQUE) 300 MG/ML solution 100 mL (100 mLs Intravenous Contrast Given 07/03/21 2221)    ED Course/ Medical Decision Making/ A&P                           Medical Decision Making  Patient appears to have a chief complaint of acute on chronic back pain. He was in enough pain that according to his daughter (who I spoke to) he was crawling today. No obvious injuries. He is freely moving his lower extremities without apparent weakness or hip pain.  Chart reviewed by me shows that he was just at urgent care and they were concerned about a possible occult hip fracture which is why they sent him here for CT.  CT was ultimately obtained and I did his whole abdomen and pelvis given the nonspecific presentation.  Chart review also shows that he had a AAA and was due for ultrasounds though does not appear family knows about that from a couple years ago.  CT today was reviewed and interpreted and there is indeed a AAA though it does not appear to have ruptured.  He also has an L4 compression fracture.  I suspect the L4 compression fracture is what is causing his symptoms today.  He is feeling much better with the fentanyl.  He sometimes walks with a walker but often is in a wheelchair.  I reviewed and interpreted his labs which show a slightly elevated BUN but otherwise are benign.  Given his age I did consider admission but his pain seems to be so well controlled I think he can go back to his skilled nursing facility.  He does not need an emergency consult from vascular but I will refer to vascular as an outpatient for the AAA, though they will obviously need to have discussions of whether it is worth a repair/surgery.  Family was updated at the bedside.  Patient will be given a short course of hydromorphone for pain and continue Tylenol.  He will be given an LSO for comfort.  Stable for discharge.        Final Clinical Impression(s) / ED Diagnoses Final diagnoses:  Closed compression fracture of L4  vertebra, initial encounter (HCC)  Abdominal aortic aneurysm (AAA) without rupture, unspecified part    Rx / DC Orders ED Discharge Orders          Ordered    HYDROmorphone (DILAUDID) 2 MG tablet  Every 6 hours PRN        07/03/21 2302              Pricilla LovelessGoldston, Dayanara Sherrill, MD 07/03/21 2314

## 2021-07-03 NOTE — ED Triage Notes (Signed)
Pt presents with c/o R side pain x a long time per pt.   States he was given Tylenol to mange pain.   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

## 2021-07-03 NOTE — Discharge Instructions (Signed)
Unfortunately, I do not see any cause for Andre Marsh hip pain based on his xray and urinalysis. The radiologist recommended possibly a CT to rule out any fracture. You would need to go to the ER to have him evaluated.

## 2021-07-03 NOTE — Discharge Instructions (Addendum)
If you develop worsening, recurrent, or continued back pain, numbness or weakness in the legs, incontinence of your bowels or bladders, numbness of your buttocks, fever, abdominal pain, or any other new/concerning symptoms then return to the ER for evaluation.  

## 2021-07-03 NOTE — ED Provider Notes (Signed)
[facility]    CSN: [id number] Arrival date & time: 07/03/21  1514      History   Chief Complaint Chief Complaint  Patient presents with   Flank Pain    HPI Andre Marsh is a 88 y.o. male of [facility] presents urgent care today with his daughter.  Patient with PMH of Alzheimer's, CAD, bipolar disorder, hypertension presents urgent care today with complaints of right lower back pain.  Daughter reports several year history of low back pain for which she takes Tylenol however pain more significant today.  Daughter states patient wanted to go to the ER earlier due to pain but wanted to avoid long wait.  Pain persistent and worse with any movement.  Typically ambulates with a walker but difficulty bearing any weight currently.  Had to crawl to phone earlier when she called. Patient denies recent fall however poor historian.  He denies any dizziness, chest pain, shortness of breath, abdominal pain, N/V/D.  Last BM 2 days ago WNL.  No new medications.   Past Medical History:  Diagnosis Date   AAA (abdominal aortic aneurysm)    Alzheimer disease (HCC)    Anxiety    Bipolar 1 disorder (HCC)    CAD (coronary artery disease)    a. patient reported nonobstructive CAD. b. patient reported cath 2014 @ [facility]   Cataract    bilateral   Depression    Fall    GERD (gastroesophageal reflux disease)    Hyperlipidemia    Hypertension    Paralysis [facility]itans [facility])     Patient Active Problem List   Diagnosis Date Noted   Chest pain in adult 07/17/2016   D-dimer, elevated 07/29/2015   BPH (benign prostatic hypertrophy) 07/29/2015   Headache 07/23/2015   Psychosis (HCC) 06/13/2015   Cerumen debris on tympanic membrane 04/25/2015   Cough 04/08/2015   Rib pain 03/01/2015   SOB (shortness of breath) 01/06/2015   Schizoaffective disorder (HCC) 11/17/2014   Back pain 06/29/2014   Chest pain 02/08/2014   Bradycardia 02/03/2014   Near syncope 02/01/2014   Hypotension 02/01/2014    Dementia with behavioral disturbance 01/24/2014   AAA (abdominal aortic aneurysm) without rupture 10/19/2013   Pneumonia 09/28/2013   Bipolar disorder (HCC) 02/04/2013   GERD (gastroesophageal reflux disease) 02/04/2013   Hyperglycemia 12/13/2012   Unspecified constipation 10/22/2012   Pure hypercholesterolemia 10/22/2012   Essential hypertension, benign 10/22/2012   Allergic rhinitis 10/22/2012    Past Surgical History:  Procedure Laterality Date   CATARACT EXTRACTION Left        Home Medications    Prior to Admission medications   Medication Sig Start Date End Date Taking? Authorizing Provider  acetaminophen (TYLENOL) 325 MG tablet Take 650 mg by mouth 2 (two) times daily. FOR PAIN    [provider]  amLODipine (NORVASC) 5 MG tablet Take 1 tablet (5 mg total) by mouth daily. 07/18/16   Marguerita Merles Latif, DO  aspirin 81 MG tablet Take 81 mg by mouth daily.    [provider]  atorvastatin (LIPITOR) 20 MG tablet Take 1 tablet (20 mg total) by mouth daily. 07/18/16   Marguerita Merles Latif, DO  docusate sodium (COLACE) 100 MG capsule Take 100 mg by mouth at bedtime. For constipation    [provider]  folic acid (FOLVITE) 1 MG tablet Take 1 mg by mouth daily.    [provider]  galantamine (RAZADYNE) 4 MG tablet Take 4 mg by mouth daily.  [provider]  isosorbide mononitrate (IMDUR) 30 MG 24 hr tablet Take 30 mg by mouth daily. For heart disease    [provider]  lamoTRIgine (LAMICTAL) 25 MG tablet Take 50 mg by mouth 2 (two) times daily.    [provider]  loratadine (CLARITIN) 10 MG tablet Take 10 mg by mouth daily. For allergies    [provider]  magnesium hydroxide (MILK OF MAGNESIA) 400 MG/5ML suspension Take 30 mLs by mouth every Monday, Wednesday, and Friday at 8 PM.    [provider]  metoprolol tartrate (LOPRESSOR) 25 MG tablet Take 0.5 tablets (12.5 mg total) by mouth 2 (two) times  daily. 07/18/16   Raiford Noble Latif, DO  mirtazapine (REMERON) 15 MG tablet Take 15 mg by mouth at bedtime.    [provider]  naphazoline-pheniramine (NAPHCON-A) 0.025-0.3 % ophthalmic solution Place 2 drops into both eyes every 8 (eight) hours as needed for irritation (or dry eyes).    [provider]  nitroGLYCERIN (NITROSTAT) 0.4 MG SL tablet Place 0.4 mg under the tongue every 5 (five) minutes as needed for chest pain. CALL 9-1-1 AFTER THREE (3) DOSES    [provider]  omeprazole (PRILOSEC) 20 MG capsule Take 20 mg by mouth 2 (two) times daily. For reflux    [provider]  oseltamivir (TAMIFLU) 75 MG capsule Take 75 mg by mouth every other day. FOR 8 DAYS FOR PROPHYLAXIS    [provider]  paliperidone (INVEGA) 6 MG 24 hr tablet Take 6 mg by mouth daily.    [provider]  Propylene Glycol (SYSTANE BALANCE) 0.6 % SOLN Apply 1 drop to eye every 12 (twelve) hours as needed (for dry eyes).     [provider]  psyllium (METAMUCIL SMOOTH TEXTURE) 28 % packet Take 1 packet by mouth at bedtime.    [provider]  senna (SENOKOT) 8.6 MG TABS tablet Take 1 tablet by mouth daily.     [provider]  sertraline (ZOLOFT) 50 MG tablet Take 75 mg by mouth daily.    [provider]  tamsulosin (FLOMAX) 0.4 MG CAPS capsule Take 0.4 mg by mouth daily after supper. For enlarged prostate    [provider]    Family History Family History  Problem Relation Age of Onset   Heart failure Mother    Pulmonary embolism Father     Social History Social History   Tobacco Use   Smoking status: Never   Smokeless tobacco: Never  Substance Use Topics   Alcohol use: Yes    Comment: wine occasionally     Allergies   Ciprofloxacin hcl, Cocoa, Codeine, Hydrocodone, Librax [chlordiazepoxide-clidinium], Loperamide hcl, Metaxalone, Penicillins, Sulfa antibiotics, and Tetracyclines & related   Review of  Systems As stated in HPI otherwise negative   Physical Exam Triage Vital Signs ED Triage Vitals  Enc Vitals Group     BP 07/03/21 1615 (!) 159/66     Pulse Rate 07/03/21 1615 61     Resp 07/03/21 1615 15     Temp 07/03/21 1617 97.9 F (36.6 C)     Temp Source 07/03/21 1617 Oral     SpO2 07/03/21 1615 95 %     Weight --      Height --      Head Circumference --      Peak Flow --      Pain Score 07/03/21 1612 8     Pain Loc --  Pain Edu? --      Excl. in [place]? --    No data found.  Updated Vital Signs BP (!) 159/66 (BP Location: Left Arm)    Pulse 61    Temp 97.9 F (36.6 C) (Oral)    Resp 15    SpO2 95%   Visual Acuity Right Eye Distance:   Left Eye Distance:   Bilateral Distance:    Right Eye Near:   Left Eye Near:    Bilateral Near:     Physical Exam Constitutional:      Appearance: He is not toxic-appearing.     Comments: Chronically frail, elderly gentleman sitting in wheelchair in no acute distress  HENT:     Head: Normocephalic and atraumatic.     Mouth/Throat:     Mouth: Mucous membranes are moist.  Eyes:     General: No scleral icterus.    Extraocular Movements: Extraocular movements intact.  Cardiovascular:     Rate and Rhythm: Normal rate and regular rhythm.     Heart sounds:    No friction rub. No gallop.  Pulmonary:     Effort: Pulmonary effort is normal.     Breath sounds: Normal breath sounds. No wheezing, rhonchi or rales.  Abdominal:     General: Bowel sounds are normal.     Palpations: Abdomen is soft.     Tenderness: There is no abdominal tenderness. There is no right CVA tenderness, left CVA tenderness, guarding or rebound.  Musculoskeletal:        General: Tenderness present. No deformity.     Cervical back: Normal range of motion and neck supple.     Comments: TTP upon palpation of right hip.  Difficulty standing due to advanced age and discomfort  Skin:    General: Skin is warm and dry.     Findings: No lesion or rash.   Neurological:     General: No focal deficit present.     Sensory: No sensory deficit.     Motor: No weakness.  Psychiatric:        Mood and Affect: Mood normal.        Behavior: Behavior normal.     UC Treatments / Results  Labs (all labs ordered are listed, but only abnormal results are displayed) Labs Reviewed  POCT URINALYSIS DIPSTICK, ED / UC - Abnormal; Notable for the following components:      Result Value   Ketones, ur TRACE (*)    All other components within normal limits    EKG   Radiology DG Hip Unilat With Pelvis 2-3 Views Right  Result Date: 07/03/2021 CLINICAL DATA:  Right-sided pain. EXAM: DG HIP (WITH OR WITHOUT PELVIS) 2-3V RIGHT COMPARISON:  None. FINDINGS: The bones are diffusely osteopenic. There is no definite acute fracture or dislocation identified. Joint spaces are maintained. There are vascular calcifications in the soft tissues. IMPRESSION: 1. Limited by osteopenia. 2. No definite acute fracture or dislocation. 3. If there is high clinical concern for occult fracture, consider further evaluation with CT. Electronically Signed   By: Ronney Asters M.D.   On: 07/03/2021 17:10    Procedures Procedures (including critical care time)  Medications Ordered in UC Medications - No data to display  Initial Impression / Assessment and Plan / UC Course  I have reviewed the triage vital signs and the nursing notes.  Pertinent labs & imaging results that were available during my care of the patient were reviewed by me and considered in my  medical decision making (see chart for details).  Right hip, flank pain -Daughter reports patient complaining of low back pain for several years however notably worse today with difficulty ambulating due to pain.  Patient is afebrile and nontoxic-appearing.  Exam mostly unremarkable -X-ray negative for fracture.  Urinalysis without any evidence of infection or hematuria that would suggest renal stone -Localized nature of pain  suggests musculoskeletal etiology. -Long discussion with daughter regarding discharge and recommendation for further imaging from radiologist.  Further work-up does not seem to be emergent based on exam though if facility is unable to work-up then would suggest ED sooner than later  Reviewed expections re: course of current medical issues. Questions answered. Outlined signs and symptoms indicating need for more acute intervention. Pt verbalized understanding. AVS given  Final Clinical Impressions(s) / UC Diagnoses   Final diagnoses:  Right hip pain     Discharge Instructions      Unfortunately, I do not see any cause for Mr. Lankford hip pain based on his xray and urinalysis. The radiologist recommended possibly a CT to rule out any fracture. You would need to go to the ER to have him evaluated.      ED Prescriptions   None    PDMP not reviewed this encounter.   Rudolpho Sevin, NP 07/03/21 1755

## 2021-07-03 NOTE — Progress Notes (Signed)
Orthopedic Tech Progress Note Patient Details:  Andre Marsh [birth year] [id number] Ordered brace Patient ID: [name], male   DOB: Mar 23, 1927, 88 y.o.   MRN: [id number]  [name] 07/03/2021, 11:32 PM

## 2021-07-04 DIAGNOSIS — S32049A Unspecified fracture of fourth lumbar vertebra, initial encounter for closed fracture: Secondary | ICD-10-CM | POA: Diagnosis not present

## 2021-07-04 NOTE — Progress Notes (Signed)
Orthopedic Tech Progress Note Patient Details:  MADOC HOLQUIN 04/21/1927 [id number]  Ortho Devices Type of Ortho Device: Lumbar corsett Ortho Device/Splint Location: back Ortho Device/Splint Interventions: Ordered, Application, Adjustment   Post Interventions Patient Tolerated: Fair Instructions Provided: Adjustment of device, Care of device, Poper ambulation with device  Amirah Goerke 07/04/2021, 1:51 AM

## 2021-09-29 ENCOUNTER — Other Ambulatory Visit: Payer: Self-pay

## 2021-09-29 ENCOUNTER — Emergency Department (HOSPITAL_COMMUNITY)
Admission: EM | Admit: 2021-09-29 | Discharge: 2021-09-30 | Disposition: A | Discharge status: Home / Self Care | Attending: Emergency Medicine | Admitting: Emergency Medicine

## 2021-09-29 DIAGNOSIS — S80211A Abrasion, right knee, initial encounter: Secondary | ICD-10-CM | POA: Diagnosis not present

## 2021-09-29 DIAGNOSIS — S32031A Stable burst fracture of third lumbar vertebra, initial encounter for closed fracture: Secondary | ICD-10-CM | POA: Insufficient documentation

## 2021-09-29 DIAGNOSIS — F02818 Dementia in other diseases classified elsewhere, unspecified severity, with other behavioral disturbance: Secondary | ICD-10-CM | POA: Diagnosis not present

## 2021-09-29 DIAGNOSIS — R634 Abnormal weight loss: Secondary | ICD-10-CM | POA: Diagnosis not present

## 2021-09-29 DIAGNOSIS — Z7409 Other reduced mobility: Secondary | ICD-10-CM | POA: Diagnosis not present

## 2021-09-29 DIAGNOSIS — W01198A Fall on same level from slipping, tripping and stumbling with subsequent striking against other object, initial encounter: Secondary | ICD-10-CM | POA: Insufficient documentation

## 2021-09-29 DIAGNOSIS — I2510 Atherosclerotic heart disease of native coronary artery without angina pectoris: Secondary | ICD-10-CM | POA: Diagnosis not present

## 2021-09-29 DIAGNOSIS — G20 Parkinson's disease: Secondary | ICD-10-CM | POA: Insufficient documentation

## 2021-09-29 DIAGNOSIS — J189 Pneumonia, unspecified organism: Secondary | ICD-10-CM | POA: Insufficient documentation

## 2021-09-29 DIAGNOSIS — Y92129 Unspecified place in nursing home as the place of occurrence of the external cause: Secondary | ICD-10-CM | POA: Diagnosis not present

## 2021-09-29 DIAGNOSIS — Z515 Encounter for palliative care: Secondary | ICD-10-CM

## 2021-09-29 DIAGNOSIS — D72829 Elevated white blood cell count, unspecified: Secondary | ICD-10-CM | POA: Diagnosis not present

## 2021-09-29 DIAGNOSIS — Z7982 Long term (current) use of aspirin: Secondary | ICD-10-CM | POA: Diagnosis not present

## 2021-09-29 DIAGNOSIS — S80212A Abrasion, left knee, initial encounter: Secondary | ICD-10-CM | POA: Diagnosis not present

## 2021-09-29 DIAGNOSIS — S3992XA Unspecified injury of lower back, initial encounter: Secondary | ICD-10-CM | POA: Diagnosis present

## 2021-09-29 DIAGNOSIS — N2889 Other specified disorders of kidney and ureter: Secondary | ICD-10-CM | POA: Diagnosis not present

## 2021-09-29 DIAGNOSIS — W19XXXA Unspecified fall, initial encounter: Secondary | ICD-10-CM

## 2021-09-29 DIAGNOSIS — Z79899 Other long term (current) drug therapy: Secondary | ICD-10-CM | POA: Insufficient documentation

## 2021-09-29 DIAGNOSIS — I10 Essential (primary) hypertension: Secondary | ICD-10-CM | POA: Insufficient documentation

## 2021-09-29 DIAGNOSIS — G309 Alzheimer's disease, unspecified: Secondary | ICD-10-CM | POA: Insufficient documentation

## 2021-09-29 NOTE — ED Provider Notes (Signed)
[facility] ?Provider Note ? ? ?CSN: [id number] ?Arrival date & time: 09/29/21  2135 ? ?  ? ?History ? ?Chief Complaint  ?Patient presents with  ? Fall  ? ? ?Andre Marsh is a 88 y.o. male. ? ? ?Fall ? ? ?88 year old male with a history of Alzheimer's dementia, CAD, depression, HLD, HTN, AAA, Parkinson's disease who presents to the emergency department after a fall at his assisted facility earlier today.  The patient is currently on home hospice for functional decline and 20 pound weight loss over the past few months.  He resides at an assisted living facility at [facility].  He has hospice nursing to assist him.  I called the patient's facility and they felt that having increasing gait instability multiple falls today.  He did hit his head on a mattress.  He is not on anticoagulation.  No loss of consciousness.  Staff initially felt that he was at his baseline and ambulatory with a walker at the facility but ultimately wanted him to be evaluated in the emergency department. ? ?Family members bedside state the patient has had a functional decline over the past few months that has been gradual, weight loss of unclear etiology to the point he is now on hospice. The patient arrived to the ED GCS 14, ABC intact, AAOx2. Family members felt him to be at his baseline currently.  ?Home Medications ?Prior to Admission medications   ?Medication Sig Start Date End Date Taking? Authorizing Provider  ?acetaminophen (TYLENOL) 325 MG tablet Take 650 mg by mouth 2 (two) times daily. FOR PAIN    [provider]  ?amLODipine (NORVASC) 5 MG tablet Take 1 tablet (5 mg total) by mouth daily. 07/18/16   Marguerita Merles Latif, DO  ?aspirin 81 MG tablet Take 81 mg by mouth daily.    [provider]  ?atorvastatin (LIPITOR) 20 MG tablet Take 1 tablet (20 mg total) by mouth daily. 07/18/16   Marguerita Merles Latif, DO  ?docusate sodium (COLACE) 100 MG capsule Take 100 mg by mouth at bedtime.  For constipation    [provider]  ?folic acid (FOLVITE) 1 MG tablet Take 1 mg by mouth daily.    [provider]  ?galantamine (RAZADYNE) 4 MG tablet Take 4 mg by mouth daily.    [provider]  ?HYDROmorphone (DILAUDID) 2 MG tablet Take 1 tablet (2 mg total) by mouth every 6 (six) hours as needed for severe pain. 07/03/21   Pricilla Loveless, MD  ?isosorbide mononitrate (IMDUR) 30 MG 24 hr tablet Take 30 mg by mouth daily. For heart disease    [provider]  ?lamoTRIgine (LAMICTAL) 25 MG tablet Take 50 mg by mouth 2 (two) times daily.    [provider]  ?loratadine (CLARITIN) 10 MG tablet Take 10 mg by mouth daily. For allergies    [provider]  ?magnesium hydroxide (MILK OF MAGNESIA) 400 MG/5ML suspension Take 30 mLs by mouth every Monday, Wednesday, and Friday at 8 PM.    [provider]  ?metoprolol tartrate (LOPRESSOR) 25 MG tablet Take 0.5 tablets (12.5 mg total) by mouth 2 (two) times daily. 07/18/16   Marguerita Merles Latif, DO  ?mirtazapine (REMERON) 15 MG tablet Take 15 mg by mouth at bedtime.    [provider]  ?naphazoline-pheniramine (NAPHCON-A) 0.025-0.3 % ophthalmic solution Place 2 drops into both eyes every 8 (eight) hours as needed for irritation (or dry eyes).    [provider]  ?  nitroGLYCERIN (NITROSTAT) 0.4 MG SL tablet Place 0.4 mg under the tongue every 5 (five) minutes as needed for chest pain. CALL 9-1-1 AFTER THREE (3) DOSES    [provider]  ?omeprazole (PRILOSEC) 20 MG capsule Take 20 mg by mouth 2 (two) times daily. For reflux    [provider]  ?oseltamivir (TAMIFLU) 75 MG capsule Take 75 mg by mouth every other day. FOR 8 DAYS FOR PROPHYLAXIS    [provider]  ?paliperidone (INVEGA) 6 MG 24 hr tablet Take 6 mg by mouth daily.    [provider]  ?Propylene Glycol (SYSTANE BALANCE) 0.6 % SOLN Apply 1 drop to eye every 12 (twelve) hours as needed (for dry eyes).      [provider]  ?psyllium (METAMUCIL SMOOTH TEXTURE) 28 % packet Take 1 packet by mouth at bedtime.    [provider]  ?senna (SENOKOT) 8.6 MG TABS tablet Take 1 tablet by mouth daily.     [provider]  ?sertraline (ZOLOFT) 50 MG tablet Take 75 mg by mouth daily.    [provider]  ?tamsulosin (FLOMAX) 0.4 MG CAPS capsule Take 0.4 mg by mouth daily after supper. For enlarged prostate    [provider]  ?   ? ?Allergies    ?Ciprofloxacin hcl, Cocoa, Codeine, Hydrocodone, Librax [chlordiazepoxide-clidinium], Loperamide hcl, Metaxalone, Penicillins, Sulfa antibiotics, and Tetracyclines & related   ? ?Review of Systems   ?Review of Systems  ?Unable to perform ROS: Dementia  ? ?Physical Exam ?Updated Vital Signs ?BP (!) 108/56 (BP Location: Left Arm)   Pulse 60   Temp (!) 97.5 ?F (36.4 ?C) (Oral)   Resp 16   SpO2 100%  ?Physical Exam ?Vitals and nursing note reviewed.  ?Constitutional:   ?   General: He is not in acute distress. ?   Appearance: He is well-developed.  ?   Comments: GCS 14, ABC intact  ?HENT:  ?   Head: Normocephalic and atraumatic.  ?Eyes:  ?   Conjunctiva/sclera: Conjunctivae normal.  ?Neck:  ?   Comments: No midline tenderness to palpation of the cervical spine. ROM intact. ?Cardiovascular:  ?   Rate and Rhythm: Normal rate and regular rhythm.  ?Pulmonary:  ?   Effort: Pulmonary effort is normal. No respiratory distress.  ?   Breath sounds: Normal breath sounds.  ?Chest:  ?   Comments: Chest wall stable and non-tender to AP and lateral compression. Clavicles stable and non-tender to AP compression ?Abdominal:  ?   Palpations: Abdomen is soft.  ?   Tenderness: There is no abdominal tenderness.  ?   Comments: Pelvis stable to lateral compression.  ?Musculoskeletal:     ?   General: No swelling or tenderness.  ?   Cervical back: Neck supple.  ?   Comments: No midline tenderness to palpation of the thoracic or lumbar spine. Extremities atraumatic  with intact ROM with the exception of abrasions to the bilateral knees without significant tenderness to palpation. Ambulatory with assistance with a walker.  ?Skin: ?   General: Skin is warm and dry.  ?   Capillary Refill: Capillary refill takes less than 2 seconds.  ?Neurological:  ?   Mental Status: He is alert.  ?   Comments: AAOx2. At baseline neurologic status. CN II-XII grossly intact. Moving all four extremities spontaneously and sensation grossly intact.  ?Psychiatric:     ?   Mood and Affect: Mood normal.  ? ? ?ED Results / Procedures /  Treatments   ?Labs ?(all labs ordered are listed, but only abnormal results are displayed) ?Labs Reviewed  ?COMPREHENSIVE METABOLIC PANEL  ?CBC WITH DIFFERENTIAL/PLATELET  ?URINALYSIS, ROUTINE W REFLEX MICROSCOPIC  ?AMMONIA  ?BLOOD GAS, VENOUS  ?ETHANOL  ?CBG MONITORING, ED  ? ? ?EKG ?None ? ?Radiology ?No results found. ? ?Procedures ?Procedures  ? ? ?Medications Ordered in ED ?Medications - No data to display ? ?ED Course/ Medical Decision Making/ A&P ?  ?                        ?Medical Decision Making ?Amount and/or Complexity of Data Reviewed ?Labs: ordered. ?Radiology: ordered. ? ? ? ?88 year old male with a history of Alzheimer's dementia, CAD, depression, HLD, HTN, AAA, Parkinson's disease who presents to the emergency department after a fall at his assisted facility earlier today.  The patient is currently on home hospice for functional decline and 20 pound weight loss over the past few months.  He resides at an assisted living facility at [facility].  He has hospice nursing to assist him.  I called the patient's facility and they felt that having increasing gait instability multiple falls today.  He did hit his head on a mattress.  He is not on anticoagulation.  No loss of  consciousness.  Staff initially felt that he was at his baseline and ambulatory with a walker at the facility but ultimately wanted him to be evaluated in the emergency  department. ? ?Family members bedside state the patient has had a functional decline over the past few months that has been gradual, weight loss of unclear etiology to the point he is now on hospice. The patient arrived to the E

## 2021-09-29 NOTE — ED Triage Notes (Signed)
BIB EMS from [place].  Hx of Parkinson's.  Hospice patient.  Pt has reportedly had multiple falls today.  Hit his head on his mattress.  No blood thinners.  Staff wanted him evaluated. Pt has no complaints at this time.  VSS ?

## 2021-09-29 NOTE — ED Notes (Signed)
Family at bedside.  EDP at bedside.  Pt ambulated with walker and stand-by assist with no problems.  ? ?

## 2021-09-30 ENCOUNTER — Emergency Department (HOSPITAL_COMMUNITY)

## 2021-09-30 LAB — BLOOD GAS, VENOUS
Acid-Base Excess: 2.9 mmol/L — ABNORMAL HIGH (ref 0.0–2.0)
Bicarbonate: 29.8 mmol/L — ABNORMAL HIGH (ref 20.0–28.0)
O2 Saturation: 25.7 %
Patient temperature: 37.1
pCO2, Ven: 54 mmHg (ref 44–60)
pH, Ven: 7.35 (ref 7.25–7.43)
pO2, Ven: <31 mmHg — CL (ref 32–45)

## 2021-09-30 LAB — CBC WITH DIFFERENTIAL/PLATELET
Abs Immature Granulocytes: 0.14 10*3/uL — ABNORMAL HIGH (ref 0.00–0.07)
Basophils Absolute: 0.1 10*3/uL (ref 0.0–0.1)
Basophils Relative: 1 %
Eosinophils Absolute: 0.3 10*3/uL (ref 0.0–0.5)
Eosinophils Relative: 3 %
HCT: 37.5 % — ABNORMAL LOW (ref 39.0–52.0)
Hemoglobin: 12.4 g/dL — ABNORMAL LOW (ref 13.0–17.0)
Immature Granulocytes: 1 %
Lymphocytes Relative: 14 %
Lymphs Abs: 1.6 10*3/uL (ref 0.7–4.0)
MCH: 34.8 pg — ABNORMAL HIGH (ref 26.0–34.0)
MCHC: 33.1 g/dL (ref 30.0–36.0)
MCV: 105.3 fL — ABNORMAL HIGH (ref 80.0–100.0)
Monocytes Absolute: 0.7 10*3/uL (ref 0.1–1.0)
Monocytes Relative: 6 %
Neutro Abs: 8.6 10*3/uL — ABNORMAL HIGH (ref 1.7–7.7)
Neutrophils Relative %: 75 %
Platelets: 194 10*3/uL (ref 150–400)
RBC: 3.56 MIL/uL — ABNORMAL LOW (ref 4.22–5.81)
RDW: 14.3 % (ref 11.5–15.5)
WBC: 11.5 10*3/uL — ABNORMAL HIGH (ref 4.0–10.5)
nRBC: 0 % (ref 0.0–0.2)

## 2021-09-30 LAB — COMPREHENSIVE METABOLIC PANEL
ALT: 18 U/L (ref 0–44)
AST: 21 U/L (ref 15–41)
Albumin: 3.8 g/dL (ref 3.5–5.0)
Alkaline Phosphatase: 105 U/L (ref 38–126)
Anion gap: 5 (ref 5–15)
BUN: 30 mg/dL — ABNORMAL HIGH (ref 8–23)
CO2: 28 mmol/L (ref 22–32)
Calcium: 9.5 mg/dL (ref 8.9–10.3)
Chloride: 108 mmol/L (ref 98–111)
Creatinine, Ser: 1.17 mg/dL (ref 0.61–1.24)
GFR, Estimated: 58 mL/min — ABNORMAL LOW (ref >60)
Glucose, Bld: 134 mg/dL — ABNORMAL HIGH (ref 70–99)
Potassium: 4.8 mmol/L (ref 3.5–5.1)
Sodium: 141 mmol/L (ref 135–145)
Total Bilirubin: 0.6 mg/dL (ref 0.3–1.2)
Total Protein: 6.7 g/dL (ref 6.5–8.1)

## 2021-09-30 LAB — AMMONIA: Ammonia: <10 umol/L (ref 9–35)

## 2021-09-30 LAB — LIPASE, BLOOD: Lipase: 32 U/L (ref 11–51)

## 2021-09-30 LAB — ETHANOL: Alcohol, Ethyl (B): <10 mg/dL (ref <10)

## 2021-09-30 MED ORDER — SODIUM CHLORIDE (PF) 0.9 % IJ SOLN
INTRAMUSCULAR | Status: AC
  Filled 2021-09-30: qty 50

## 2021-09-30 MED ORDER — ONDANSETRON 4 MG PO TBDP: 4.0000 mg | ORAL_TABLET | Freq: Three times a day (TID) | ORAL | 0 refills | Status: AC | PRN

## 2021-09-30 MED ORDER — LEVOFLOXACIN IN D5W 750 MG/150ML IV SOLN
750.0000 mg | Freq: Once | INTRAVENOUS | Status: AC
  Administered 2021-09-30: 750 mg via INTRAVENOUS
  Filled 2021-09-30: qty 150

## 2021-09-30 MED ORDER — LEVOFLOXACIN 750 MG PO TABS: 750.0000 mg | ORAL_TABLET | Freq: Every day | ORAL | 0 refills | Status: AC

## 2021-09-30 MED ORDER — IOHEXOL 300 MG/ML  SOLN
100.0000 mL | Freq: Once | INTRAMUSCULAR | Status: AC | PRN
  Administered 2021-09-30: 100 mL via INTRAVENOUS

## 2021-09-30 NOTE — ED Provider Notes (Signed)
I assumed care of this patient.  Please see previous provider note for further details of Hx, PE.  Briefly patient is a 88 y.o. male who presented after a fall. ? ?Pending labs and imaging ?CT head and cervical spine negative for any acute injuries ?Chest x-ray notable for evidence of pneumonia ?CBC with leukocytosis.  No anemia ?No significant electrolyte derangements ?CT of the chest abdomen pelvis notable for new L3 burst fracture.  Patient still has good lower extremity strength.  Left renal mass noted.  Family updated ? ?Treated for pneumonia ? ?The patient appears reasonably screened and/or stabilized for discharge and I doubt any other medical condition or other [facility] requiring further screening, evaluation, or treatment in the ED at this time prior to discharge. Safe for discharge with strict return precautions. ? ?Disposition: Discharge ? ?Condition: Good ? ?I have discussed the results, Dx and Tx plan with the patient/family who expressed understanding and agree(s) with the plan. Discharge instructions discussed at length. The patient/family was given strict return precautions who verbalized understanding of the instructions. No further questions at time of discharge.  ? ? ?ED Discharge Orders   ? ?      Ordered  ?  levofloxacin (LEVAQUIN) 750 MG tablet  Daily       ? 09/30/21 0443  ?  ondansetron (ZOFRAN-ODT) 4 MG disintegrating tablet  Every 8 hours PRN       ? 09/30/21 0443  ? ?  ?  ? ?  ? ? ? ? ?Follow Up: ?Ashley Royalty, NP ?[address] [address] ?[phone] ? ?Call  ?to schedule an appointment for close follow up ? ? ? ? ? ? ?  ?Nira Conn, MD ?09/30/21 [phone] ? ?

## 2022-02-28 ENCOUNTER — Other Ambulatory Visit: Payer: Self-pay

## 2022-02-28 ENCOUNTER — Observation Stay (HOSPITAL_COMMUNITY)
Admission: EM | Admit: 2022-02-28 | Discharge: 2022-03-01 | DRG: 951 | Disposition: A | Discharge status: Hospice | Source: Skilled Nursing Facility | Attending: Family Medicine | Admitting: Family Medicine

## 2022-02-28 ENCOUNTER — Emergency Department (HOSPITAL_COMMUNITY)

## 2022-02-28 DIAGNOSIS — I472 Ventricular tachycardia, unspecified: Secondary | ICD-10-CM

## 2022-02-28 DIAGNOSIS — Z6821 Body mass index (BMI) 21.0-21.9, adult: Secondary | ICD-10-CM

## 2022-02-28 DIAGNOSIS — N400 Enlarged prostate without lower urinary tract symptoms: Secondary | ICD-10-CM | POA: Diagnosis present

## 2022-02-28 DIAGNOSIS — Z885 Allergy status to narcotic agent status: Secondary | ICD-10-CM

## 2022-02-28 DIAGNOSIS — Z515 Encounter for palliative care: Principal | ICD-10-CM

## 2022-02-28 DIAGNOSIS — Z880 Allergy status to penicillin: Secondary | ICD-10-CM | POA: Diagnosis not present

## 2022-02-28 DIAGNOSIS — Z66 Do not resuscitate: Secondary | ICD-10-CM | POA: Diagnosis present

## 2022-02-28 DIAGNOSIS — F0283 Dementia in other diseases classified elsewhere, unspecified severity, with mood disturbance: Secondary | ICD-10-CM | POA: Diagnosis present

## 2022-02-28 DIAGNOSIS — E785 Hyperlipidemia, unspecified: Secondary | ICD-10-CM | POA: Diagnosis not present

## 2022-02-28 DIAGNOSIS — I714 Abdominal aortic aneurysm, without rupture, unspecified: Secondary | ICD-10-CM | POA: Diagnosis not present

## 2022-02-28 DIAGNOSIS — F419 Anxiety disorder, unspecified: Secondary | ICD-10-CM | POA: Diagnosis not present

## 2022-02-28 DIAGNOSIS — K219 Gastro-esophageal reflux disease without esophagitis: Secondary | ICD-10-CM | POA: Diagnosis present

## 2022-02-28 DIAGNOSIS — F319 Bipolar disorder, unspecified: Secondary | ICD-10-CM | POA: Diagnosis not present

## 2022-02-28 DIAGNOSIS — Z79899 Other long term (current) drug therapy: Secondary | ICD-10-CM

## 2022-02-28 DIAGNOSIS — Z7982 Long term (current) use of aspirin: Secondary | ICD-10-CM

## 2022-02-28 DIAGNOSIS — R569 Unspecified convulsions: Secondary | ICD-10-CM | POA: Diagnosis not present

## 2022-02-28 DIAGNOSIS — I10 Essential (primary) hypertension: Secondary | ICD-10-CM | POA: Diagnosis not present

## 2022-02-28 DIAGNOSIS — R112 Nausea with vomiting, unspecified: Secondary | ICD-10-CM | POA: Diagnosis present

## 2022-02-28 DIAGNOSIS — R64 Cachexia: Secondary | ICD-10-CM | POA: Diagnosis present

## 2022-02-28 DIAGNOSIS — Z888 Allergy status to other drugs, medicaments and biological substances status: Secondary | ICD-10-CM

## 2022-02-28 DIAGNOSIS — I2510 Atherosclerotic heart disease of native coronary artery without angina pectoris: Secondary | ICD-10-CM | POA: Diagnosis present

## 2022-02-28 DIAGNOSIS — G309 Alzheimer's disease, unspecified: Secondary | ICD-10-CM | POA: Diagnosis present

## 2022-02-28 DIAGNOSIS — G20 Parkinson's disease: Secondary | ICD-10-CM | POA: Diagnosis not present

## 2022-02-28 DIAGNOSIS — R7401 Elevation of levels of liver transaminase levels: Secondary | ICD-10-CM | POA: Diagnosis not present

## 2022-02-28 DIAGNOSIS — R001 Bradycardia, unspecified: Secondary | ICD-10-CM | POA: Diagnosis not present

## 2022-02-28 DIAGNOSIS — Z8249 Family history of ischemic heart disease and other diseases of the circulatory system: Secondary | ICD-10-CM | POA: Diagnosis not present

## 2022-02-28 DIAGNOSIS — Z882 Allergy status to sulfonamides status: Secondary | ICD-10-CM | POA: Diagnosis not present

## 2022-02-28 DIAGNOSIS — I442 Atrioventricular block, complete: Secondary | ICD-10-CM | POA: Diagnosis present

## 2022-02-28 LAB — CBC WITH DIFFERENTIAL/PLATELET
Abs Immature Granulocytes: 0.03 10*3/uL (ref 0.00–0.07)
Basophils Absolute: 0 10*3/uL (ref 0.0–0.1)
Basophils Relative: 0 %
Eosinophils Absolute: 0 10*3/uL (ref 0.0–0.5)
Eosinophils Relative: 0 %
HCT: 35.6 % — ABNORMAL LOW (ref 39.0–52.0)
Hemoglobin: 11.5 g/dL — ABNORMAL LOW (ref 13.0–17.0)
Immature Granulocytes: 1 %
Lymphocytes Relative: 7 %
Lymphs Abs: 0.4 10*3/uL — ABNORMAL LOW (ref 0.7–4.0)
MCH: 33.7 pg (ref 26.0–34.0)
MCHC: 32.3 g/dL (ref 30.0–36.0)
MCV: 104.4 fL — ABNORMAL HIGH (ref 80.0–100.0)
Monocytes Absolute: 0.2 10*3/uL (ref 0.1–1.0)
Monocytes Relative: 4 %
Neutro Abs: 4.4 10*3/uL (ref 1.7–7.7)
Neutrophils Relative %: 88 %
Platelets: 125 10*3/uL — ABNORMAL LOW (ref 150–400)
RBC: 3.41 MIL/uL — ABNORMAL LOW (ref 4.22–5.81)
RDW: 14.4 % (ref 11.5–15.5)
WBC: 4.9 10*3/uL (ref 4.0–10.5)
nRBC: 0 % (ref 0.0–0.2)

## 2022-02-28 LAB — COMPREHENSIVE METABOLIC PANEL
ALT: 50 U/L — ABNORMAL HIGH (ref 0–44)
AST: 92 U/L — ABNORMAL HIGH (ref 15–41)
Albumin: 3.8 g/dL (ref 3.5–5.0)
Alkaline Phosphatase: 150 U/L — ABNORMAL HIGH (ref 38–126)
Anion gap: 13 (ref 5–15)
BUN: 25 mg/dL — ABNORMAL HIGH (ref 8–23)
CO2: 18 mmol/L — ABNORMAL LOW (ref 22–32)
Calcium: 9 mg/dL (ref 8.9–10.3)
Chloride: 111 mmol/L (ref 98–111)
Creatinine, Ser: 1.39 mg/dL — ABNORMAL HIGH (ref 0.61–1.24)
GFR, Estimated: 47 mL/min — ABNORMAL LOW (ref >60)
Glucose, Bld: 211 mg/dL — ABNORMAL HIGH (ref 70–99)
Potassium: 3.4 mmol/L — ABNORMAL LOW (ref 3.5–5.1)
Sodium: 142 mmol/L (ref 135–145)
Total Bilirubin: 0.9 mg/dL (ref 0.3–1.2)
Total Protein: 6.1 g/dL — ABNORMAL LOW (ref 6.5–8.1)

## 2022-02-28 LAB — TROPONIN I (HIGH SENSITIVITY): Troponin I (High Sensitivity): 214 ng/L (ref <18)

## 2022-02-28 LAB — MAGNESIUM: Magnesium: 2.1 mg/dL (ref 1.7–2.4)

## 2022-02-28 MED ORDER — ONDANSETRON HCL 4 MG/2ML IJ SOLN
4.0000 mg | Freq: Once | INTRAMUSCULAR | Status: AC | PRN
Start: 2022-02-28 — End: 2022-02-28
  Administered 2022-02-28: 4 mg via INTRAVENOUS
  Filled 2022-02-28: qty 2

## 2022-02-28 MED ORDER — AMIODARONE LOAD VIA INFUSION
150.0000 mg | Freq: Once | INTRAVENOUS | Status: AC
Start: 2022-02-28 — End: 2022-02-28
  Administered 2022-02-28: 150 mg via INTRAVENOUS

## 2022-02-28 MED ORDER — MAGNESIUM SULFATE 2 GM/50ML IV SOLN
2.0000 g | Freq: Once | INTRAVENOUS | Status: AC
  Administered 2022-02-28: 2 g via INTRAVENOUS
  Filled 2022-02-28: qty 50

## 2022-02-28 MED ORDER — AMIODARONE HCL IN DEXTROSE 360-4.14 MG/200ML-% IV SOLN
30.0000 mg/h | INTRAVENOUS | Status: DC
Start: 2022-03-01 — End: 2022-02-28

## 2022-02-28 MED ORDER — ONDANSETRON HCL 4 MG/2ML IJ SOLN
4.0000 mg | Freq: Once | INTRAMUSCULAR | Status: AC
  Administered 2022-02-28: 4 mg via INTRAVENOUS
  Filled 2022-02-28: qty 2

## 2022-02-28 MED ORDER — POTASSIUM CHLORIDE 10 MEQ/100ML IV SOLN
10.0000 meq | INTRAVENOUS | Status: AC
  Administered 2022-02-28 – 2022-03-01 (×4): 10 meq via INTRAVENOUS
  Filled 2022-02-28 (×4): qty 100

## 2022-02-28 MED ORDER — LACTATED RINGERS IV BOLUS
1000.0000 mL | Freq: Once | INTRAVENOUS | Status: AC
  Administered 2022-02-28: 1000 mL via INTRAVENOUS

## 2022-02-28 MED ORDER — AMIODARONE HCL IN DEXTROSE 360-4.14 MG/200ML-% IV SOLN
60.0000 mg/h | INTRAVENOUS | Status: DC
  Administered 2022-02-28: 60 mg/h via INTRAVENOUS

## 2022-02-28 NOTE — H&P (Signed)
History and Physical    Andre Marsh DOB: 1927/05/06 DOA: 02/28/2022  PCP: Andre Royalty, NP  Patient coming from: ALF  Chief Complaint: AMS, Vtach  HPI: Andre Marsh is a 88 y.o. male with medical history significant of alzheimer's disease, AAA, BPH, CAD, depression, falls, HLD, HTN who presents from his memory care unit for vomiting, decreased awareness, and gaze diversion.  He was found to be going in and out of VTach.  Amiodarone was started, but the IV infiltrated, so unclear how much amiodarone he initially had on board.  He was also found to be in complete heart block by EKG.  Labs showed a low K, bicarb of 18, Cr of 1.39, elevated LFTs, Mag of 2.1.  TnI was 214.  H/H were 11 and 35.6.    ED Course: EDP, Dr. Nedra Hai discussed this patient with Cardiology and family.  Family (son and daughter) felt that PCM was not in patient's goals of care.  They requested patient be transitioned to DNR and admitted for further discussion with palliative and hospice care.    Review of Systems: As per HPI otherwise all other systems reviewed and are negative.  Patient is aware of name and able to follow simple commands.    Past Medical History:  Diagnosis Date   AAA (abdominal aortic aneurysm)    Alzheimer disease (HCC)    Anxiety    Bipolar 1 disorder (HCC)    CAD (coronary artery disease)    a. patient reported nonobstructive CAD. b. patient reported cath 2014 @ [facility]   Cataract    bilateral   Depression    Fall    GERD (gastroesophageal reflux disease)    Hyperlipidemia    Hypertension    Paralysis agitans (HCC)     Past Surgical History:  Procedure Laterality Date   CATARACT EXTRACTION Left     Social History  reports that he has never smoked. He has never used smokeless tobacco. He reports current alcohol use. No history on file for drug use.  Allergies  Allergen Reactions   Ciprofloxacin Hcl Nausea And Vomiting   Cocoa Other (See Comments)    Unknown; listed  in MAR   Codeine Other (See Comments)    Unknown; listed in MAR   Hydrocodone Other (See Comments)    Unknown; listed in MAR   Librax [Chlordiazepoxide-Clidinium] Other (See Comments)    Unknown; listed in MAR   Loperamide Hcl Other (See Comments)    Unknown; listed in [facility]   Metaxalone Other (See Comments)    Unknown; listed in MAR   Penicillins Other (See Comments)    Unknown; listed in MAR   Sulfa Antibiotics Nausea And Vomiting and Rash   Tetracyclines & Related Rash    Family History  Problem Relation Age of Onset   Heart failure Mother    Pulmonary embolism Father     Prior to Admission medications   Medication Sig Start Date End Date Taking? Authorizing Provider  acetaminophen (TYLENOL) 500 MG tablet Take 1,000 mg by mouth 3 (three) times daily. FOR PAIN    [provider]  amLODipine (NORVASC) 5 MG tablet Take 1 tablet (5 mg total) by mouth daily. 07/18/16   Marguerita Merles Latif, DO  aspirin 81 MG tablet Take 81 mg by mouth daily.    [provider]  buPROPion (WELLBUTRIN) 75 MG tablet Take 75 mg by mouth every evening. 09/17/21   [provider]  busPIRone (BUSPAR) 10 MG tablet  Take 10 mg by mouth 3 (three) times daily. 09/15/21   [provider]  diclofenac Sodium (VOLTAREN) 1 % GEL Apply 2 g topically 4 (four) times daily. Apply to lower back 09/12/21   [provider]  finasteride (PROSCAR) 5 MG tablet Take 5 mg by mouth every evening. 09/09/21   [provider]  HYDROmorphone (DILAUDID) 2 MG tablet Take 1 tablet (2 mg total) by mouth every 6 (six) hours as needed for severe pain. 07/03/21   Sherwood Gambler, MD  lamoTRIgine (LAMICTAL) 25 MG tablet Take 50 mg by mouth daily.    [provider]  loratadine (CLARITIN) 10 MG tablet Take 10 mg by mouth daily. For allergies    [provider]  metoprolol tartrate (LOPRESSOR) 25 MG tablet Take 0.5 tablets (12.5 mg total) by mouth 2 (two) times daily. Patient taking  differently: Take 12.5 mg by mouth every evening. 07/18/16   Raiford Noble Latif, DO  mirtazapine (REMERON) 30 MG tablet Take 30 mg by mouth at bedtime.    [provider]  nitroGLYCERIN (NITROSTAT) 0.4 MG SL tablet Place 0.4 mg under the tongue every 5 (five) minutes as needed for chest pain. CALL 9-1-1 AFTER THREE (3) DOSES    [provider]  paliperidone (INVEGA) 6 MG 24 hr tablet Take 6 mg by mouth daily.    [provider]  pantoprazole (PROTONIX) 40 MG tablet Take 40 mg by mouth daily. 09/15/21   [provider]  psyllium (METAMUCIL SMOOTH TEXTURE) 28 % packet Take 1 packet by mouth at bedtime.    [provider]  rosuvastatin (CRESTOR) 5 MG tablet Take 5 mg by mouth daily. 09/13/21   [provider]  senna (SENOKOT) 8.6 MG TABS tablet Take 1 tablet by mouth daily.    [provider]  sertraline (ZOLOFT) 100 MG tablet Take 200 mg by mouth daily. 09/23/21   [provider]  tamsulosin (FLOMAX) 0.4 MG CAPS capsule Take 0.4 mg by mouth daily after supper. For enlarged prostate    [provider]    Physical Exam: Vitals:   02/28/22 1915 02/28/22 2000 02/28/22 2100 02/28/22 2200  BP:  (!) 136/45 (!) 143/51 (!) 138/47  Pulse: (!) 28 (!) 41 (!) 34 (!) 40  Resp: (!) 36 (!) 27 18 17   Temp:      TempSrc:      SpO2: 97% 97% 99% 98%    Constitutional: Elderly man, lying in bed, somnolent.  Eyes: lids normal, conjunctival injection ENMT: Mucous membranes are dry Neck: normal, supple, Respiratory: Course breath sounds at bases, no wheezing Cardiovascular: Bradycardic, no murmur, Zol pads in place, 1+ pitting to the mid legs Abdomen: Scaphoid, non tender, +BS Musculoskeletal: Decreased muscle bulk, no obvious change to muscle tone Skin: Chronic skin changes, appears pale, chronic LE edema with changes related.  Neurologic: No focal deficit Psychiatric: Alert to voice and name, appears fatigued.    Labs on  Admission: I have personally reviewed following labs and imaging studies  CBC: Recent Labs  Lab 02/28/22 1915  WBC 4.9  NEUTROABS 4.4  HGB 11.5*  HCT 35.6*  MCV 104.4*  PLT 125*    Basic Metabolic Panel: Recent Labs  Lab 02/28/22 1915  NA 142  K 3.4*  CL 111  CO2 18*  GLUCOSE 211*  BUN 25*  CREATININE 1.39*  CALCIUM 9.0  MG 2.1    GFR: CrCl cannot be calculated (Unknown ideal weight.).  Liver Function Tests: Recent Labs  Lab 02/28/22 1915  AST 92*  ALT 50*  ALKPHOS 150*  BILITOT 0.9  PROT 6.1*  ALBUMIN 3.8    Urine analysis:    Component Value Date/Time   COLORURINE YELLOW 07/03/2021 2249   APPEARANCEUR CLEAR 07/03/2021 2249   LABSPEC >1.030 (H) 07/03/2021 2249   PHURINE 5.5 07/03/2021 2249   GLUCOSEU NEGATIVE 07/03/2021 2249   HGBUR NEGATIVE 07/03/2021 2249   BILIRUBINUR NEGATIVE 07/03/2021 2249   KETONESUR NEGATIVE 07/03/2021 2249   PROTEINUR NEGATIVE 07/03/2021 2249   UROBILINOGEN 0.2 07/03/2021 1640   NITRITE NEGATIVE 07/03/2021 2249   LEUKOCYTESUR NEGATIVE 07/03/2021 2249    Radiological Exams on Admission: DG Chest Port 1 View  Result Date: 02/28/2022 CLINICAL DATA:  Chest pain, cardiac arrhythmia, seizures EXAM: PORTABLE CHEST 1 VIEW COMPARISON:  09/30/2021 FINDINGS: Transverse diameter of heart is increased. Central pulmonary vessels are prominent. Increased interstitial markings are seen in right parahilar region and both lower lung fields. There is blunting of right lateral CP angle. There is no pneumothorax. IMPRESSION: Increased interstitial markings in right parahilar region and both lower lung fields may suggest scarring or pneumonia. Small right pleural effusion. Electronically Signed   By: Elmer Picker M.D.   On: 02/28/2022 19:24    EKG: Independently reviewed. CHB  Assessment/Plan  Complete heart block Ventricular tachycardia Bradycardia - After magnesium and potassium supplementation, will stop IV - Can remove zol pads  for comfort - Comfort measures initiated - Transition to palliative monitoring only - Med surg bed - DNR  End of life care - Palliative care consult in the AM, order placed.  - I discussed with son and daughter who were present at bedside that if he were to go into Ventricular tachycardia again tonight, he would likely pass in the hospital.  They were understanding of this.     DVT prophylaxis: SCDs only, stop once on the floor  Code Status:   DNR  Family Communication:  Precious Bard and Yvone Neu at bedside, daughter and son  Disposition Plan:   Patient is from:  SNF  Anticipated DC to:  SNF with hospice, vs. Inpatient hospice  Anticipated DC date:  03/01/22  Anticipated DC barriers: Availability of bed  Consults called:  Palliative care, in the AM.  Cardiology, EDP spoke with them.   Admission status:  Obs, med surg   Severity of Illness: The appropriate patient status for this patient is OBSERVATION. Observation status is judged to be reasonable and necessary in order to provide the required intensity of service to ensure the patient's safety. The patient's presenting symptoms, physical exam findings, and initial radiographic and laboratory data in the context of their medical condition is felt to place them at decreased risk for further clinical deterioration. Furthermore, it is anticipated that the patient will be medically stable for discharge from the hospital within 2 midnights of admission.     Gilles Chiquito MD [facility]  How to contact the [facility] Attending or Consulting provider [place] or covering provider during after hours [place], for this patient?   Check the care team in [facility] and look for a) attending/consulting [facility] provider listed and b) the [facility] team listed Log into [email or web address] and use [place]'s universal password to access. If you do not have the password, please contact the hospital operator. Locate the [facility] provider you are looking for under [facility] and page  to a number that you can be directly reached. If you still have difficulty reaching the provider, please page  the [facility] (Director on Call) for the Hospitalists listed on amion for assistance.  02/28/2022, 10:47 PM

## 2022-02-28 NOTE — ED Notes (Signed)
The pt opens his eyes and nods yes and no for questions otherwise he is not talking

## 2022-02-28 NOTE — Consult Note (Signed)
Cardiology Consultation   Patient ID: Andre Marsh MRN: [id number]; DOB: September 11, 1926  Admit date: 02/28/2022 Date of Consult: 02/28/2022  PCP:  Ashley Royalty, NP   [facility] Providers Cardiologist:  None   { Click here to update MD or APP on Care Team, Refresh:1}     Patient Profile:   Andre Marsh is a 88 y.o. male with a hx of alzheimer's disease, AAA, BPH, CAD, depression, falls, HLD, HTN who presents from his memory care unit for vomiting,  who is being seen 02/28/2022 for the evaluation of wide-complex tachycardia status post DCCV in the setting of complete heart block with bigeminy at the request of emergency room physician.  History of Present Illness:   Mr. Lamb ***   Past Medical History:  Diagnosis Date   AAA (abdominal aortic aneurysm)    Alzheimer disease (HCC)    Anxiety    Bipolar 1 disorder (HCC)    CAD (coronary artery disease)    a. patient reported nonobstructive CAD. b. patient reported cath 2014 @ [facility]   Cataract    bilateral   Depression    Fall    GERD (gastroesophageal reflux disease)    Hyperlipidemia    Hypertension    Paralysis agitans (HCC)     Past Surgical History:  Procedure Laterality Date   CATARACT EXTRACTION Left      {Home Medications (Optional):21181}  Inpatient Medications: Scheduled Meds:  Continuous Infusions:  potassium chloride 10 mEq (02/28/22 2312)   PRN Meds:   Allergies:    Allergies  Allergen Reactions   Ciprofloxacin Hcl Nausea And Vomiting   Cocoa Other (See Comments)    Unknown; listed in MAR   Codeine Other (See Comments)    Unknown; listed in MAR   Hydrocodone Other (See Comments)    Unknown; listed in MAR   Librax [Chlordiazepoxide-Clidinium] Other (See Comments)    Unknown; listed in MAR   Loperamide Hcl Other (See Comments)    Unknown; listed in MAR   Metaxalone Other (See Comments)    Unknown; listed in MAR   Penicillins Other (See Comments)    Unknown; listed in MAR    Sulfa Antibiotics Nausea And Vomiting and Rash   Tetracyclines & Related Rash    Social History:   Social History   Socioeconomic History   Marital status: Widowed    Spouse name: Not on file   Number of children: Not on file   Years of education: Not on file   Highest education level: Not on file  Occupational History   Not on file  Tobacco Use   Smoking status: Never   Smokeless tobacco: Never  Substance and Sexual Activity   Alcohol use: Yes    Comment: wine occasionally   Drug use: Not on file   Sexual activity: Not on file  Other Topics Concern   Not on file  Social History Narrative   Lives in a nursing home.  Has a daughter and a son.    Social Determinants of Health   Financial Resource Strain: Not on file  Food Insecurity: Not on file  Transportation Needs: Not on file  Physical Activity: Not on file  Stress: Not on file  Social Connections: Not on file  Intimate Partner Violence: Not on file    Family History:   *** Family History  Problem Relation Age of Onset   Heart failure Mother    Pulmonary embolism Father      ROS:  Please see the history of present illness.  *** All other ROS reviewed and negative.     Physical Exam/Data:   Vitals:   02/28/22 2100 02/28/22 2145 02/28/22 2200 02/28/22 2215  BP: (!) 143/51 (!) 137/50 (!) 138/47 (!) 137/52  Pulse: (!) 34 (!) 40 (!) 40 (!) 40  Resp: 18 17 17  (!) 9  Temp:      TempSrc:      SpO2: 99% 100% 98% 100%   No intake or output data in the 24 hours ending 02/28/22 2357    07/03/2021    7:20 PM 07/17/2016    2:52 PM 08/30/2015   11:30 AM  Last 3 Weights  Weight (lbs) 119 lb 14.9 oz 120 lb 122 lb  Weight (kg) 54.4 kg 54.432 kg 55.339 kg     There is no height or weight on file to calculate BMI.  General:  Well nourished, well developed, in no acute distress*** HEENT: normal Neck: no JVD Vascular: No carotid bruits; Distal pulses 2+ bilaterally Cardiac:  normal S1, S2; RRR; no murmur  *** Lungs:  clear to auscultation bilaterally, no wheezing, rhonchi or rales  Abd: soft, nontender, no hepatomegaly  Ext: no edema Musculoskeletal:  No deformities, BUE and BLE strength normal and equal Skin: warm and dry  Neuro:  CNs 2-12 intact, no focal abnormalities noted Psych:  Normal affect   EKG:  The EKG was personally reviewed and demonstrates:  *** Telemetry:  Telemetry was personally reviewed and demonstrates:  ***  Relevant CV Studies: ***  Laboratory Data:  High Sensitivity Troponin:   Recent Labs  Lab 02/28/22 1915  TROPONINIHS 214*     Chemistry Recent Labs  Lab 02/28/22 1915  NA 142  K 3.4*  CL 111  CO2 18*  GLUCOSE 211*  BUN 25*  CREATININE 1.39*  CALCIUM 9.0  MG 2.1  GFRNONAA 47*  ANIONGAP 13    Recent Labs  Lab 02/28/22 1915  PROT 6.1*  ALBUMIN 3.8  AST 92*  ALT 50*  ALKPHOS 150*  BILITOT 0.9   Lipids No results for input(s): "CHOL", "TRIG", "HDL", "LABVLDL", "LDLCALC", "CHOLHDL" in the last 168 hours.  Hematology Recent Labs  Lab 02/28/22 1915  WBC 4.9  RBC 3.41*  HGB 11.5*  HCT 35.6*  MCV 104.4*  MCH 33.7  MCHC 32.3  RDW 14.4  PLT 125*   Thyroid No results for input(s): "TSH", "FREET4" in the last 168 hours.  BNPNo results for input(s): "BNP", "PROBNP" in the last 168 hours.  DDimer No results for input(s): "DDIMER" in the last 168 hours.   Radiology/Studies:  DG Chest Port 1 View  Result Date: 02/28/2022 CLINICAL DATA:  Chest pain, cardiac arrhythmia, seizures EXAM: PORTABLE CHEST 1 VIEW COMPARISON:  09/30/2021 FINDINGS: Transverse diameter of heart is increased. Central pulmonary vessels are prominent. Increased interstitial markings are seen in right parahilar region and both lower lung fields. There is blunting of right lateral CP angle. There is no pneumothorax. IMPRESSION: Increased interstitial markings in right parahilar region and both lower lung fields may suggest scarring or pneumonia. Small right pleural  effusion. Electronically Signed   By: 10/02/2021 M.D.   On: 02/28/2022 19:24     Assessment and Plan:   ***   Risk Assessment/Risk Scores:  {Complete the following score calculators/questions to meet required metrics.  Press F2         :03/02/2022   {Is the patient being seen for unstable angina, ACS, NSTEMI or STEMI?:[phone]} {Does  this patient have CHF or CHF symptoms?      :[id number] {Does this patient have ATRIAL FIBRILLATION?[phone]}  {Are we signing off today?:[id number]}  For questions or updates, please contact [place] Please consult [email or web address] for contact info under    Signed, Warren Danes, MD  02/28/2022 11:57 PM

## 2022-02-28 NOTE — ED Triage Notes (Signed)
Pt BIB EMS due to a seizure. Pt is from [facility]. Pt has been vomiting since 2pm. Pt had a witnessed vomitting episode and seizure like activity. Pt went off into a gaze asnd ewwnt into Vtach. pT HAD 3-4 EPISODES OF VTACH and then would self convert. Pt has 150 of amniodorone on arrival. Pt is alert and oriented.

## 2022-02-28 NOTE — ED Provider Notes (Signed)
[facility] EMERGENCY DEPARTMENT Provider Note   CSN: [id number] Arrival date & time: 02/28/22  1905     History  Chief Complaint  Patient presents with   Seizures    Andre Marsh is a 88 y.o. male. Presenting via EMS from nursing facility.  Approximately 3 hours prior to arrival patient began to have nausea and vomiting and weakness.  On EMS arrived and patient was found to be only in V. tach.  He had multiple episodes of emesis.  During episodes of V. tach patient would become lethargic and less responsive, with concern for possible seizure activity.  However there was no tonic-clonic activity noted.  Seizures      Home Medications Prior to Admission medications   Medication Sig Start Date End Date Taking? Authorizing Provider  acetaminophen (TYLENOL) 500 MG tablet Take 1,000 mg by mouth 3 (three) times daily. FOR PAIN    [provider]  amLODipine (NORVASC) 5 MG tablet Take 1 tablet (5 mg total) by mouth daily. 07/18/16   Marguerita Merles Latif, DO  aspirin 81 MG tablet Take 81 mg by mouth daily.    [provider]  buPROPion (WELLBUTRIN) 75 MG tablet Take 75 mg by mouth every evening. 09/17/21   [provider]  busPIRone (BUSPAR) 10 MG tablet Take 10 mg by mouth 3 (three) times daily. 09/15/21   [provider]  diclofenac Sodium (VOLTAREN) 1 % GEL Apply 2 g topically 4 (four) times daily. Apply to lower back 09/12/21   [provider]  finasteride (PROSCAR) 5 MG tablet Take 5 mg by mouth every evening. 09/09/21   [provider]  HYDROmorphone (DILAUDID) 2 MG tablet Take 1 tablet (2 mg total) by mouth every 6 (six) hours as needed for severe pain. 07/03/21   Pricilla Loveless, MD  lamoTRIgine (LAMICTAL) 25 MG tablet Take 50 mg by mouth daily.    [provider]  loratadine (CLARITIN) 10 MG tablet Take 10 mg by mouth daily. For allergies    [provider]  metoprolol tartrate (LOPRESSOR) 25 MG  tablet Take 0.5 tablets (12.5 mg total) by mouth 2 (two) times daily. Patient taking differently: Take 12.5 mg by mouth every evening. 07/18/16   Marguerita Merles Latif, DO  mirtazapine (REMERON) 30 MG tablet Take 30 mg by mouth at bedtime.    [provider]  nitroGLYCERIN (NITROSTAT) 0.4 MG SL tablet Place 0.4 mg under the tongue every 5 (five) minutes as needed for chest pain. CALL 9-1-1 AFTER THREE (3) DOSES    [provider]  paliperidone (INVEGA) 6 MG 24 hr tablet Take 6 mg by mouth daily.    [provider]  pantoprazole (PROTONIX) 40 MG tablet Take 40 mg by mouth daily. 09/15/21   [provider]  psyllium (METAMUCIL SMOOTH TEXTURE) 28 % packet Take 1 packet by mouth at bedtime.    [provider]  rosuvastatin (CRESTOR) 5 MG tablet Take 5 mg by mouth daily. 09/13/21   [provider]  senna (SENOKOT) 8.6 MG TABS tablet Take 1 tablet by mouth daily.    [provider]  sertraline (ZOLOFT) 100 MG tablet Take 200 mg by mouth daily. 09/23/21   [provider]  tamsulosin (FLOMAX) 0.4 MG CAPS capsule Take 0.4 mg by mouth daily after supper. For enlarged prostate    [provider]      Allergies    Ciprofloxacin hcl, Cocoa, Codeine, Hydrocodone, Librax [chlordiazepoxide-clidinium], Loperamide hcl, Metaxalone, Penicillins,  Sulfa antibiotics, and Tetracyclines & related    Review of Systems   Review of Systems  Unable to perform ROS: Mental status change  Neurological:  Positive for seizures.    Physical Exam Updated Vital Signs BP (!) 137/52   Pulse (!) 40   Temp (!) 97.2 F (36.2 C) (Oral)   Resp (!) 9   SpO2 100%  Physical Exam Vitals and nursing note reviewed.  Constitutional:      General: He is not in acute distress.    Appearance: He is well-developed.  HENT:     Head: Normocephalic and atraumatic.  Eyes:     Conjunctiva/sclera: Conjunctivae normal.  Cardiovascular:     Rate and Rhythm: Normal  rate and regular rhythm.     Heart sounds: No murmur heard. Pulmonary:     Effort: Pulmonary effort is normal. No respiratory distress.     Breath sounds: Normal breath sounds.     Comments: labored Abdominal:     Palpations: Abdomen is soft.     Tenderness: There is no abdominal tenderness.  Musculoskeletal:        General: No swelling.     Cervical back: Neck supple.  Skin:    General: Skin is warm and dry.     Capillary Refill: Capillary refill takes less than 2 seconds.     Coloration: Skin is pale.  Neurological:     General: No focal deficit present.     Mental Status: He is alert.     Comments: Alert to self only  Psychiatric:        Mood and Affect: Mood normal.     ED Results / Procedures / Treatments   Labs (all labs ordered are listed, but only abnormal results are displayed) Labs Reviewed  CBC WITH DIFFERENTIAL/PLATELET - Abnormal; Notable for the following components:      Result Value   RBC 3.41 (*)    Hemoglobin 11.5 (*)    HCT 35.6 (*)    MCV 104.4 (*)    Platelets 125 (*)    Lymphs Abs 0.4 (*)    All other components within normal limits  COMPREHENSIVE METABOLIC PANEL - Abnormal; Notable for the following components:   Potassium 3.4 (*)    CO2 18 (*)    Glucose, Bld 211 (*)    BUN 25 (*)    Creatinine, Ser 1.39 (*)    Total Protein 6.1 (*)    AST 92 (*)    ALT 50 (*)    Alkaline Phosphatase 150 (*)    GFR, Estimated 47 (*)    All other components within normal limits  TROPONIN I (HIGH SENSITIVITY) - Abnormal; Notable for the following components:   Troponin I (High Sensitivity) 214 (*)    All other components within normal limits  MAGNESIUM  I-STAT CHEM 8, ED  CBG MONITORING, ED  TROPONIN I (HIGH SENSITIVITY)    EKG EKG Interpretation  Date/Time:  Friday February 28 2022 20:11:00 EDT Ventricular Rate:  41 PR Interval:  47 QRS Duration: 142 QT Interval:  682 QTC Calculation: 564 R Axis:   -70 Text Interpretation: Complete AV block  with wide QRS complex Left bundle branch block complete AV block new since previous Confirmed by Richardean Canal [phone]) on 02/28/2022 8:55:53 PM  Radiology DG Chest Port 1 View  Result Date: 02/28/2022 CLINICAL DATA:  Chest pain, cardiac arrhythmia, seizures EXAM: PORTABLE CHEST 1 VIEW COMPARISON:  09/30/2021 FINDINGS: Transverse diameter of heart is increased. Central  pulmonary vessels are prominent. Increased interstitial markings are seen in right parahilar region and both lower lung fields. There is blunting of right lateral CP angle. There is no pneumothorax. IMPRESSION: Increased interstitial markings in right parahilar region and both lower lung fields may suggest scarring or pneumonia. Small right pleural effusion. Electronically Signed   By: Elmer Picker M.D.   On: 02/28/2022 19:24    Procedures Procedures    Medications Ordered in ED Medications  potassium chloride 10 mEq in 100 mL IVPB (10 mEq Intravenous New Bag/Given 02/28/22 2312)  ondansetron (ZOFRAN) injection 4 mg (4 mg Intravenous Given 02/28/22 1917)  amiodarone (NEXTERONE) 1.8 mg/mL load via infusion 150 mg (150 mg Intravenous Bolus from Bag 02/28/22 1920)  ondansetron (ZOFRAN) injection 4 mg (4 mg Intravenous Given 02/28/22 2155)  lactated ringers bolus 1,000 mL (1,000 mLs Intravenous New Bag/Given 02/28/22 2210)  magnesium sulfate IVPB 2 g 50 mL (0 g Intravenous Stopped 02/28/22 2311)    ED Course/ Medical Decision Making/ A&P Clinical Course as of 02/28/22 2342  Fri Feb 28, 2022  2101 Family on way from incorrect hospital. Plan to page cardiology when family arrives to determine plan.   [ML]    Clinical Course User Index [ML] Rosine Abe, MD                           Medical Decision Making Amount and/or Complexity of Data Reviewed Labs: ordered. Radiology: ordered. ECG/medicine tests: ordered.  Risk Prescription drug management. Decision regarding hospitalization.   88 year old male with past medical  history of hypertension, bipolar disorder, dementia with behavioral disturbance, bradycardia, schizoaffective disorder, CAD presenting via EMS from a nursing facility.  Initially called out for nausea and vomiting.  When EMS arrived patient was found to be intermittently in V. tach.  During these episodes he did become less responsive. Approximately 10 minutes prior to arrival patient had 2 back-to-back episodes of V. tach during which he was defibrillated.  They then started an amiodarone bolus However on arrival initial IV was noted to be infiltrated so patient did not receive the amiodarone bolus.  Differential diagnosis includes electrolyte abnormalities, ACS, arrhythmia, aspiration, pneumonia, infection.  Patient was given amiodarone bolus and then started on an infusion. Initial EKG noted to have bigeminy, irregular P waves, not able to determine rhythm. Repeat EKG concerning for complete heart block.  Labs reviewed.  No leukocytosis or significant anemia. Troponin elevated to 14.  Chest x-ray does show increased interstitial markings along the entire right side and comparison to the left, with a small right-sided pleural effusion.  Due to concern for complete heart block, discussed with cardiology.  Cardiology team evaluated the patient and had a extensive discussion with family members at bedside.  Patient's 2 adult children were at bedside.  Decision was made to not proceed with pacemaker any basic procedures.  Amiodarone was discontinued.  I also discussed this with the family members.  Patient's CODE STATUS was changed to DNR, family numbers do not want CPR, intubation, or defibrillation.  They voiced understanding of this change.  All questions answered.  Patient's 2 adult children are, patient's son Andre Marsh and daughter Andre Marsh.   Patient was then admitted to the hospitalist service with plans for palliative/hospice consult in the morning.         Final Clinical  Impression(s) / ED Diagnoses Final diagnoses:  Complete heart block ([place])    Rx / DC Orders ED  Discharge Orders     None         Rosine Abe, MD 02/28/22 2342    Drenda Freeze, MD 03/01/22 1537

## 2022-03-01 DIAGNOSIS — Z882 Allergy status to sulfonamides status: Secondary | ICD-10-CM | POA: Diagnosis not present

## 2022-03-01 DIAGNOSIS — I714 Abdominal aortic aneurysm, without rupture, unspecified: Secondary | ICD-10-CM | POA: Diagnosis present

## 2022-03-01 DIAGNOSIS — Z66 Do not resuscitate: Secondary | ICD-10-CM

## 2022-03-01 DIAGNOSIS — I10 Essential (primary) hypertension: Secondary | ICD-10-CM | POA: Diagnosis present

## 2022-03-01 DIAGNOSIS — Z515 Encounter for palliative care: Secondary | ICD-10-CM

## 2022-03-01 DIAGNOSIS — F319 Bipolar disorder, unspecified: Secondary | ICD-10-CM | POA: Diagnosis present

## 2022-03-01 DIAGNOSIS — Z885 Allergy status to narcotic agent status: Secondary | ICD-10-CM | POA: Diagnosis not present

## 2022-03-01 DIAGNOSIS — N400 Enlarged prostate without lower urinary tract symptoms: Secondary | ICD-10-CM | POA: Diagnosis present

## 2022-03-01 DIAGNOSIS — I442 Atrioventricular block, complete: Secondary | ICD-10-CM | POA: Diagnosis not present

## 2022-03-01 DIAGNOSIS — Z789 Other specified health status: Secondary | ICD-10-CM | POA: Diagnosis not present

## 2022-03-01 DIAGNOSIS — Z888 Allergy status to other drugs, medicaments and biological substances status: Secondary | ICD-10-CM | POA: Diagnosis not present

## 2022-03-01 DIAGNOSIS — I2510 Atherosclerotic heart disease of native coronary artery without angina pectoris: Secondary | ICD-10-CM | POA: Diagnosis present

## 2022-03-01 DIAGNOSIS — R001 Bradycardia, unspecified: Secondary | ICD-10-CM | POA: Diagnosis present

## 2022-03-01 DIAGNOSIS — Z8249 Family history of ischemic heart disease and other diseases of the circulatory system: Secondary | ICD-10-CM | POA: Diagnosis not present

## 2022-03-01 DIAGNOSIS — F0283 Dementia in other diseases classified elsewhere, unspecified severity, with mood disturbance: Secondary | ICD-10-CM | POA: Diagnosis present

## 2022-03-01 DIAGNOSIS — G20 Parkinson's disease: Secondary | ICD-10-CM | POA: Diagnosis present

## 2022-03-01 DIAGNOSIS — G309 Alzheimer's disease, unspecified: Secondary | ICD-10-CM | POA: Diagnosis present

## 2022-03-01 DIAGNOSIS — R64 Cachexia: Secondary | ICD-10-CM | POA: Diagnosis present

## 2022-03-01 DIAGNOSIS — Z880 Allergy status to penicillin: Secondary | ICD-10-CM | POA: Diagnosis not present

## 2022-03-01 DIAGNOSIS — F419 Anxiety disorder, unspecified: Secondary | ICD-10-CM | POA: Diagnosis present

## 2022-03-01 DIAGNOSIS — I472 Ventricular tachycardia, unspecified: Secondary | ICD-10-CM

## 2022-03-01 DIAGNOSIS — E785 Hyperlipidemia, unspecified: Secondary | ICD-10-CM | POA: Diagnosis present

## 2022-03-01 DIAGNOSIS — R112 Nausea with vomiting, unspecified: Secondary | ICD-10-CM | POA: Diagnosis present

## 2022-03-01 DIAGNOSIS — R7401 Elevation of levels of liver transaminase levels: Secondary | ICD-10-CM | POA: Diagnosis present

## 2022-03-01 DIAGNOSIS — R569 Unspecified convulsions: Secondary | ICD-10-CM | POA: Diagnosis present

## 2022-03-01 LAB — TROPONIN I (HIGH SENSITIVITY): Troponin I (High Sensitivity): 236 ng/L (ref <18)

## 2022-03-01 MED ORDER — BIOTENE DRY MOUTH MT LIQD: 15.0000 mL | Freq: Two times a day (BID) | OROMUCOSAL | Status: DC

## 2022-03-01 MED ORDER — HALOPERIDOL LACTATE 5 MG/ML IJ SOLN: 2.5000 mg | INTRAMUSCULAR | Status: DC | PRN

## 2022-03-01 MED ORDER — MORPHINE SULFATE (PF) 2 MG/ML IV SOLN
2.0000 mg | INTRAVENOUS | Status: DC | PRN
  Administered 2022-03-01: 2 mg via INTRAVENOUS
  Filled 2022-03-01: qty 1

## 2022-03-01 MED ORDER — HYDROMORPHONE HCL 2 MG PO TABS: 2.0000 mg | ORAL_TABLET | Freq: Four times a day (QID) | ORAL | Status: DC | PRN

## 2022-03-01 MED ORDER — ONDANSETRON HCL 4 MG/2ML IJ SOLN: 4.0000 mg | Freq: Four times a day (QID) | INTRAMUSCULAR | Status: DC | PRN

## 2022-03-01 MED ORDER — DIPHENHYDRAMINE HCL 50 MG/ML IJ SOLN: 25.0000 mg | INTRAMUSCULAR | Status: DC | PRN

## 2022-03-01 MED ORDER — ONDANSETRON 4 MG PO TBDP: 4.0000 mg | ORAL_TABLET | Freq: Four times a day (QID) | ORAL | Status: DC | PRN

## 2022-03-01 MED ORDER — GLYCOPYRROLATE 0.2 MG/ML IJ SOLN: 0.2000 mg | INTRAMUSCULAR | Status: DC | PRN

## 2022-03-01 MED ORDER — LORAZEPAM 2 MG/ML IJ SOLN: 1.0000 mg | INTRAMUSCULAR | Status: DC | PRN

## 2022-03-01 MED ORDER — ACETAMINOPHEN 325 MG PO TABS
650.0000 mg | ORAL_TABLET | Freq: Four times a day (QID) | ORAL | Status: DC | PRN
  Administered 2022-03-01: 650 mg via ORAL
  Filled 2022-03-01: qty 2

## 2022-03-01 MED ORDER — IPRATROPIUM-ALBUTEROL 0.5-2.5 (3) MG/3ML IN SOLN
3.0000 mL | Freq: Four times a day (QID) | RESPIRATORY_TRACT | Status: DC
  Administered 2022-03-01 (×2): 3 mL via RESPIRATORY_TRACT
  Filled 2022-03-01 (×2): qty 3

## 2022-03-01 MED ORDER — SODIUM CHLORIDE 0.9 % IV SOLN: INTRAVENOUS | Status: DC

## 2022-03-01 MED ORDER — GLYCOPYRROLATE 1 MG PO TABS: 1.0000 mg | ORAL_TABLET | ORAL | Status: DC | PRN

## 2022-03-01 MED ORDER — ACETAMINOPHEN 650 MG RE SUPP: 650.0000 mg | Freq: Four times a day (QID) | RECTAL | Status: DC | PRN

## 2022-03-01 MED ORDER — POLYVINYL ALCOHOL 1.4 % OP SOLN: 1.0000 [drp] | Freq: Four times a day (QID) | OPHTHALMIC | Status: DC | PRN

## 2022-03-01 NOTE — ED Notes (Signed)
Family asked for assistance because the pt was feeling and behaving a bit agitated.  He needed to pee but has a condom catheter on.  Upon assessment pt was found to be hot to touch and had an oral temp of 99.5 and a rectal temp of 102.8.  Family assisted in boosting pt up in bed and I gave him 650 PO tylenol.

## 2022-03-01 NOTE — Progress Notes (Signed)
Manufacturing engineer [facility]) Hospital Liaison Note  Bed offered and accepted at [facility] for transfer today. Unit RN please call report to [phone] prior to patient leaving the unit. Please send signed DNR and paperwork with patient.   Please leave all IV access and ports in place.   Please feel free to call with any hospice questions or concerns. Thank you  Roselee Nova, [facility] Liaison [phone]

## 2022-03-01 NOTE — ED Notes (Signed)
Update given to [facility]) related to fever, tx with tylenol and mild agitation tx with morphine at family request.

## 2022-03-01 NOTE — Discharge Summary (Signed)
PatientPhysician Discharge Summary  Andre Marsh [id number] DOB: December 06, 1926 DOA: 02/28/2022  PCP: Edmonia Caprio, NP  Admit date: 02/28/2022 Discharge date: 03/01/2022    Admitted From: ALF Disposition: [facility]  Recommendations for Outpatient Follow-up:  Follow up with PCP in 1-2 weeks Please obtain BMP/CBC in one week Please follow up with your PCP on the following pending results: Unresulted Labs (From admission, onward)    None        Discharge Condition: Guarded CODE STATUS: DNR Diet recommendation: As pleased by patient  Subjective: Seen and examined.  Alert but not oriented.  Comfortable.  HPI: Andre Marsh is a 87 y.o. male with medical history significant of alzheimer's disease, AAA, BPH, CAD, depression, falls, HLD, HTN who presents from his memory care unit for vomiting, decreased awareness, and gaze diversion.  He was found to be going in and out of VTach.  Amiodarone was started, but the IV infiltrated, so unclear how much amiodarone he initially had on board.  He was also found to be in complete heart block by EKG.  Labs showed a low K, bicarb of 18, Cr of 1.39, elevated LFTs, Mag of 2.1.  TnI was 214.  H/H were 11 and 35.6.     ED Course: EDP, Dr. Truman Hayward discussed this patient with Cardiology and family.  Family (son and daughter) felt that PCM was not in patient's goals of care.  They requested patient be transitioned to DNR and admitted for further discussion with palliative and hospice care.    Brief/Interim Summary:  Complete heart block Ventricular tachycardia Bradycardia End of life care Patient comfortable.  Palliative care on board.  Palliative care and family decided to transfer the patient to [facility].  He is being discharged today.  He had very brief hospitalization.   Discharge plan was discussed with patient and/or family member and they verbalized understanding and agreed with it.  Discharge Diagnoses:  Principal Problem:   Complete  heart block ([place]) Active Problems:   Encounter for end of life care   V-tach [facility])   End of life care    Discharge Instructions   Allergies as of 03/01/2022       Reactions   Ciprofloxacin Hcl Nausea And Vomiting   Cocoa Other (See Comments)   NOT ON MAR   Codeine Other (See Comments)   Unknown; listed in MAR   Hydrocodone Other (See Comments)   Unknown; listed in MAR   Librax [chlordiazepoxide-clidinium] Other (See Comments)   Unknown; listed in MAR   Loperamide Hcl Other (See Comments)   Unknown; listed in [facility]   Metaxalone Other (See Comments)   Unknown; listed in MAR   Penicillins Other (See Comments)   Unknown; listed in MAR   Sulfa Antibiotics Nausea And Vomiting, Rash   Tetracyclines & Related Rash   NOT ON MAR        Medication List     TAKE these medications    acetaminophen 500 MG tablet Commonly known as: TYLENOL Take 1,000 mg by mouth 3 (three) times daily. FOR PAIN   aspirin 81 MG tablet Take 81 mg by mouth daily.   buPROPion 75 MG tablet Commonly known as: WELLBUTRIN Take 75 mg by mouth every evening.   busPIRone 10 MG tablet Commonly known as: BUSPAR Take 10 mg by mouth 3 (three) times daily.   diclofenac Sodium 1 % Gel Commonly known as: VOLTAREN Apply 2 g topically 2 (two) times daily. Apply to lower back  docusate sodium 100 MG capsule Commonly known as: COLACE Take 100 mg by mouth at bedtime.   finasteride 5 MG tablet Commonly known as: PROSCAR Take 5 mg by mouth every evening.   HYDROmorphone 2 MG tablet Commonly known as: Dilaudid Take 1 tablet (2 mg total) by mouth every 6 (six) hours as needed for severe pain.   lamoTRIgine 25 MG tablet Commonly known as: LAMICTAL Take 50 mg by mouth at bedtime.   loratadine 10 MG tablet Commonly known as: CLARITIN Take 10 mg by mouth daily. For allergies   metoprolol tartrate 25 MG tablet Commonly known as: LOPRESSOR Take 0.5 tablets (12.5 mg total) by mouth 2 (two) times  daily. What changed: when to take this   mirtazapine 30 MG tablet Commonly known as: REMERON Take 30 mg by mouth at bedtime.   nitroGLYCERIN 0.4 MG SL tablet Commonly known as: NITROSTAT Place 0.4 mg under the tongue every 5 (five) minutes as needed for chest pain. CALL 9-1-1 AFTER THREE (3) DOSES   ondansetron 4 MG tablet Commonly known as: ZOFRAN Take 4 mg by mouth every 8 (eight) hours as needed for nausea.   paliperidone 6 MG 24 hr tablet Commonly known as: INVEGA Take 6 mg by mouth daily.   pantoprazole 40 MG tablet Commonly known as: PROTONIX Take 40 mg by mouth daily.   psyllium 28 % packet Commonly known as: METAMUCIL SMOOTH TEXTURE Take 1 packet by mouth at bedtime.   senna 8.6 MG Tabs tablet Commonly known as: SENOKOT Take 1 tablet by mouth daily.   sertraline 100 MG tablet Commonly known as: ZOLOFT Take 200 mg by mouth daily.   tamsulosin 0.4 MG Caps capsule Commonly known as: FLOMAX Take 0.4 mg by mouth daily after supper. For enlarged prostate        Allergies  Allergen Reactions   Ciprofloxacin Hcl Nausea And Vomiting   Cocoa Other (See Comments)    NOT ON MAR   Codeine Other (See Comments)    Unknown; listed in MAR   Hydrocodone Other (See Comments)    Unknown; listed in MAR   Librax [Chlordiazepoxide-Clidinium] Other (See Comments)    Unknown; listed in MAR   Loperamide Hcl Other (See Comments)    Unknown; listed in [facility]   Metaxalone Other (See Comments)    Unknown; listed in MAR   Penicillins Other (See Comments)    Unknown; listed in MAR   Sulfa Antibiotics Nausea And Vomiting and Rash   Tetracyclines & Related Rash    NOT ON MAR    Consultations: Palliative care   Procedures/Studies: DG Chest Port 1 View  Result Date: 02/28/2022 CLINICAL DATA:  Chest pain, cardiac arrhythmia, seizures EXAM: PORTABLE CHEST 1 VIEW COMPARISON:  09/30/2021 FINDINGS: Transverse diameter of heart is increased. Central pulmonary vessels are prominent.  Increased interstitial markings are seen in right parahilar region and both lower lung fields. There is blunting of right lateral CP angle. There is no pneumothorax. IMPRESSION: Increased interstitial markings in right parahilar region and both lower lung fields may suggest scarring or pneumonia. Small right pleural effusion. Electronically Signed   By: Elmer Picker M.D.   On: 02/28/2022 19:24     Discharge Exam: Vitals:   03/01/22 0806 03/01/22 1045  BP: (!) 133/53   Pulse: (!) 35 (!) 32  Resp: 17 19  Temp:    SpO2: 97% 97%   Vitals:   03/01/22 0700 03/01/22 0715 03/01/22 0806 03/01/22 1045  BP:  (!) 135/51 Marland Kitchen)  133/53   Pulse:  (!) 32 (!) 35 (!) 32  Resp:  20 17 19   Temp: (!) 97.2 F (36.2 C)     TempSrc: Temporal     SpO2:  96% 97% 97%    General: Pt is alert, awake, not in acute distress Cardiovascular: RRR, S1/S2 +, no rubs, no gallops Respiratory: CTA bilaterally, no wheezing, no rhonchi Abdominal: Soft, NT, ND, bowel sounds + Extremities: no edema, no cyanosis    The results of significant diagnostics from this hospitalization (including imaging, microbiology, ancillary and laboratory) are listed below for reference.     Microbiology: No results found for this or any previous visit (from the past 240 hour(s)).   Labs: BNP (last 3 results) No results for input(s): "BNP" in the last 8760 hours. Basic Metabolic Panel: Recent Labs  Lab 02/28/22 1915  NA 142  K 3.4*  CL 111  CO2 18*  GLUCOSE 211*  BUN 25*  CREATININE 1.39*  CALCIUM 9.0  MG 2.1   Liver Function Tests: Recent Labs  Lab 02/28/22 1915  AST 92*  ALT 50*  ALKPHOS 150*  BILITOT 0.9  PROT 6.1*  ALBUMIN 3.8   No results for input(s): "LIPASE", "AMYLASE" in the last 168 hours. No results for input(s): "AMMONIA" in the last 168 hours. CBC: Recent Labs  Lab 02/28/22 1915  WBC 4.9  NEUTROABS 4.4  HGB 11.5*  HCT 35.6*  MCV 104.4*  PLT 125*   Cardiac Enzymes: No results for  input(s): "CKTOTAL", "CKMB", "CKMBINDEX", "TROPONINI" in the last 168 hours. BNP: Invalid input(s): "POCBNP" CBG: No results for input(s): "GLUCAP" in the last 168 hours. D-Dimer No results for input(s): "DDIMER" in the last 72 hours. Hgb A1c No results for input(s): "HGBA1C" in the last 72 hours. Lipid Profile No results for input(s): "CHOL", "HDL", "LDLCALC", "TRIG", "CHOLHDL", "LDLDIRECT" in the last 72 hours. Thyroid function studies No results for input(s): "TSH", "T4TOTAL", "T3FREE", "THYROIDAB" in the last 72 hours.  Invalid input(s): "FREET3" Anemia work up No results for input(s): "VITAMINB12", "FOLATE", "FERRITIN", "TIBC", "IRON", "RETICCTPCT" in the last 72 hours. Urinalysis    Component Value Date/Time   COLORURINE YELLOW 07/03/2021 2249   APPEARANCEUR CLEAR 07/03/2021 2249   LABSPEC >1.030 (H) 07/03/2021 2249   PHURINE 5.5 07/03/2021 2249   GLUCOSEU NEGATIVE 07/03/2021 2249   HGBUR NEGATIVE 07/03/2021 2249   BILIRUBINUR NEGATIVE 07/03/2021 2249   KETONESUR NEGATIVE 07/03/2021 2249   PROTEINUR NEGATIVE 07/03/2021 2249   UROBILINOGEN 0.2 07/03/2021 1640   NITRITE NEGATIVE 07/03/2021 2249   LEUKOCYTESUR NEGATIVE 07/03/2021 2249   Sepsis Labs Recent Labs  Lab 02/28/22 1915  WBC 4.9   Microbiology No results found for this or any previous visit (from the past 240 hour(s)).   Time coordinating discharge: Over 30 minutes  SIGNED:   Darliss Cheney, MD  [facility] 03/01/2022, 2:41 PM *Please note that this is a verbal dictation therefore any spelling or grammatical errors are due to the "[place] One" system interpretation. If 7PM-7AM, please contact night-coverage [email or web address]

## 2022-03-01 NOTE — ED Notes (Addendum)
Patient family at bedside. Patient awake enough to eat. Patient given sandwich. Patient awaiting transport to [facility]

## 2022-03-01 NOTE — Consult Note (Signed)
Consultation Note Date: 03/01/2022   Patient Name: Andre Marsh  DOB: [birth year]  MRN: [id number]  Age / Sex: 88 y.o., male  PCP: Ashley Royalty, NP Referring Physician: Hughie Closs, MD  Reason for Consultation: Terminal Care, "DNR, transition to comfort, no further interventions"  HPI/Patient Profile: 88 y.o. male  with past medical history of alzheimer's disease, AAA, BPH, CAD, depression, falls, HLD, HTN presented to ED on 02/28/22 from [facility] memory care center with staff concerns of seizure like activity and vomiting. Upon EMS arrival, patient was found with episodes of Vtach. Cardiology was consulted for complete heart block complicated by complex tachycardia requiring DCCV. After cardiology discussed with family, family choose not to pursue invasive cardiac procedures and transitioned him to full comfort care. Patient was already enrolled with [facility] hospice prior to arrival. Patient was admitted on 02/28/2022 for end of life care.  Clinical Assessment and Goals of Care: I have reviewed medical records including EPIC notes, labs, and imaging. Received report from primary RN - no acute concerns. RN notes patient's family went home about two hours ago to sleep as they had been at bedside all night.  Went to visit patient at bedside - no family/visitors present. Patient was lying in bed asleep - he does briefly wake to voice/gentle touch. He does not answer questions. No signs or non-verbal gestures of pain or discomfort noted. No respiratory distress, increased work of breathing, or secretions noted. Defibrillation pad removed from patient's chest. 15 second period of apnea noted. Bradycardic on monitor with HR in 30s. He is on 2L O2 [place].  Notified [facility] hosp[facility] liaison patient is in ED for comfort care - she will follow up with family to discuss options of hospital death vs residential hospice transfer vs  return to [place] with hospice. [facility] liaison aware patient is likely not stable for transfer; however would support family wishes if they would like him transferred.  Discussed with primary RN removing cardiac monitoring.  Primary Decision Maker: NEXT OF KIN    SUMMARY OF RECOMMENDATIONS   Continue full comfort measures  Continue DNR/DNI as previously documented Patient is already enrolled with [facility] hospice services - [facility] liaison notified he's in ED. They will reach out to discuss options with family of hospital death vs transfer to residential hospice facility vs return to [place]. Patient is likely not stable for transfer at this time; however, would support family wishes if they would like him transferred Added orders for EOL symptom management and to reflect full comfort measures, as well as discontinued orders that were not focused on comfort Unrestricted visitation orders were placed per current [place] COVID19 EOL visitation policy  Nursing to provide frequent assessments and administer PRN medications as clinically necessary to ensure EOL comfort PMT will continue to follow and support holistically  Symptom Management Morphine PRN pain/dyspnea/increased work of breathing/RR >25 Tylenol PRN pain/fever Biotin twice daily Benadryl PRN itching Robinul PRN secretions Haldol PRN agitation/delirium Ativan PRN anxiety/seizure/sleep/distress Zofran PRN nausea/vomiting Liquifilm Tears PRN dry eye    Code Status/Advance Care Planning: DNR  Palliative Prophylaxis:  Aspiration, Bowel Regimen, Delirium Protocol, Eye Care, Frequent Pain Assessment, Oral Care, and Turn Reposition  Additional Recommendations (Limitations, Scope, Preferences): Full Comfort Care  Psycho-social/Spiritual:  Desire for further Chaplaincy support:no Created space and opportunity for patient to express thoughts and feelings regarding patient's current medical situation.  Emotional support and  therapeutic listening provided.  Prognosis:  Hours - Days  Discharge Planning: To Be Determined  Primary Diagnoses: Present on Admission:  Complete heart block ([place])   I have reviewed the medical record, interviewed the patient and family, and examined the patient. The following aspects are pertinent.  Past Medical History:  Diagnosis Date   AAA (abdominal aortic aneurysm)    Alzheimer disease ([place])    Anxiety    Bipolar 1 disorder ([facility])    CAD (coronary artery disease)    a. patient reported nonobstructive CAD. b. patient reported cath 2014 @ [facility]   Cataract    bilateral   Depression    Fall    GERD (gastroesophageal reflux disease)    Hyperlipidemia    Hypertension    Paralysis agitans ([place])    Social History   Socioeconomic History   Marital status: Widowed    Spouse name: Not on file   Number of children: Not on file   Years of education: Not on file   Highest education level: Not on file  Occupational History   Not on file  Tobacco Use   Smoking status: Never   Smokeless tobacco: Never  Substance and Sexual Activity   Alcohol use: Yes    Comment: wine occasionally   Drug use: Not on file   Sexual activity: Not on file  Other Topics Concern   Not on file  Social History Narrative   Lives in a nursing home.  Has a daughter and a son.    Social Determinants of Health   Financial Resource Strain: Not on file  Food Insecurity: Not on file  Transportation Needs: Not on file  Physical Activity: Not on file  Stress: Not on file  Social Connections: Not on file   Family History  Problem Relation Age of Onset   Heart failure Mother    Pulmonary embolism Father    Scheduled Meds:  ipratropium-albuterol  3 mL Nebulization Q6H   Continuous Infusions:  sodium chloride Stopped (03/01/22 0738)   PRN Meds:.acetaminophen **OR** acetaminophen, diphenhydrAMINE, glycopyrrolate **OR** glycopyrrolate **OR** glycopyrrolate, haloperidol lactate,  HYDROmorphone, ondansetron **OR** ondansetron (ZOFRAN) IV, polyvinyl alcohol Medications Prior to Admission:  Prior to Admission medications   Medication Sig Start Date End Date Taking? Authorizing Provider  acetaminophen (TYLENOL) 500 MG tablet Take 1,000 mg by mouth 3 (three) times daily. FOR PAIN   Yes [provider]  aspirin 81 MG tablet Take 81 mg by mouth daily.   Yes [provider]  buPROPion (WELLBUTRIN) 75 MG tablet Take 75 mg by mouth every evening. 09/17/21  Yes [provider]  busPIRone (BUSPAR) 10 MG tablet Take 10 mg by mouth 3 (three) times daily. 09/15/21  Yes [provider]  diclofenac Sodium (VOLTAREN) 1 % GEL Apply 2 g topically 2 (two) times daily. Apply to lower back 09/12/21  Yes [provider]  docusate sodium (COLACE) 100 MG capsule Take 100 mg by mouth at bedtime.   Yes [provider]  finasteride (PROSCAR) 5 MG tablet Take 5 mg by mouth every evening. 09/09/21  Yes [provider]  HYDROmorphone (DILAUDID) 2 MG tablet Take 1 tablet (2 mg total) by mouth every 6 (six) hours as needed for severe pain. 07/03/21  Yes Sherwood Gambler, MD  lamoTRIgine (LAMICTAL) 25 MG tablet Take 50 mg by mouth at bedtime.   Yes [provider]  loratadine (CLARITIN) 10 MG tablet Take 10 mg by mouth daily. For allergies   Yes [provider]  metoprolol tartrate (LOPRESSOR) 25 MG tablet Take 0.5 tablets (12.5 mg total) by mouth  2 (two) times daily. Patient taking differently: Take 12.5 mg by mouth at bedtime. 07/18/16  Yes Sheikh, Omair Latif, DO  mirtazapine (REMERON) 30 MG tablet Take 30 mg by mouth at bedtime.   Yes [provider]  nitroGLYCERIN (NITROSTAT) 0.4 MG SL tablet Place 0.4 mg under the tongue every 5 (five) minutes as needed for chest pain. CALL 9-1-1 AFTER THREE (3) DOSES   Yes [provider]  ondansetron (ZOFRAN) 4 MG tablet Take 4 mg by mouth every 8 (eight) hours as needed for  nausea. 01/20/22  Yes [provider]  paliperidone (INVEGA) 6 MG 24 hr tablet Take 6 mg by mouth daily.   Yes [provider]  pantoprazole (PROTONIX) 40 MG tablet Take 40 mg by mouth daily. 09/15/21  Yes [provider]  psyllium (METAMUCIL SMOOTH TEXTURE) 28 % packet Take 1 packet by mouth at bedtime.   Yes [provider]  senna (SENOKOT) 8.6 MG TABS tablet Take 1 tablet by mouth daily.   Yes [provider]  sertraline (ZOLOFT) 100 MG tablet Take 200 mg by mouth daily. 09/23/21  Yes [provider]  tamsulosin (FLOMAX) 0.4 MG CAPS capsule Take 0.4 mg by mouth daily after supper. For enlarged prostate   Yes [provider]   Allergies  Allergen Reactions   Ciprofloxacin Hcl Nausea And Vomiting   Cocoa Other (See Comments)    NOT ON MAR   Codeine Other (See Comments)    Unknown; listed in MAR   Hydrocodone Other (See Comments)    Unknown; listed in MAR   Librax [Chlordiazepoxide-Clidinium] Other (See Comments)    Unknown; listed in MAR   Loperamide Hcl Other (See Comments)    Unknown; listed in [facility]   Metaxalone Other (See Comments)    Unknown; listed in MAR   Penicillins Other (See Comments)    Unknown; listed in MAR   Sulfa Antibiotics Nausea And Vomiting and Rash   Tetracyclines & Related Rash    NOT ON MAR   Review of Systems  Unable to perform ROS: Dementia    Physical Exam Vitals and nursing note reviewed.  Constitutional:      General: He is not in acute distress.    Appearance: He is ill-appearing.  Pulmonary:     Effort: No respiratory distress.  Skin:    General: Skin is warm and dry.  Neurological:     Mental Status: He is lethargic.     Motor: Weakness present.  Psychiatric:        Speech: He is noncommunicative.     Vital Signs: BP (!) 133/53   Pulse (!) 35   Temp (!) 97.2 F (36.2 C) (Temporal)   Resp 17   SpO2 97%  Pain Scale: Faces       SpO2: SpO2: 97 % O2 Device:SpO2: 97 % O2  Flow Rate: .O2 Flow Rate (L/min): 2 L/min  IO: Intake/output summary: No intake or output data in the 24 hours ending 03/01/22 0913  LBM:   Baseline Weight:   Most recent weight:       Palliative Assessment/Data: PPS 10%     Signed by: Lin Landsman, NP   Please contact Palliative Medicine Team phone at [phone] for questions and concerns.  For individual provider: See Amion  *Portions of this note are a verbal dictation therefore any spelling and/or grammatical errors are due to the "[place] One" system interpretation.

## 2022-03-01 NOTE — ED Notes (Signed)
DC instructions provided to PTAR.  Family at bedside for transfer and DC. Pt DC to [facility].

## 2022-03-01 NOTE — ED Notes (Signed)
Pt noted to be dropping sats to between 88-90%.  Pt seems to have some work of breathing but is not tachypneic. 02 increased to 3L [place] with some improvement to sats 91-93%.  Decision made to give breathing tx  a little early.

## 2022-03-01 NOTE — Progress Notes (Signed)
PROGRESS NOTE    Andre Marsh  DGL:[id number] DOB: [birth year] DOA: 02/28/2022 PCP: Edmonia Caprio, NP   Brief Narrative:  HPI: Andre Marsh is a 88 y.o. male with medical history significant of alzheimer's disease, AAA, BPH, CAD, depression, falls, HLD, HTN who presents from his memory care unit for vomiting, decreased awareness, and gaze diversion.  He was found to be going in and out of VTach.  Amiodarone was started, but the IV infiltrated, so unclear how much amiodarone he initially had on board.  He was also found to be in complete heart block by EKG.  Labs showed a low K, bicarb of 18, Cr of 1.39, elevated LFTs, Mag of 2.1.  TnI was 214.  H/H were 11 and 35.6.     ED Course: EDP, Dr. Truman Hayward discussed this patient with Cardiology and family.  Family (son and daughter) felt that PCM was not in patient's goals of care.  They requested patient be transitioned to DNR and admitted for further discussion with palliative and hospice care.  Assessment & Plan:   Principal Problem:   Complete heart block ([place]) Active Problems:   Encounter for end of life care   V-tach [facility])   End of life care  Complete heart block Ventricular tachycardia Bradycardia End of life care Patient comfortable.  Palliative care on board.  Family to decide whether to continue end-of-life care in the hospital or transfer to facility or hospice home.  Will defer to palliative care/hospice to discuss that with the family.  Appreciate palliative care.  DVT prophylaxis:    Code Status: DNR  Family Communication:  None present at bedside.  Status is: Observation The patient will require care spanning > 2 midnights and should be moved to inpatient because:    Estimated body mass index is 21.94 kg/m as calculated from the following:   Height as of 07/03/21: 5\' 2"  (1.575 m).   Weight as of 07/03/21: 54.4 kg.    Nutritional Assessment: There is no height or weight on file to calculate BMI.. Seen by dietician.  I  agree with the assessment and plan as outlined below: Nutrition Status:        . Skin Assessment: I have examined the patient's skin and I agree with the wound assessment as performed by the wound care RN as outlined below:    Consultants:  Palliative care  Procedures:    Antimicrobials:  Anti-infectives (From admission, onward)    None         Subjective: Patient seen and examined.  Patient alert but not oriented.  Objective: Vitals:   03/01/22 0700 03/01/22 0715 03/01/22 0806 03/01/22 1045  BP:  (!) 135/51 (!) 133/53   Pulse:  (!) 32 (!) 35 (!) 32  Resp:  20 17 19   Temp: (!) 97.2 F (36.2 C)     TempSrc: Temporal     SpO2:  96% 97% 97%   No intake or output data in the 24 hours ending 03/01/22 1254 There were no vitals filed for this visit.  Examination:  General exam: Appears calm and comfortable  Respiratory system: Clear to auscultation. Respiratory effort normal. Cardiovascular system: S1 & S2 heard, RRR. No JVD, murmurs, rubs, gallops or clicks. No pedal edema. Gastrointestinal system: Abdomen is nondistended, soft and nontender. No organomegaly or masses felt. Normal bowel sounds heard.  Data Reviewed: I have personally reviewed following labs and imaging studies  CBC: Recent Labs  Lab 02/28/22 1915  WBC 4.9  NEUTROABS 4.4  HGB 11.5*  HCT 35.6*  MCV 104.4*  PLT 0000000*   Basic Metabolic Panel: Recent Labs  Lab 02/28/22 1915  NA 142  K 3.4*  CL 111  CO2 18*  GLUCOSE 211*  BUN 25*  CREATININE 1.39*  CALCIUM 9.0  MG 2.1   GFR: CrCl cannot be calculated (Unknown ideal weight.). Liver Function Tests: Recent Labs  Lab 02/28/22 1915  AST 92*  ALT 50*  ALKPHOS 150*  BILITOT 0.9  PROT 6.1*  ALBUMIN 3.8   No results for input(s): "LIPASE", "AMYLASE" in the last 168 hours. No results for input(s): "AMMONIA" in the last 168 hours. Coagulation Profile: No results for input(s): "INR", "PROTIME" in the last 168 hours. Cardiac  Enzymes: No results for input(s): "CKTOTAL", "CKMB", "CKMBINDEX", "TROPONINI" in the last 168 hours. BNP (last 3 results) No results for input(s): "PROBNP" in the last 8760 hours. HbA1C: No results for input(s): "HGBA1C" in the last 72 hours. CBG: No results for input(s): "GLUCAP" in the last 168 hours. Lipid Profile: No results for input(s): "CHOL", "HDL", "LDLCALC", "TRIG", "CHOLHDL", "LDLDIRECT" in the last 72 hours. Thyroid Function Tests: No results for input(s): "TSH", "T4TOTAL", "FREET4", "T3FREE", "THYROIDAB" in the last 72 hours. Anemia Panel: No results for input(s): "VITAMINB12", "FOLATE", "FERRITIN", "TIBC", "IRON", "RETICCTPCT" in the last 72 hours. Sepsis Labs: No results for input(s): "PROCALCITON", "LATICACIDVEN" in the last 168 hours.  No results found for this or any previous visit (from the past 240 hour(s)).   Radiology Studies: DG Chest Port 1 View  Result Date: 02/28/2022 CLINICAL DATA:  Chest pain, cardiac arrhythmia, seizures EXAM: PORTABLE CHEST 1 VIEW COMPARISON:  09/30/2021 FINDINGS: Transverse diameter of heart is increased. Central pulmonary vessels are prominent. Increased interstitial markings are seen in right parahilar region and both lower lung fields. There is blunting of right lateral CP angle. There is no pneumothorax. IMPRESSION: Increased interstitial markings in right parahilar region and both lower lung fields may suggest scarring or pneumonia. Small right pleural effusion. Electronically Signed   By: Elmer Picker M.D.   On: 02/28/2022 19:24    Scheduled Meds:  antiseptic oral rinse  15 mL Mouth Rinse BID   ipratropium-albuterol  3 mL Nebulization Q6H   Continuous Infusions:   LOS: 0 days   Darliss Cheney, MD [facility]  03/01/2022, 12:54 PM   *Please note that this is a verbal dictation therefore any spelling or grammatical errors are due to the "[place]" system interpretation.  Please page via [place] and do not  message via secure chat for urgent patient care matters. Secure chat can be used for non urgent patient care matters.  How to contact the [facility] Attending or Consulting provider [place] or covering provider during after hours [place], for this patient?  Check the care team in [facility] and look for a) attending/consulting [facility] provider listed and b) the [facility] team listed. Page or secure chat [phone]. Log into [email or web address] and use [place]'s universal password to access. If you do not have the password, please contact the hospital operator. Locate the [facility] provider you are looking for under [facility] and page to a number that you can be directly reached. If you still have difficulty reaching the provider, please page the [facility] (Director on Call) for the Hospitalists listed on amion for assistance.

## 2022-03-16 DEATH — deceased

## 2023-09-23 IMAGING — CT CT CERVICAL SPINE W/O CM
3 of 4 series · 13 of 33 positions shown, 16 images · non-contrast
Comparison: CT head and C-spine 07/23/2015

CLINICAL DATA: Head trauma, minor (Age >= 65y) Mental status
change, unknown cause; Neck trauma (Age >= 65y). Fall



[Series 6: orthogonal bone · axial · 0.23mm/px · z∈[-269,-145]mm · 5 of 100 slices shown, 7 images]
[im 15/100  soft-tissue]
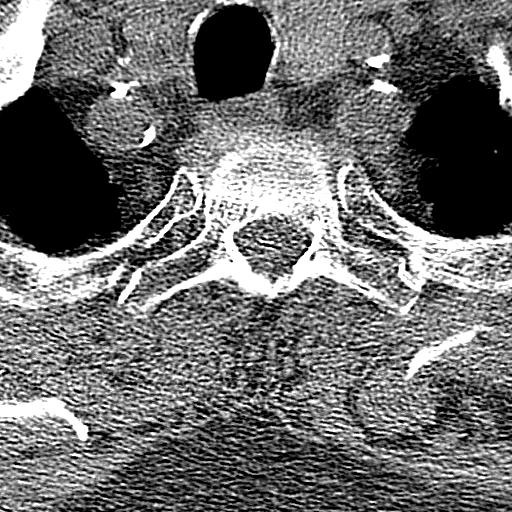
[im 15/100  bone]
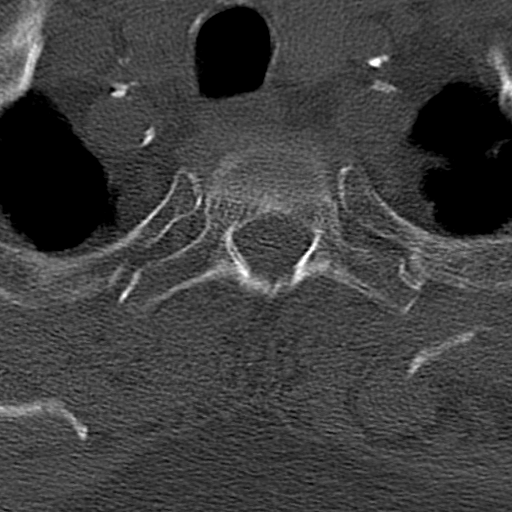
[im 29/100  bone]
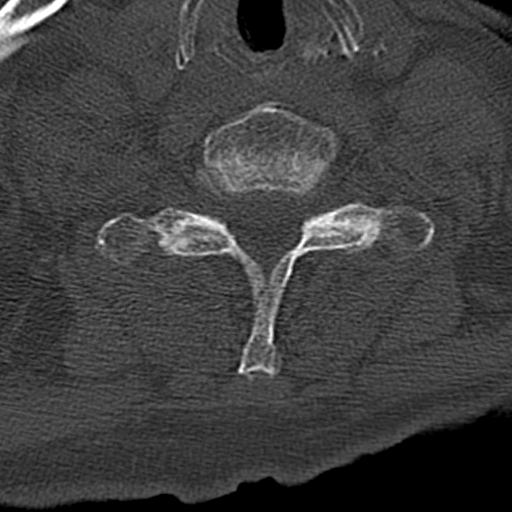
[im 57/100  bone]
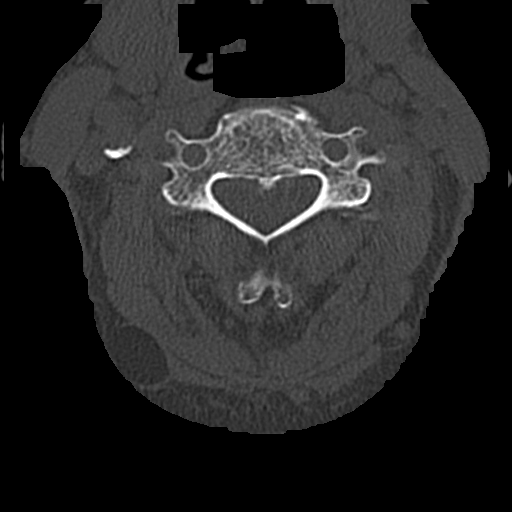
[im 71/100  bone]
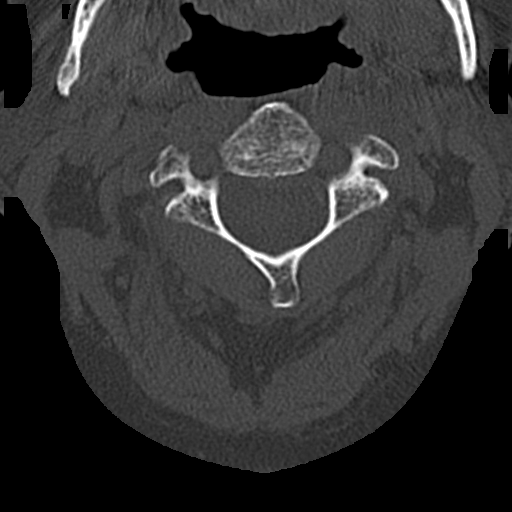
[im 85/100  soft-tissue]
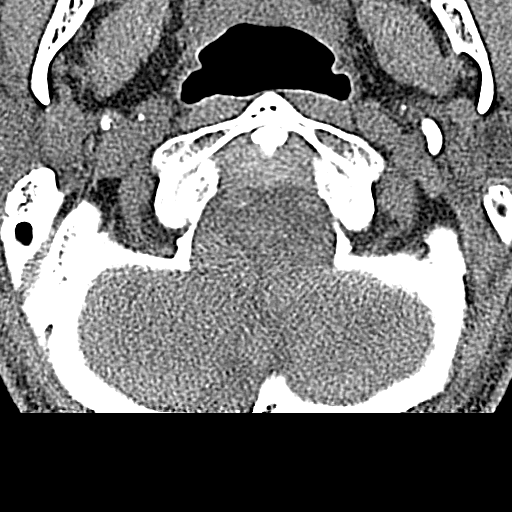
[im 85/100  bone]
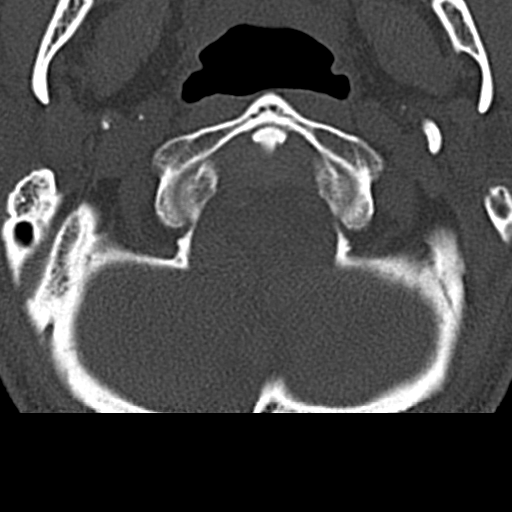

[Series 7: coronal bone · coronal · 0.25mm/px · 3 of 61 slices shown]
[im 14/61  bone]
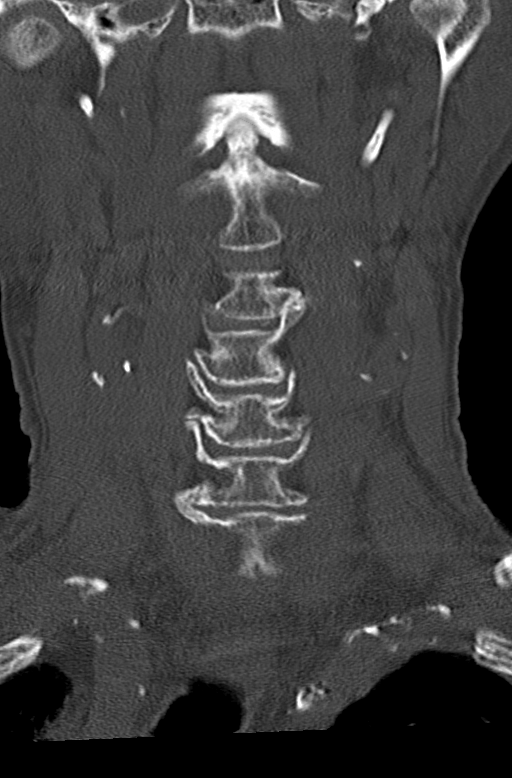
[im 25/61  bone]
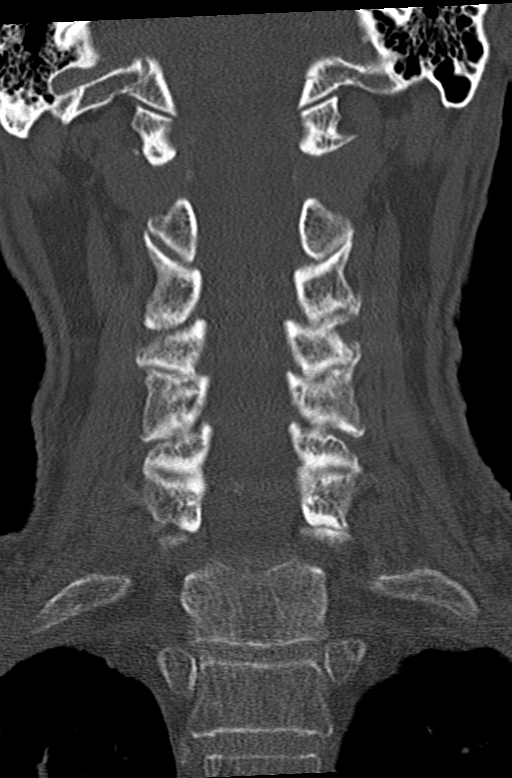
[im 36/61  bone]
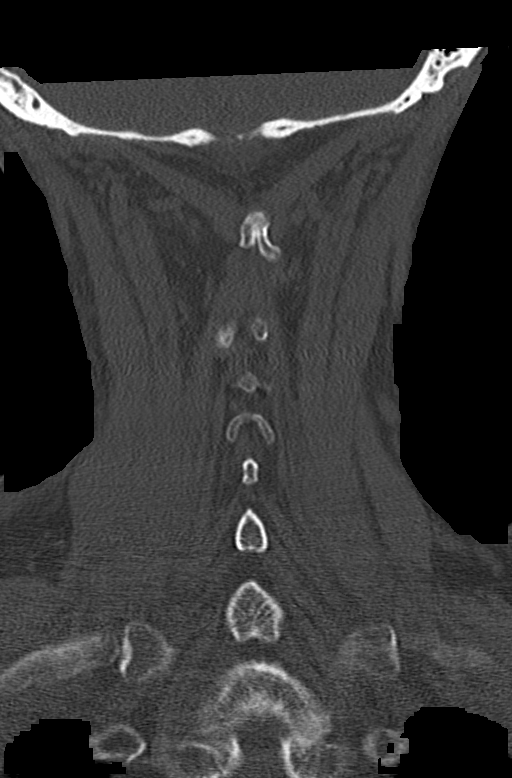

[Series 8: sagittal bone · sagittal · 0.35mm/px · 5 of 61 slices shown, 6 images]
[im 21/61  bone]
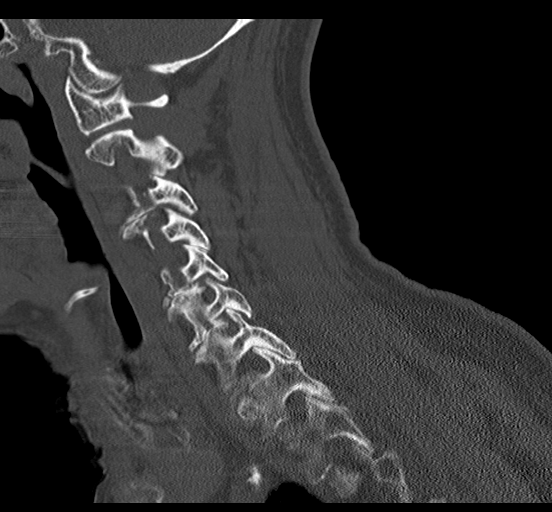
[im 26/61  bone]
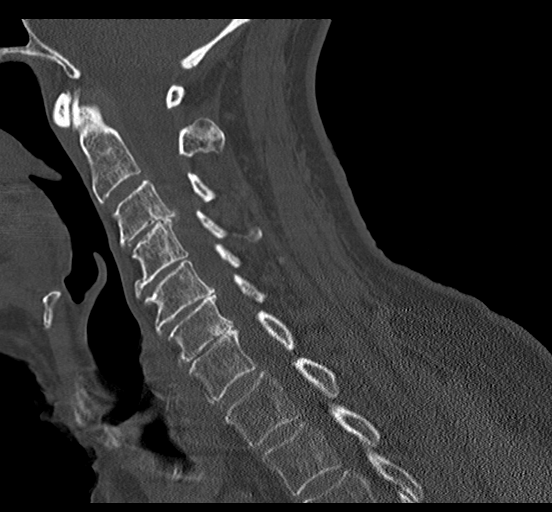
[im 31/61  soft-tissue]
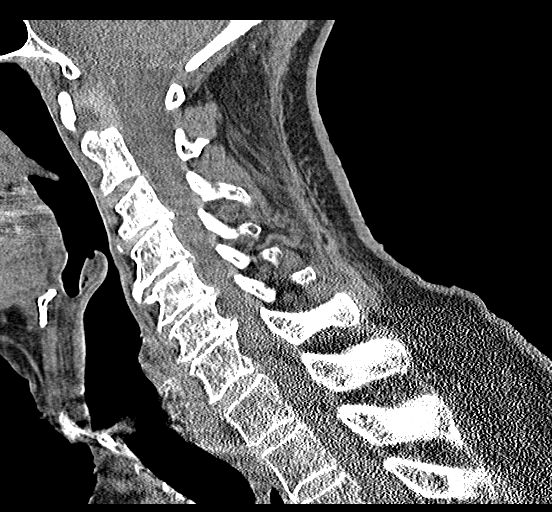
[im 31/61  bone]
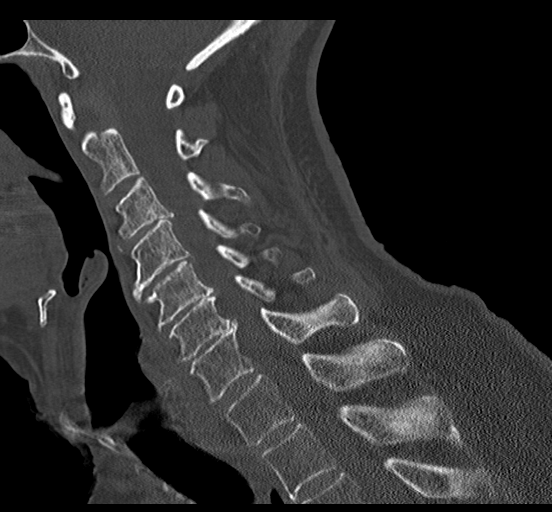
[im 36/61  bone]
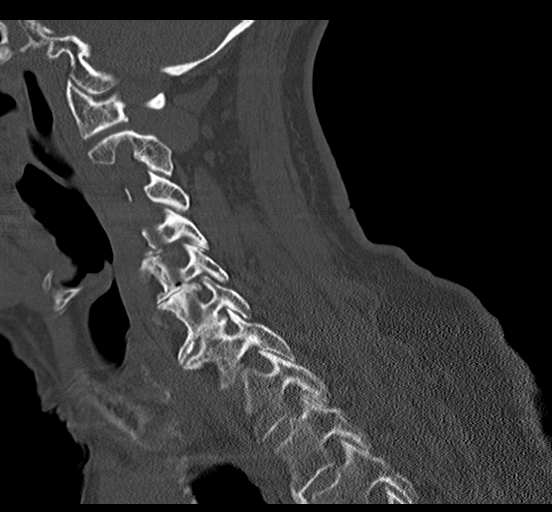
[im 41/61  bone]
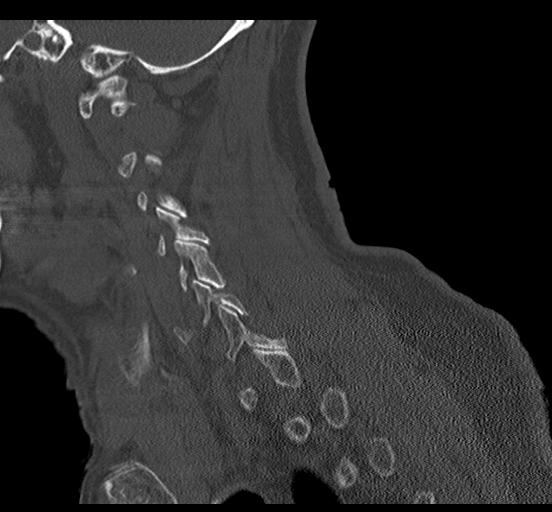

[13 of 33 positions shown; findings below may reference images not displayed]

FINDINGS: CT HEAD FINDINGS

BRAIN:
BRAIN
Cerebral ventricle sizes are concordant with the degree of cerebral
volume loss. Patchy and confluent areas of decreased attenuation are
noted throughout the deep and periventricular white matter of the
cerebral hemispheres bilaterally, compatible with chronic
microvascular ischemic disease.

No evidence of large-territorial acute infarction. No parenchymal
hemorrhage. No mass lesion. No extra-axial collection.

No mass effect or midline shift. No hydrocephalus. Basilar cisterns
are patent.

Vascular: No hyperdense vessel. Atherosclerotic calcifications are
present within the cavernous internal carotid arteries.

Skull: No acute fracture or focal lesion.

Sinuses/Orbits: Paranasal sinuses and mastoid air cells are clear.
Bilateral lens replacement. Otherwise the orbits are unremarkable.

Other: None.

CT CERVICAL SPINE FINDINGS

Alignment: Normal.

Skull base and vertebrae: Multilevel moderate severe degenerative
changes of the spine with associated multilevel severe osseous
neural foraminal stenosis. No acute fracture. No aggressive
appearing focal osseous lesion or focal pathologic process.

Soft tissues and spinal canal: No prevertebral fluid or swelling. No
visible canal hematoma.

Upper chest: Unremarkable.

Other: Atherosclerotic plaque of the carotid arteries within the
neck.
IMPRESSION: 1. No acute intracranial abnormality.
2. No acute displaced fracture or traumatic listhesis of the
cervical spine.
3. Multilevel moderate severe degenerative changes of the spine with
associated multilevel severe osseous neural foraminal stenosis.
